# Patient Record
Sex: Male | Born: 1940 | Race: White | Hispanic: No | Smoking: Former smoker
Health system: Southern US, Community
[De-identification: ages and names within clinical notes are randomized; demographics above are authoritative.]

## PROBLEM LIST (undated history)

## (undated) DIAGNOSIS — N289 Disorder of kidney and ureter, unspecified: Secondary | ICD-10-CM

## (undated) DIAGNOSIS — J449 Chronic obstructive pulmonary disease, unspecified: Secondary | ICD-10-CM

## (undated) DIAGNOSIS — E119 Type 2 diabetes mellitus without complications: Secondary | ICD-10-CM

## (undated) DIAGNOSIS — I350 Nonrheumatic aortic (valve) stenosis: Secondary | ICD-10-CM

## (undated) DIAGNOSIS — I639 Cerebral infarction, unspecified: Secondary | ICD-10-CM

## (undated) DIAGNOSIS — I10 Essential (primary) hypertension: Secondary | ICD-10-CM

## (undated) DIAGNOSIS — I480 Paroxysmal atrial fibrillation: Secondary | ICD-10-CM

## (undated) HISTORY — DX: Paroxysmal atrial fibrillation: I48.0

---

## 2013-10-21 ENCOUNTER — Encounter (HOSPITAL_BASED_OUTPATIENT_CLINIC_OR_DEPARTMENT_OTHER): Payer: Self-pay | Admitting: Emergency Medicine

## 2013-10-21 ENCOUNTER — Emergency Department (HOSPITAL_BASED_OUTPATIENT_CLINIC_OR_DEPARTMENT_OTHER): Payer: Medicare Other

## 2013-10-21 ENCOUNTER — Inpatient Hospital Stay (HOSPITAL_BASED_OUTPATIENT_CLINIC_OR_DEPARTMENT_OTHER)
Admission: EM | Admit: 2013-10-21 | Discharge: 2013-10-23 | DRG: 041 | Disposition: A | Discharge status: Home / Self Care | Payer: Medicare Other | Attending: Internal Medicine | Admitting: Internal Medicine

## 2013-10-21 DIAGNOSIS — Q231 Congenital insufficiency of aortic valve: Secondary | ICD-10-CM

## 2013-10-21 DIAGNOSIS — D72829 Elevated white blood cell count, unspecified: Secondary | ICD-10-CM | POA: Diagnosis present

## 2013-10-21 DIAGNOSIS — F17200 Nicotine dependence, unspecified, uncomplicated: Secondary | ICD-10-CM | POA: Diagnosis present

## 2013-10-21 DIAGNOSIS — Q211 Atrial septal defect: Secondary | ICD-10-CM | POA: Diagnosis present

## 2013-10-21 DIAGNOSIS — E119 Type 2 diabetes mellitus without complications: Secondary | ICD-10-CM | POA: Diagnosis present

## 2013-10-21 DIAGNOSIS — I350 Nonrheumatic aortic (valve) stenosis: Secondary | ICD-10-CM

## 2013-10-21 DIAGNOSIS — I359 Nonrheumatic aortic valve disorder, unspecified: Secondary | ICD-10-CM | POA: Diagnosis present

## 2013-10-21 DIAGNOSIS — J4489 Other specified chronic obstructive pulmonary disease: Secondary | ICD-10-CM | POA: Diagnosis present

## 2013-10-21 DIAGNOSIS — R4701 Aphasia: Secondary | ICD-10-CM | POA: Diagnosis present

## 2013-10-21 DIAGNOSIS — R404 Transient alteration of awareness: Secondary | ICD-10-CM | POA: Diagnosis present

## 2013-10-21 DIAGNOSIS — I10 Essential (primary) hypertension: Secondary | ICD-10-CM | POA: Diagnosis present

## 2013-10-21 DIAGNOSIS — I639 Cerebral infarction, unspecified: Secondary | ICD-10-CM | POA: Diagnosis present

## 2013-10-21 DIAGNOSIS — F29 Unspecified psychosis not due to a substance or known physiological condition: Secondary | ICD-10-CM | POA: Diagnosis present

## 2013-10-21 DIAGNOSIS — Q2111 Secundum atrial septal defect: Secondary | ICD-10-CM | POA: Diagnosis present

## 2013-10-21 DIAGNOSIS — I6350 Cerebral infarction due to unspecified occlusion or stenosis of unspecified cerebral artery: Principal | ICD-10-CM | POA: Diagnosis present

## 2013-10-21 DIAGNOSIS — Z9981 Dependence on supplemental oxygen: Secondary | ICD-10-CM

## 2013-10-21 DIAGNOSIS — M542 Cervicalgia: Secondary | ICD-10-CM | POA: Diagnosis present

## 2013-10-21 DIAGNOSIS — Q2112 Patent foramen ovale: Secondary | ICD-10-CM

## 2013-10-21 DIAGNOSIS — J449 Chronic obstructive pulmonary disease, unspecified: Secondary | ICD-10-CM | POA: Diagnosis present

## 2013-10-21 DIAGNOSIS — Q230 Congenital stenosis of aortic valve: Secondary | ICD-10-CM

## 2013-10-21 DIAGNOSIS — R4789 Other speech disturbances: Secondary | ICD-10-CM | POA: Diagnosis present

## 2013-10-21 DIAGNOSIS — Z79899 Other long term (current) drug therapy: Secondary | ICD-10-CM

## 2013-10-21 DIAGNOSIS — Z87891 Personal history of nicotine dependence: Secondary | ICD-10-CM

## 2013-10-21 DIAGNOSIS — Z7982 Long term (current) use of aspirin: Secondary | ICD-10-CM

## 2013-10-21 DIAGNOSIS — E785 Hyperlipidemia, unspecified: Secondary | ICD-10-CM | POA: Diagnosis present

## 2013-10-21 DIAGNOSIS — R51 Headache: Secondary | ICD-10-CM | POA: Diagnosis present

## 2013-10-21 DIAGNOSIS — I253 Aneurysm of heart: Secondary | ICD-10-CM

## 2013-10-21 HISTORY — DX: Cerebral infarction, unspecified: I63.9

## 2013-10-21 HISTORY — DX: Nonrheumatic aortic (valve) stenosis: I35.0

## 2013-10-21 HISTORY — DX: Chronic obstructive pulmonary disease, unspecified: J44.9

## 2013-10-21 HISTORY — DX: Essential (primary) hypertension: I10

## 2013-10-21 HISTORY — DX: Type 2 diabetes mellitus without complications: E11.9

## 2013-10-21 HISTORY — DX: Disorder of kidney and ureter, unspecified: N28.9

## 2013-10-21 LAB — URINE MICROSCOPIC-ADD ON

## 2013-10-21 LAB — URINALYSIS, ROUTINE W REFLEX MICROSCOPIC
Bilirubin Urine: NEGATIVE
GLUCOSE, UA: NEGATIVE mg/dL
HGB URINE DIPSTICK: NEGATIVE
Ketones, ur: NEGATIVE mg/dL
LEUKOCYTES UA: NEGATIVE
Nitrite: NEGATIVE
PROTEIN: 30 mg/dL — AB
Specific Gravity, Urine: 1.022 (ref 1.005–1.030)
Urobilinogen, UA: 0.2 mg/dL (ref 0.0–1.0)
pH: 7 (ref 5.0–8.0)

## 2013-10-21 LAB — CBC WITH DIFFERENTIAL/PLATELET
BASOS PCT: 0 % (ref 0–1)
Basophils Absolute: 0 10*3/uL (ref 0.0–0.1)
Eosinophils Absolute: 0 10*3/uL (ref 0.0–0.7)
Eosinophils Relative: 0 % (ref 0–5)
HCT: 41.9 % (ref 39.0–52.0)
HEMOGLOBIN: 14.3 g/dL (ref 13.0–17.0)
LYMPHS ABS: 1.1 10*3/uL (ref 0.7–4.0)
LYMPHS PCT: 9 % — AB (ref 12–46)
MCH: 30.6 pg (ref 26.0–34.0)
MCHC: 34.1 g/dL (ref 30.0–36.0)
MCV: 89.7 fL (ref 78.0–100.0)
MONOS PCT: 5 % (ref 3–12)
Monocytes Absolute: 0.6 10*3/uL (ref 0.1–1.0)
NEUTROS ABS: 10.7 10*3/uL — AB (ref 1.7–7.7)
Neutrophils Relative %: 86 % — ABNORMAL HIGH (ref 43–77)
Platelets: 157 10*3/uL (ref 150–400)
RBC: 4.67 MIL/uL (ref 4.22–5.81)
RDW: 15 % (ref 11.5–15.5)
WBC: 12.4 10*3/uL — ABNORMAL HIGH (ref 4.0–10.5)

## 2013-10-21 LAB — PROTIME-INR
INR: 0.95 (ref 0.00–1.49)
Prothrombin Time: 12.5 seconds (ref 11.6–15.2)

## 2013-10-21 LAB — COMPREHENSIVE METABOLIC PANEL
ALBUMIN: 3.9 g/dL (ref 3.5–5.2)
ALK PHOS: 79 U/L (ref 39–117)
ALT: 16 U/L (ref 0–53)
AST: 14 U/L (ref 0–37)
BILIRUBIN TOTAL: 0.9 mg/dL (ref 0.3–1.2)
BUN: 12 mg/dL (ref 6–23)
CO2: 26 meq/L (ref 19–32)
Calcium: 9.3 mg/dL (ref 8.4–10.5)
Chloride: 97 mEq/L (ref 96–112)
Creatinine, Ser: 1 mg/dL (ref 0.50–1.35)
GFR calc Af Amer: 83 mL/min — ABNORMAL LOW (ref >90)
GFR calc non Af Amer: 71 mL/min — ABNORMAL LOW (ref >90)
Glucose, Bld: 197 mg/dL — ABNORMAL HIGH (ref 70–99)
POTASSIUM: 4 meq/L (ref 3.7–5.3)
Sodium: 138 mEq/L (ref 137–147)
Total Protein: 7.2 g/dL (ref 6.0–8.3)

## 2013-10-21 LAB — TROPONIN I: Troponin I: <0.3 ng/mL (ref <0.30)

## 2013-10-21 LAB — ETHANOL: Alcohol, Ethyl (B): <11 mg/dL (ref 0–11)

## 2013-10-21 LAB — APTT: APTT: 28 s (ref 24–37)

## 2013-10-21 LAB — GLUCOSE, CAPILLARY
Glucose-Capillary: 177 mg/dL — ABNORMAL HIGH (ref 70–99)
Glucose-Capillary: 179 mg/dL — ABNORMAL HIGH (ref 70–99)

## 2013-10-21 LAB — CREATININE, SERUM
CREATININE: 0.97 mg/dL (ref 0.50–1.35)
GFR calc Af Amer: >90 mL/min (ref >90)
GFR calc non Af Amer: 79 mL/min — ABNORMAL LOW (ref >90)

## 2013-10-21 MED ORDER — MORPHINE SULFATE 4 MG/ML IJ SOLN
4.0000 mg | Freq: Once | INTRAMUSCULAR | Status: AC
  Administered 2013-10-21: 4 mg via INTRAVENOUS
  Filled 2013-10-21: qty 1

## 2013-10-21 MED ORDER — TRAMADOL HCL 50 MG PO TABS
50.0000 mg | ORAL_TABLET | Freq: Four times a day (QID) | ORAL | Status: DC | PRN
Start: 2013-10-21 — End: 2013-10-23
  Administered 2013-10-22 (×2): 50 mg via ORAL
  Filled 2013-10-21 (×2): qty 1

## 2013-10-21 MED ORDER — ASPIRIN 81 MG PO TABS: 81.0000 mg | ORAL_TABLET | Freq: Every day | ORAL | Status: DC

## 2013-10-21 MED ORDER — HYDRALAZINE HCL 20 MG/ML IJ SOLN: 10.0000 mg | Freq: Three times a day (TID) | INTRAMUSCULAR | Status: DC | PRN

## 2013-10-21 MED ORDER — IPRATROPIUM-ALBUTEROL 0.5-2.5 (3) MG/3ML IN SOLN
3.0000 mL | Freq: Once | RESPIRATORY_TRACT | Status: AC
  Administered 2013-10-21: 3 mL via RESPIRATORY_TRACT

## 2013-10-21 MED ORDER — ENOXAPARIN SODIUM 40 MG/0.4ML ~~LOC~~ SOLN
40.0000 mg | SUBCUTANEOUS | Status: DC
  Administered 2013-10-21 – 2013-10-22 (×2): 40 mg via SUBCUTANEOUS
  Filled 2013-10-21 (×3): qty 0.4

## 2013-10-21 MED ORDER — ACETAMINOPHEN 325 MG PO TABS
650.0000 mg | ORAL_TABLET | Freq: Four times a day (QID) | ORAL | Status: DC | PRN
  Administered 2013-10-23: 650 mg via ORAL
  Filled 2013-10-21: qty 2

## 2013-10-21 MED ORDER — IPRATROPIUM-ALBUTEROL 20-100 MCG/ACT IN AERS: 1.0000 | INHALATION_SPRAY | Freq: Four times a day (QID) | RESPIRATORY_TRACT | Status: DC

## 2013-10-21 MED ORDER — NICOTINE 7 MG/24HR TD PT24
7.0000 mg | MEDICATED_PATCH | Freq: Every day | TRANSDERMAL | Status: DC
  Administered 2013-10-21 – 2013-10-22 (×2): 7 mg via TRANSDERMAL
  Filled 2013-10-21 (×2): qty 1

## 2013-10-21 MED ORDER — SENNOSIDES-DOCUSATE SODIUM 8.6-50 MG PO TABS: 1.0000 | ORAL_TABLET | Freq: Every evening | ORAL | Status: DC | PRN

## 2013-10-21 MED ORDER — SERTRALINE HCL 100 MG PO TABS
100.0000 mg | ORAL_TABLET | Freq: Every day | ORAL | Status: DC
  Administered 2013-10-22 – 2013-10-23 (×2): 100 mg via ORAL
  Filled 2013-10-21 (×2): qty 1

## 2013-10-21 MED ORDER — ASPIRIN EC 81 MG PO TBEC
81.0000 mg | DELAYED_RELEASE_TABLET | Freq: Every day | ORAL | Status: DC
  Administered 2013-10-22 – 2013-10-23 (×2): 81 mg via ORAL
  Filled 2013-10-21 (×2): qty 1

## 2013-10-21 MED ORDER — TIOTROPIUM BROMIDE MONOHYDRATE 18 MCG IN CAPS
18.0000 ug | ORAL_CAPSULE | Freq: Every day | RESPIRATORY_TRACT | Status: DC
  Administered 2013-10-22: 18 ug via RESPIRATORY_TRACT
  Filled 2013-10-21: qty 5

## 2013-10-21 MED ORDER — IPRATROPIUM-ALBUTEROL 20-100 MCG/ACT IN AERS: 1.0000 | INHALATION_SPRAY | Freq: Four times a day (QID) | RESPIRATORY_TRACT | Status: DC | PRN

## 2013-10-21 MED ORDER — ONDANSETRON HCL 4 MG/2ML IJ SOLN
4.0000 mg | Freq: Once | INTRAMUSCULAR | Status: AC
  Administered 2013-10-21: 4 mg via INTRAVENOUS

## 2013-10-21 MED ORDER — MOMETASONE FURO-FORMOTEROL FUM 100-5 MCG/ACT IN AERO
2.0000 | INHALATION_SPRAY | Freq: Two times a day (BID) | RESPIRATORY_TRACT | Status: DC
  Administered 2013-10-21 – 2013-10-22 (×3): 2 via RESPIRATORY_TRACT
  Filled 2013-10-21: qty 8.8

## 2013-10-21 MED ORDER — THEOPHYLLINE ER 400 MG PO CP24: 400.0000 mg | ORAL_CAPSULE | Freq: Every day | ORAL | Status: DC

## 2013-10-21 MED ORDER — IPRATROPIUM-ALBUTEROL 0.5-2.5 (3) MG/3ML IN SOLN
RESPIRATORY_TRACT | Status: AC
  Administered 2013-10-21: 3 mL via RESPIRATORY_TRACT
  Filled 2013-10-21: qty 3

## 2013-10-21 MED ORDER — ONDANSETRON HCL 4 MG/2ML IJ SOLN: 4.0000 mg | Freq: Four times a day (QID) | INTRAMUSCULAR | Status: DC | PRN

## 2013-10-21 MED ORDER — INSULIN ASPART 100 UNIT/ML ~~LOC~~ SOLN
0.0000 [IU] | Freq: Three times a day (TID) | SUBCUTANEOUS | Status: DC
  Administered 2013-10-22 (×2): 2 [IU] via SUBCUTANEOUS
  Administered 2013-10-22: 1 [IU] via SUBCUTANEOUS
  Administered 2013-10-23: 2 [IU] via SUBCUTANEOUS

## 2013-10-21 MED ORDER — INSULIN GLARGINE 100 UNIT/ML ~~LOC~~ SOLN
12.0000 [IU] | Freq: Every day | SUBCUTANEOUS | Status: DC
  Administered 2013-10-21: 12 [IU] via SUBCUTANEOUS
  Filled 2013-10-21 (×2): qty 0.12

## 2013-10-21 MED ORDER — ASPIRIN 81 MG PO CHEW
324.0000 mg | CHEWABLE_TABLET | Freq: Once | ORAL | Status: AC
  Administered 2013-10-21: 324 mg via ORAL
  Filled 2013-10-21: qty 4

## 2013-10-21 MED ORDER — ONDANSETRON HCL 4 MG/2ML IJ SOLN
INTRAMUSCULAR | Status: AC
  Filled 2013-10-21: qty 2

## 2013-10-21 MED ORDER — THEOPHYLLINE ER 400 MG PO TB24
400.0000 mg | ORAL_TABLET | Freq: Every day | ORAL | Status: DC
  Administered 2013-10-22 – 2013-10-23 (×2): 400 mg via ORAL
  Filled 2013-10-21 (×2): qty 1

## 2013-10-21 MED ORDER — IPRATROPIUM-ALBUTEROL 0.5-2.5 (3) MG/3ML IN SOLN: 3.0000 mL | Freq: Four times a day (QID) | RESPIRATORY_TRACT | Status: DC | PRN

## 2013-10-21 NOTE — ED Notes (Signed)
Gave patient the Morphine, became nauseous and vomited.  Zofran given, vomiting stopped.  Patient verbalizes feeling much better.  Dr. Ethelda ChickJacubowitz notified.

## 2013-10-21 NOTE — ED Notes (Signed)
Patient lives by himself behind family. Was seen this morning appx 8am and was not making sense, incoherent in his speech. Walking slower than normal, no obvious neuro deficits at this time. Patient alert and oriented.

## 2013-10-21 NOTE — Consult Note (Signed)
Referring Physician: Dr. Rennis Chris    Chief Complaint: Headache with new onset visual changes and transient speech abnormality with confusion.  HPI: Brett Michael is an 73 y.o. male with a history of diabetes mellitus, hypertension and COPD, who was seen in the emergency room at [facility] earlier today with a complaint of headache and neck pain as well as changes in vision and confusion with speech output that was nonsensical. Speech abnormality has resolved and headache has improved. CT scan of the head was obtained which showed a left medial occipital area of hypoattenuation suspicious for subacute stroke. NIH stroke scale was 2. Patient was last known well at 8 PM on 10/20/2013. He's been taking aspirin daily.  LSN: 8 PM on 10/20/2013 tPA Given: No: Beyond time under for treatment consideration MRankin: 1  Past Medical History  Diagnosis Date  . Diabetes mellitus without complication   . COPD (chronic obstructive pulmonary disease)   . Hypertension   . Renal disorder     kidney stones    No family history on file.   Medications: I have reviewed the patient's current medications.  ROS: History obtained from patient, his son and his daughter-in-law  General ROS: negative for - chills, fatigue, fever, night sweats, weight gain or weight loss Psychological ROS: negative for - behavioral disorder, hallucinations, memory difficulties, mood swings or suicidal ideation Ophthalmic ROS: negative for - blurry vision, double vision, eye pain or loss of vision ENT ROS: negative for - epistaxis, nasal discharge, oral lesions, sore throat, tinnitus or vertigo Allergy and Immunology ROS: negative for - hives or itchy/watery eyes Hematological and Lymphatic ROS: negative for - bleeding problems, bruising or swollen lymph nodes Endocrine ROS: negative for - galactorrhea, hair pattern changes, polydipsia/polyuria or temperature intolerance Respiratory ROS: negative for - cough, hemoptysis, shortness of  breath or wheezing Cardiovascular ROS: negative for - chest pain, dyspnea on exertion, edema or irregular heartbeat Gastrointestinal ROS: negative for - abdominal pain, diarrhea, hematemesis, nausea/vomiting or stool incontinence Genito-Urinary ROS: negative for - dysuria, hematuria, incontinence or urinary frequency/urgency Musculoskeletal ROS: negative for - joint swelling or muscular weakness Neurological ROS: as noted in HPI Dermatological ROS: negative for rash and skin lesion changes  Physical Examination: Blood pressure 149/67, pulse 79, temperature 99.5 F (37.5 C), temperature source Rectal, resp. rate 18, height 5\' 6"  (1.676 m), weight 87.091 kg (192 lb), SpO2 91.00%.  Neurologic Examination: Mental Status: Alert, oriented, thought content appropriate.  Speech fluent without evidence of aphasia. Able to follow commands without difficulty. Cranial Nerves: II-right homonymous hemianopsia. III/IV/VI-Pupils were equal and reacted. Extraocular movements were full and conjugate.    V/VII-no facial numbness and no facial weakness. VIII-moderate difficulty with hearing X-normal speech and symmetrical palatal movement. Motor: 5/5 bilaterally with normal tone and bulk Sensory: Normal throughout. Deep Tendon Reflexes: 2+ and symmetric. Plantars: Flexor bilaterally Cerebellar: Normal finger-to-nose testing. Carotid auscultation: Normal  Dg Chest 2 View  10/21/2013   CLINICAL DATA:  Altered mental status  EXAM: CHEST  2 VIEW  COMPARISON:  None.  FINDINGS: There is underlying emphysematous change. There are calcified granulomas on the left. There is no edema or consolidation. Heart is mildly enlarged. The pulmonary vascularity reflects underlying emphysema. No adenopathy. There is atherosclerotic change in the aorta. There is syndesmotic change in the thoracic spine.  IMPRESSION: Underlying emphysematous change. No edema or consolidation. Mild cardiomegaly. Evidence of calcified granulomas on  the left.  There is evidence of syndesmotic change in the thoracic spine. 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   Electronically Signed   By: Bretta BangWilliam  Woodruff M.D.   On: 10/21/2013 14:22   Ct Head Wo Contrast  10/21/2013   CLINICAL DATA:  Altered mental status. Slurred speech. Diabetic with hypertension.  EXAM: CT HEAD WITHOUT CONTRAST  TECHNIQUE: Contiguous axial images were obtained from the base of the skull through the vertex without intravenous contrast.  COMPARISON:  None.  FINDINGS: Sinuses/Soft tissues: Right maxillary sinus mucous retention cyst or polyp. Mild motion degradation. Ethmoid air cell mucosal thickening. Clear mastoid air cells.  Intracranial: Advanced cerebral atrophy. Moderate low density in the periventricular white matter likely related to small vessel disease. Cerebellar atrophy which is also advanced. Cortically based hypoattenuation in the medial left occipital lobe, including on image 15/series 2. No hemorrhage, hydrocephalus, mass lesion, intra-axial, or extra-axial fluid collection.  IMPRESSION: 1. Advanced cerebral atrophy and small vessel ischemic change. 2. Mildly motion degraded exam. 3. Medial left occipital lobe cortically based hypoattenuation is suspicious for subacute infarct. These results were called by telephone at the time of interpretation on 10/21/2013 at 2:18 PM to Dr. Doug SouSAM JACUBOWITZ , who verbally acknowledged these results.   Electronically Signed   By: Jeronimo GreavesKyle  Talbot M.D.   On: 10/21/2013 14:20    Assessment: 73 y.o. male with a history of diabetes mellitus and hypertension presenting with subacute left medial occipital infarction as well as transient expressive aphasia.  Stroke Risk Factors - diabetes mellitus and hypertension  Plan: 1. HgbA1c, fasting lipid panel 2. MRI, MRA  of the brain without contrast 3. PT consult, OT consult, Speech consult 4. Echocardiogram 5. Carotid dopplers 6.  Prophylactic therapy-Antiplatelet med: Aspirin  7. Risk factor modification 8. Telemetry monitoring   C.R. Roseanne RenoStewart, MD [facility] [phone]  10/21/2013, 5:04 PM

## 2013-10-21 NOTE — ED Notes (Signed)
Dr. Ethelda ChickJacubowitz was notified of patient's vomiting episode and that Zofran was effective.  Also, I've requested respiratory therapist to evaluate patient for a nebulized treatment since patient was requesting to use his Combivent inhaler.

## 2013-10-21 NOTE — ED Notes (Signed)
Report given to Paramedic Ashley RoyaltyMatthews.  Ready for transport at this time.  They have assumed care of the patient.

## 2013-10-21 NOTE — ED Notes (Signed)
This RT called report to Kal. RRT at [facility].

## 2013-10-21 NOTE — ED Provider Notes (Signed)
CSN: [id number]     Arrival date & time 10/21/13  1250 History   First MD Initiated Contact with Patient 10/21/13 1331     Chief Complaint  Patient presents with  . Altered Mental Status   (Consider location/radiation/quality/duration/timing/severity/associated sxs/prior Treatment) HPI Patient complains of headache diffuse, neck pain, onset this morning which awakened him sleep the. Treated with Tylenol 8:30 AM today. He is dialyzed states that he was not making sense this morning. No known fever. No other associated symptoms. Nothing makes symptoms better or worse. Headache is moderate to severe present. Past Medical History  Diagnosis Date  . Diabetes mellitus without complication   . COPD (chronic obstructive pulmonary disease)   . Hypertension   . Renal disorder     kidney stones   History reviewed. No pertinent past surgical history. No family history on file. History  Substance Use Topics  . Smoking status: Former Games developer  . Smokeless tobacco: Not on file  . Alcohol Use: No    Review of Systems  Constitutional: Negative.   Respiratory: Negative.   Cardiovascular: Negative.   Gastrointestinal: Negative.   Musculoskeletal: Positive for neck pain.  Skin: Negative.   Neurological: Positive for headaches.  Psychiatric/Behavioral: Negative.   All other systems reviewed and are negative.    Allergies  Review of patient's allergies indicates not on file.  Home Medications   Current Outpatient Rx  Name  Route  Sig  Dispense  Refill  . amLODipine (NORVASC) 10 MG tablet   Oral   Take 10 mg by mouth daily.         Marland Kitchen aspirin 81 MG tablet   Oral   Take 81 mg by mouth daily.         . Fluticasone-Salmeterol (ADVAIR) 250-50 MCG/DOSE AEPB   Inhalation   Inhale 1 puff into the lungs 2 (two) times daily.         . insulin glargine (LANTUS) 100 UNIT/ML injection   Subcutaneous   Inject 12 Units into the skin at bedtime.         . metFORMIN (GLUCOPHAGE) 500 MG  tablet   Oral   Take by mouth 2 (two) times daily with a meal.         . quinapril (ACCUPRIL) 40 MG tablet   Oral   Take 40 mg by mouth at bedtime.         . sertraline (ZOLOFT) 100 MG tablet   Oral   Take 100 mg by mouth daily.         . theophylline (THEO-24) 400 MG 24 hr capsule   Oral   Take 400 mg by mouth daily.          BP 161/79  Pulse 88  Temp(Src) 99.8 F (37.7 C) (Rectal)  Resp 22  Ht 5\' 6"  (1.676 m)  Wt 192 lb (87.091 kg)  BMI 31.00 kg/m2  SpO2 93% Physical Exam  Nursing note and vitals reviewed. Constitutional: He is oriented to person, place, and time. He appears well-developed and well-nourished. No distress.  HENT:  Head: Normocephalic and atraumatic.  Eyes: Conjunctivae are normal. Pupils are equal, round, and reactive to light.  Neck: Neck supple. No tracheal deviation present. No thyromegaly present.  No signs of meningitis  Cardiovascular: Normal rate, regular rhythm and normal heart sounds.   No murmur heard. Pulmonary/Chest: Effort normal and breath sounds normal.  Abdominal: Soft. Bowel sounds are normal. He exhibits no distension. There is no tenderness.  Obese  Musculoskeletal: Normal range of motion. He exhibits no edema and no tenderness.  Neurological: He is alert and oriented to person, place, and time. He has normal reflexes. No cranial nerve deficit. Coordination normal.  Motor strength 5 over 5 overall her to shin normal DTRs symmetric bilaterally knee jerk ankle jerk and biceps his or her bilaterally.  Skin: Skin is warm and dry. No rash noted.  Psychiatric: He has a normal mood and affect. Thought content normal.    ED Course  Procedures (including critical care time) Labs Review Labs Reviewed  CBC WITH DIFFERENTIAL - Abnormal; Notable for the following:    WBC 12.4 (*)    Neutrophils Relative % 86 (*)    Neutro Abs 10.7 (*)    Lymphocytes Relative 9 (*)    All other components within normal limits  COMPREHENSIVE  METABOLIC PANEL  URINALYSIS, ROUTINE W REFLEX MICROSCOPIC  TROPONIN I   Imaging Review No results found.  EKG Interpretation    Date/Time:  Sunday October 21 2013 13:46:50 EST Ventricular Rate:  81 PR Interval:  170 QRS Duration: 86 QT Interval:  384 QTC Calculation: 446 R Axis:   -7 Text Interpretation:  Normal sinus rhythm Normal ECG No old tracing to compare Confirmed by Ethelda ChickJACUBOWITZ  MD, Kanoa Phillippi ([id number]) on 10/21/2013 2:17:26 PM           Results for orders placed during the hospital encounter of 10/21/13  CBC WITH DIFFERENTIAL      Result Value Range   WBC 12.4 (*) 4.0 - 10.5 K/uL   RBC 4.67  4.22 - 5.81 MIL/uL   Hemoglobin 14.3  13.0 - 17.0 g/dL   HCT 16.141.9  09.639.0 - 04.552.0 %   MCV 89.7  78.0 - 100.0 fL   MCH 30.6  26.0 - 34.0 pg   MCHC 34.1  30.0 - 36.0 g/dL   RDW 40.915.0  81.111.5 - 91.415.5 %   Platelets 157  150 - 400 K/uL   Neutrophils Relative % 86 (*) 43 - 77 %   Neutro Abs 10.7 (*) 1.7 - 7.7 K/uL   Lymphocytes Relative 9 (*) 12 - 46 %   Lymphs Abs 1.1  0.7 - 4.0 K/uL   Monocytes Relative 5  3 - 12 %   Monocytes Absolute 0.6  0.1 - 1.0 K/uL   Eosinophils Relative 0  0 - 5 %   Eosinophils Absolute 0.0  0.0 - 0.7 K/uL   Basophils Relative 0  0 - 1 %   Basophils Absolute 0.0  0.0 - 0.1 K/uL  COMPREHENSIVE METABOLIC PANEL      Result Value Range   Sodium 138  137 - 147 mEq/L   Potassium 4.0  3.7 - 5.3 mEq/L   Chloride 97  96 - 112 mEq/L   CO2 26  19 - 32 mEq/L   Glucose, Bld 197 (*) 70 - 99 mg/dL   BUN 12  6 - 23 mg/dL   Creatinine, Ser 7.821.00  0.50 - 1.35 mg/dL   Calcium 9.3  8.4 - 95.610.5 mg/dL   Total Protein 7.2  6.0 - 8.3 g/dL   Albumin 3.9  3.5 - 5.2 g/dL   AST 14  0 - 37 U/L   ALT 16  0 - 53 U/L   Alkaline Phosphatase 79  39 - 117 U/L   Total Bilirubin 0.9  0.3 - 1.2 mg/dL   GFR calc non Af Amer 71 (*) >90 mL/min   GFR calc Af Amer 83 (*) >  90 mL/min  TROPONIN I      Result Value Range   Troponin I <0.30  <0.30 ng/mL   Dg Chest 2 View  10/21/2013   CLINICAL DATA:   Altered mental status  EXAM: CHEST  2 VIEW  COMPARISON:  None.  FINDINGS: There is underlying emphysematous change. There are calcified granulomas on the left. There is no edema or consolidation. Heart is mildly enlarged. The pulmonary vascularity reflects underlying emphysema. No adenopathy. There is atherosclerotic change in the aorta. There is syndesmotic change in the thoracic spine.  IMPRESSION: Underlying emphysematous change. No edema or consolidation. Mild cardiomegaly. Evidence of calcified granulomas on the left.  There is evidence of syndesmotic change in the thoracic spine. 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   Electronically Signed   By: Bretta Bang M.D.   On: 10/21/2013 14:22   Ct Head Wo Contrast  10/21/2013   CLINICAL DATA:  Altered mental status. Slurred speech. Diabetic with hypertension.  EXAM: CT HEAD WITHOUT CONTRAST  TECHNIQUE: Contiguous axial images were obtained from the base of the skull through the vertex without intravenous contrast.  COMPARISON:  None.  FINDINGS: Sinuses/Soft tissues: Right maxillary sinus mucous retention cyst or polyp. Mild motion degradation. Ethmoid air cell mucosal thickening. Clear mastoid air cells.  Intracranial: Advanced cerebral atrophy. Moderate low density in the periventricular white matter likely related to small vessel disease. Cerebellar atrophy which is also advanced. Cortically based hypoattenuation in the medial left occipital lobe, including on image 15/series 2. No hemorrhage, hydrocephalus, mass lesion, intra-axial, or extra-axial fluid collection.  IMPRESSION: 1. Advanced cerebral atrophy and small vessel ischemic change. 2. Mildly motion degraded exam. 3. Medial left occipital lobe cortically based hypoattenuation is suspicious for subacute infarct. These results were called by telephone at the time of interpretation on 10/21/2013 at 2:18 PM to Dr. Doug Sou , who verbally  acknowledged these results.   Electronically Signed   By: Jeronimo Greaves M.D.   On: 10/21/2013 14:20    MDM  No diagnosis found. CT scan discussed with Dr. Reche Dixon. Patient requires MRI scan to further delineate CT findings. Code stroke not called as patient awakened with symptoms. Discussed with Dr.Vega who accepts the patient transfer . Aspirin ordered. Spoke with Dr. Roseanne Reno, neurology who will see patient in consultation Diagnosis #1stroke #2 hyperglycemia      Doug Sou, MD 10/21/13 1511

## 2013-10-21 NOTE — H&P (Signed)
[facility] History and Physical  Brett Michael WRU:[id number] DOB: 02/13/1941 DOA: 10/21/2013  Referring physician: Dr Ethelda Chick.  PCP: Orland Penman, MD   Chief Complaint: Headache, neck pain, confusion.    HPI: Brett Michael is a 73 y.o. male with PMH significant for COPD on home oxygen 3 L, Diabetes, hypertension who presents to ED complaining of headache, neck pain. Per family patient wake up around 8 am not feeling well, he was complaining of headache and left side neck pain, sharp in quality, 8/10. Family also notice that patient was confuse, talking no sense. No slurred speech, or vision changes. No history of fever. Patient appears to be back to baseline. Headache is better after pain medications.  He denies focal weakness, vision changes, chest pain, dyspnea, abdominal pain.  Patient had an episode of vomiting in the ED.  Evaluation in the ED ; CT head:  Advanced cerebral atrophy and small vessel ischemic change.  Mildly motion degraded exam. Medial left occipital lobe cortically based hypoattenuation is suspicious for subacute infarct.   Review of Systems:  Negative except as per HPI.   Past Medical History  Diagnosis Date  . Diabetes mellitus without complication   . COPD (chronic obstructive pulmonary disease)   . Hypertension   . Renal disorder     kidney stones   History reviewed. No pertinent past surgical history. Social History:  reports that he has quit smoking. He does not have any smokeless tobacco history on file. He reports that he does not drink alcohol. His drug history is not on file.  No Known Allergies  Family History: Father; MI, Mother diabetes.   Prior to Admission medications   Medication Sig Start Date End Date Taking? Authorizing Provider  amLODipine (NORVASC) 10 MG tablet Take 10 mg by mouth daily.   Yes Historical Provider, MD  aspirin 81 MG tablet Take 81 mg by mouth daily.   Yes Historical Provider, MD   Fluticasone-Salmeterol (ADVAIR) 250-50 MCG/DOSE AEPB Inhale 1 puff into the lungs 2 (two) times daily.   Yes Historical Provider, MD  insulin glargine (LANTUS) 100 UNIT/ML injection Inject 12 Units into the skin at bedtime.   Yes Historical Provider, MD  metFORMIN (GLUCOPHAGE) 500 MG tablet Take by mouth 2 (two) times daily with a meal.   Yes Historical Provider, MD  quinapril (ACCUPRIL) 40 MG tablet Take 40 mg by mouth at bedtime.   Yes Historical Provider, MD  sertraline (ZOLOFT) 100 MG tablet Take 100 mg by mouth daily.   Yes Historical Provider, MD  theophylline (THEO-24) 400 MG 24 hr capsule Take 400 mg by mouth daily.   Yes Historical Provider, MD   Physical Exam: Filed Vitals:   10/21/13 1515  BP: 135/64  Pulse: 83  Temp:   Resp: 16    BP 135/64  Pulse 83  Temp(Src) 99.5 F (37.5 C) (Rectal)  Resp 16  Ht 5\' 6"  (1.676 m)  Wt 87.091 kg (192 lb)  BMI 31.00 kg/m2  SpO2 88%  General:  Appears calm and comfortable Eyes: PERRL, normal lids, irises & conjunctiva ENT: grossly normal hearing, lips & tongue Neck: no LAD, masses or thyromegaly Cardiovascular: RRR, no m/r/g. No LE edema. Telemetry: SR, no arrhythmias  Respiratory: CTA bilaterally, no w/r/r. Normal respiratory effort. Abdomen: soft, ntnd Skin: no rash or induration seen on limited exam Musculoskeletal: grossly normal tone BUE/BLE Psychiatric: grossly normal mood and affect, speech fluent and appropriate Neurologic: grossly non-focal. Alert , oriented to place situation, time. Motor strength  5/5.           Labs on Admission:  Basic Metabolic Panel:  Recent Labs Lab 10/21/13 1330  NA 138  K 4.0  CL 97  CO2 26  GLUCOSE 197*  BUN 12  CREATININE 1.00  CALCIUM 9.3   Liver Function Tests:  Recent Labs Lab 10/21/13 1330  AST 14  ALT 16  ALKPHOS 79  BILITOT 0.9  PROT 7.2  ALBUMIN 3.9   No results found for this basename: LIPASE, AMYLASE,  in the last 168 hours No results found for this basename:  AMMONIA,  in the last 168 hours CBC:  Recent Labs Lab 10/21/13 1330  WBC 12.4*  NEUTROABS 10.7*  HGB 14.3  HCT 41.9  MCV 89.7  PLT 157   Cardiac Enzymes:  Recent Labs Lab 10/21/13 1330  TROPONINI <0.30    BNP (last 3 results) No results found for this basename: PROBNP,  in the last 8760 hours CBG:  Recent Labs Lab 10/21/13 1629  GLUCAP 177*    Radiological Exams on Admission: Dg Chest 2 View  10/21/2013   CLINICAL DATA:  Altered mental status  EXAM: CHEST  2 VIEW  COMPARISON:  None.  FINDINGS: There is underlying emphysematous change. There are calcified granulomas on the left. There is no edema or consolidation. Heart is mildly enlarged. The pulmonary vascularity reflects underlying emphysema. No adenopathy. There is atherosclerotic change in the aorta. There is syndesmotic change in the thoracic spine.  IMPRESSION: Underlying emphysematous change. No edema or consolidation. Mild cardiomegaly. Evidence of calcified granulomas on the left.  There is evidence of syndesmotic change in the thoracic spine. 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   Electronically Signed   By: Bretta BangWilliam  Woodruff M.D.   On: 10/21/2013 14:22   Ct Head Wo Contrast  10/21/2013   CLINICAL DATA:  Altered mental status. Slurred speech. Diabetic with hypertension.  EXAM: CT HEAD WITHOUT CONTRAST  TECHNIQUE: Contiguous axial images were obtained from the base of the skull through the vertex without intravenous contrast.  COMPARISON:  None.  FINDINGS: Sinuses/Soft tissues: Right maxillary sinus mucous retention cyst or polyp. Mild motion degradation. Ethmoid air cell mucosal thickening. Clear mastoid air cells.  Intracranial: Advanced cerebral atrophy. Moderate low density in the periventricular white matter likely related to small vessel disease. Cerebellar atrophy which is also advanced. Cortically based hypoattenuation in the medial left occipital lobe,  including on image 15/series 2. No hemorrhage, hydrocephalus, mass lesion, intra-axial, or extra-axial fluid collection.  IMPRESSION: 1. Advanced cerebral atrophy and small vessel ischemic change. 2. Mildly motion degraded exam. 3. Medial left occipital lobe cortically based hypoattenuation is suspicious for subacute infarct. These results were called by telephone at the time of interpretation on 10/21/2013 at 2:18 PM to Dr. Doug SouSAM JACUBOWITZ , who verbally acknowledged these results.   Electronically Signed   By: Jeronimo GreavesKyle  Talbot M.D.   On: 10/21/2013 14:20    EKG: Independently reviewed. Normal Sinus rhythm.   Assessment/Plan Active Problems:   Stroke  1-Headache, neck pain, confusion, aphasia: likely secondary to Stroke. No fever. CT head with hypoattenuation suspicious for subacute infarct.  -Admit to telemetry. Stroke work up. -MRI, MRA, Lipid panel, HB A1c, Carotid doppler, ECHO.  -PT, OT speech consult.  -Continue with aspirin.   2-COPD: continue with combivent PRN. Continue with Advair, spiriva. On home oxygen 3 L.  3-Hypertension: permissive HTN in setting of acute stroke. PRN hydralazine. Could consider start Norvasc on 2-09.  4-Diabetes; hold metformin while  inpatient. Continue with Lantus and SSI.  5-DVT prophylaxis; Lovenox.  6-Mild leukocytosis; likely reactive.    Code Status:  Family Communication: Care discussed with multiple family members at bedside.  Disposition Plan: expect 2 to 3 days inpatient.   Time spent: 75 minutes.   [facility] Pager [phone]

## 2013-10-21 NOTE — ED Notes (Signed)
[facility], RN aware.

## 2013-10-22 ENCOUNTER — Inpatient Hospital Stay (HOSPITAL_COMMUNITY): Payer: Medicare Other

## 2013-10-22 DIAGNOSIS — J449 Chronic obstructive pulmonary disease, unspecified: Secondary | ICD-10-CM

## 2013-10-22 DIAGNOSIS — I6340 Cerebral infarction due to embolism of unspecified cerebral artery: Secondary | ICD-10-CM

## 2013-10-22 DIAGNOSIS — I639 Cerebral infarction, unspecified: Secondary | ICD-10-CM | POA: Diagnosis present

## 2013-10-22 DIAGNOSIS — F17200 Nicotine dependence, unspecified, uncomplicated: Secondary | ICD-10-CM | POA: Diagnosis present

## 2013-10-22 DIAGNOSIS — I359 Nonrheumatic aortic valve disorder, unspecified: Secondary | ICD-10-CM

## 2013-10-22 LAB — CBC
HCT: 42.8 % (ref 39.0–52.0)
Hemoglobin: 14.7 g/dL (ref 13.0–17.0)
MCH: 30.8 pg (ref 26.0–34.0)
MCHC: 34.3 g/dL (ref 30.0–36.0)
MCV: 89.5 fL (ref 78.0–100.0)
PLATELETS: 135 10*3/uL — AB (ref 150–400)
RBC: 4.78 MIL/uL (ref 4.22–5.81)
RDW: 14.9 % (ref 11.5–15.5)
WBC: 10.4 10*3/uL (ref 4.0–10.5)

## 2013-10-22 LAB — BASIC METABOLIC PANEL
BUN: 15 mg/dL (ref 6–23)
CO2: 25 meq/L (ref 19–32)
Calcium: 9.4 mg/dL (ref 8.4–10.5)
Chloride: 100 mEq/L (ref 96–112)
Creatinine, Ser: 1.03 mg/dL (ref 0.50–1.35)
GFR calc Af Amer: 80 mL/min — ABNORMAL LOW (ref >90)
GFR calc non Af Amer: 69 mL/min — ABNORMAL LOW (ref >90)
Glucose, Bld: 164 mg/dL — ABNORMAL HIGH (ref 70–99)
POTASSIUM: 4.1 meq/L (ref 3.7–5.3)
SODIUM: 139 meq/L (ref 137–147)

## 2013-10-22 LAB — LIPID PANEL
CHOLESTEROL: 180 mg/dL (ref 0–200)
HDL: 35 mg/dL — ABNORMAL LOW (ref >39)
LDL Cholesterol: 110 mg/dL — ABNORMAL HIGH (ref 0–99)
Total CHOL/HDL Ratio: 5.1 RATIO
Triglycerides: 174 mg/dL — ABNORMAL HIGH (ref <150)
VLDL: 35 mg/dL (ref 0–40)

## 2013-10-22 LAB — TSH: TSH: 2.693 u[IU]/mL (ref 0.350–4.500)

## 2013-10-22 LAB — GLUCOSE, CAPILLARY
GLUCOSE-CAPILLARY: 141 mg/dL — AB (ref 70–99)
GLUCOSE-CAPILLARY: 160 mg/dL — AB (ref 70–99)
GLUCOSE-CAPILLARY: 161 mg/dL — AB (ref 70–99)
Glucose-Capillary: 219 mg/dL — ABNORMAL HIGH (ref 70–99)

## 2013-10-22 LAB — HEMOGLOBIN A1C
Hgb A1c MFr Bld: 6.7 % — ABNORMAL HIGH (ref <5.7)
MEAN PLASMA GLUCOSE: 146 mg/dL — AB (ref <117)

## 2013-10-22 MED ORDER — NICOTINE 14 MG/24HR TD PT24
14.0000 mg | MEDICATED_PATCH | Freq: Every day | TRANSDERMAL | Status: DC
  Administered 2013-10-22: 14 mg via TRANSDERMAL
  Filled 2013-10-22 (×2): qty 1

## 2013-10-22 MED ORDER — INSULIN GLARGINE 100 UNIT/ML ~~LOC~~ SOLN
12.0000 [IU] | SUBCUTANEOUS | Status: DC
  Filled 2013-10-22 (×2): qty 0.12

## 2013-10-22 MED ORDER — INSULIN GLARGINE 100 UNIT/ML ~~LOC~~ SOLN
6.0000 [IU] | Freq: Once | SUBCUTANEOUS | Status: AC
  Administered 2013-10-22: 6 [IU] via SUBCUTANEOUS
  Filled 2013-10-22: qty 0.06

## 2013-10-22 NOTE — Evaluation (Signed)
Physical Therapy Evaluation Patient Details Name: Brett Michael MRN: [id number] DOB: March 22, 1941 Today's Date: 10/22/2013 Time: 4098-1191 PT Time Calculation (min): 27 min  PT Assessment / Plan / Recommendation History of Present Illness  Headache, neck pain, confusion. MRI:  left occipital pole infarct  Clinical Impression  Pt mobility seems to be near baseline, however noted that pt's O2 sats decreased to 71% on 3L O2 during ambulation.  Pt placed on 4L O2 needing 5 mins to recover to 92%.  Ambulated again on 4L O2 with sats decreasing to 79%.  Pt telling this PT he often desats to 60's at home.  RN made aware of sats.  Will continue to follow.      PT Assessment  Patient needs continued PT services    Follow Up Recommendations  Home health PT;Supervision - Intermittent    Does the patient have the potential to tolerate intense rehabilitation      Barriers to Discharge        Equipment Recommendations  None recommended by PT    Recommendations for Other Services     Frequency Min 4X/week    Precautions / Restrictions Precautions Precautions: Fall Precaution Comments: Desats with minimal activity Restrictions Weight Bearing Restrictions: No   Pertinent Vitals/Pain Denied pain.  See comments on treatment session for O2 sats.        Mobility  Transfers Overall transfer level: Needs assistance Equipment used: None Transfers: Sit to/from Stand Sit to Stand: Supervision Ambulation/Gait Ambulation/Gait assistance: Supervision Ambulation Distance (Feet): 150 Feet (and 120) Assistive device: None Gait Pattern/deviations: Step-through pattern;Decreased stride length General Gait Details: pt moving well, but desats with ambualtion.  After 150' sats 71% on 3L O2.  pt placed on 4L O2 and able to recover up to 92% after sitting ~56mins.  Amb 120' on  4L O2 with sats decreased to 79%.  pt indicates he often desats to 60's at home on 3-3 1/2L O2.   Stairs: Yes Stairs assistance:  Supervision Stair Management: Two rails;Forwards Number of Stairs: 2 General stair comments: pt utilizes hand rails for support.   Modified Rankin (Stroke Patients Only) Pre-Morbid Rankin Score: Slight disability Modified Rankin: Moderately severe disability    Exercises     PT Diagnosis: Difficulty walking  PT Problem List: Decreased activity tolerance;Decreased balance;Decreased strength;Decreased mobility;Decreased cognition PT Treatment Interventions: DME instruction;Gait training;Stair training;Functional mobility training;Therapeutic activities;Therapeutic exercise;Balance training;Neuromuscular re-education;Patient/family education     PT Goals(Current goals can be found in the care plan section) Acute Rehab PT Goals Patient Stated Goal: Home PT Goal Formulation: With patient Time For Goal Achievement: 11/05/13 Potential to Achieve Goals: Good  Visit Information  Last PT Received On: 10/22/13 Assistance Needed: +1 History of Present Illness: Headache, neck pain, confusion. MRI:  left occipital pole infarct       Prior Functioning  Home Living Family/patient expects to be discharged to:: Private residence Living Arrangements: Alone Available Help at Discharge: Family;Available PRN/intermittently (live right in front of him (son and daughter-in-law)) Type of Home: House Home Access: Stairs to enter [employer] of Steps: 4 Entrance Stairs-Rails: Right;Left;Can reach both Home Layout: One level Home Equipment: Walker - 2 wheels;Cane - single point;Grab bars - tub/shower;Shower seat;Hand held shower head Prior Function Level of Independence: Independent Communication Communication: HOH Dominant Hand: Right    Cognition  Cognition Arousal/Alertness: Awake/alert Behavior During Therapy: WFL for tasks assessed/performed Overall Cognitive Status: Within Functional Limits for tasks assessed (7th grade level of education--poor reader)    Extremity/Trunk  Assessment Upper  Extremity Assessment Upper Extremity Assessment: Defer to OT evaluation Lower Extremity Assessment Lower Extremity Assessment: Generalized weakness   Balance Balance Overall balance assessment: Needs assistance Standing balance support: No upper extremity supported Standing balance-Leahy Scale: Fair  End of Session PT - End of Session Equipment Utilized During Treatment: Gait belt;Oxygen Activity Tolerance: Patient limited by fatigue Patient left: in chair;with call bell/phone within reach;with family/visitor present Nurse Communication: Mobility status (O2 sats)  GP     Annalaura Sauseda, Alison MurrayMegan F, PT [phone] 10/22/2013, 1:41 PM

## 2013-10-22 NOTE — Progress Notes (Signed)
Per PT, during session, pt sats dropped down into the 70's repeatedly with ambulation while on 3L via [place].  Pt reports that this is normal for him and that he drops down into the 60's at home with activity while on 3L [place].  Pt placed on 4 L via  after therapy session, sats now in the 90's.  Will monitor O2.

## 2013-10-22 NOTE — Progress Notes (Signed)
Echo Lab  2D Echocardiogram completed.  Brett Michael, RDCS 10/22/2013 8:46 AM

## 2013-10-22 NOTE — Progress Notes (Signed)
PT Cancellation Note  Patient Details Name: Brett Michael MRN: [id number] DOB: Oct 05, 1940   Cancelled Treatment:    Reason Eval/Treat Not Completed: Patient at procedure or test/unavailable.  Will try back another time.     Chanise Habeck, Alison MurrayMegan F 10/22/2013, 9:38 AM

## 2013-10-22 NOTE — Progress Notes (Signed)
UR complete.  Tra Wilemon RN, MSN 

## 2013-10-22 NOTE — Progress Notes (Addendum)
Stroke Team Progress Note  HISTORY Brett Michael is an 73 y.o. male with a history of diabetes mellitus, hypertension and COPD, who was [facility] today 10/21/2013 with a complaint of headache and neck pain as well as changes in vision and confusion with speech output that was nonsensical. Speech abnormality has resolved and headache has improved. CT scan of the head was obtained which showed a left medial occipital area of hypoattenuation suspicious for subacute stroke. NIH stroke scale was 2. Patient was last known well at 8 PM on 10/20/2013. He's been taking aspirin daily. He was beyond time for tPA treatment consideration. He was admitted for further evaluation and treatment.  SUBJECTIVE His daughter-in-law is at the bedside.  Overall he feels his condition is stable. He is sitting on the edge of the bed; working with PT.  OBJECTIVE Most recent Vital Signs: Filed Vitals:   10/22/13 0351 10/22/13 1012 10/22/13 1015 10/22/13 1020  BP: 138/63 168/81    Pulse: 72 73    Temp: 98 F (36.7 C) 97.5 F (36.4 C)    TempSrc: Oral Oral    Resp: 20 20    Height:      Weight:      SpO2: 93% 88% 91% 91%   CBG (last 3)   Recent Labs  10/21/13 1629 10/21/13 2106 10/22/13 0652  GLUCAP 177* 179* 141*    IV Fluid Intake:     MEDICATIONS  . aspirin EC  81 mg Oral Daily  . enoxaparin (LOVENOX) injection  40 mg Subcutaneous Q24H  . insulin aspart  0-9 Units Subcutaneous TID WC  . insulin glargine  12 Units Subcutaneous QHS  . mometasone-formoterol  2 puff Inhalation BID  . nicotine  7 mg Transdermal Daily  . sertraline  100 mg Oral Daily  . theophylline  400 mg Oral Daily  . tiotropium  18 mcg Inhalation Daily   PRN:  acetaminophen, hydrALAZINE, ipratropium-albuterol, ondansetron (ZOFRAN) IV, senna-docusate, traMADol  Diet:  Carb Control thin liquids Activity:  OOB with assistance DVT Prophylaxis:  Lovenox 40 mg sq daily   CLINICALLY SIGNIFICANT  STUDIES Basic Metabolic Panel:  Recent Labs Lab 10/21/13 1330 10/21/13 1840  NA 138  --   K 4.0  --   CL 97  --   CO2 26  --   GLUCOSE 197*  --   BUN 12  --   CREATININE 1.00 0.97  CALCIUM 9.3  --    Liver Function Tests:  Recent Labs Lab 10/21/13 1330  AST 14  ALT 16  ALKPHOS 79  BILITOT 0.9  PROT 7.2  ALBUMIN 3.9   CBC:  Recent Labs Lab 10/21/13 1330  WBC 12.4*  NEUTROABS 10.7*  HGB 14.3  HCT 41.9  MCV 89.7  PLT 157   Coagulation:  Recent Labs Lab 10/21/13 1330  LABPROT 12.5  INR 0.95   Cardiac Enzymes:  Recent Labs Lab 10/21/13 1330  TROPONINI <0.30   Urinalysis:  Recent Labs Lab 10/21/13 1748  COLORURINE YELLOW  LABSPEC 1.022  PHURINE 7.0  GLUCOSEU NEGATIVE  HGBUR NEGATIVE  BILIRUBINUR NEGATIVE  KETONESUR NEGATIVE  PROTEINUR 30*  UROBILINOGEN 0.2  NITRITE NEGATIVE  LEUKOCYTESUR NEGATIVE   Lipid Panel No results found for this basename: chol, trig, hdl, cholhdl, vldl, ldlcalc   HgbA1C  No results found for this basename: HGBA1C    Urine Drug Screen:   No results found for this basename: labopia, cocainscrnur, labbenz, amphetmu, thcu, labbarb  Alcohol Level:  Recent Labs Lab 10/21/13 1330  ETH <11     CT of the brain  10/21/2013    1. Advanced cerebral atrophy and small vessel ischemic change. 2. Mildly motion degraded exam. 3. Medial left occipital lobe cortically based hypoattenuation is suspicious for subacute infarct.  MRI of the brain  10/22/2013    1. Signal abnormality involving the left occipital pole most compatible with acute infarct. Cytotoxic edema but no significant mass effect. No intracranial hemorrhage. 2. Superimposed cerebral white matter signal changes, most commonly due to chronic small vessel disease.    MRA of the brain  10/22/2013   Intracranial MRA suggesting hyperdynamic left PCA and posterior left MCA flow, such as due to luxury perfusion phenomena. Dolichoectatic dominant distal left vertebral artery.    2D Echocardiogram  EF 50-55% with no source of embolus.   Carotid Doppler  No evidence of hemodynamically significant internal carotid artery stenosis. Vertebral artery flow is antegrade.   CXR  10/21/2013    Underlying emphysematous change. No edema or consolidation. Mild cardiomegaly. Evidence of calcified granulomas on the left.  There is evidence of syndesmotic change in the thoracic spine. 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     EKG  normal EKG, normal sinus rhythm. For complete results please see formal report.   Therapy Recommendations   Physical Exam   Pleasant elderly male not in distress.Awake alert. Afebrile. Head is nontraumatic. Neck is supple without bruit. Hearing is normal. Cardiac exam no murmur or gallop. Lungs are clear to auscultation. Distal pulses are well felt. Neurological Exam ;  Awake  Alert oriented x 3. Normal speech and language.eye movements full without nystagmus.fundi were not visualized. Vision acuity  appear normal.Fields show partial right heminaopsia. Hearing is normal. Palatal movements are normal. Face symmetric. Tongue midline. Normal strength, tone, reflexes and coordination. Normal sensation. Gait deferred. ASSESSMENT Mr. Brett Michael is a 73 y.o. male presenting with headache with new onset visual changes and transient speech abnormality with confusion. 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 Infarct felt to be embolic secondary to unknown source.  On aspirin 81 mg orally every day prior to admission. Now on aspirin 81 mg orally every day for secondary stroke prevention. Work up underway.  Diabetes, HgbA1c pending, goal < 7.0 hypertension COPD  Hospital day # 1  TREATMENT/PLAN  Continue aspirin 81 mg orally every day for secondary stroke prevention.  F/u lipids and HgbA1c  Therapy evals TEE to look for embolic source. Arranged with [facility] for  tomorrow.  If positive for PFO (patent foramen ovale), check bilateral lower extremity venous dopplers to rule out DVT as possible source of stroke. (I have made patient NPO after midnight tonight). If TEE negative, a [facility] electrophysiologist will consult and consider placement of an place implantable loop recorder to evaluate for atrial fibrillation as etiology of stroke. This has been explained to patient/family by Dr. Pearlean Brownie and they are agreeable.   Annie Main, MSN, RN, ANVP-BC, ANP-BC, [facility] Pager: [phone] 10/22/2013 10:32 AM  I have personally obtained a history, examined the patient, evaluated imaging results, and formulated the assessment and plan of care. I agree with the above. Delia Heady, MD

## 2013-10-22 NOTE — Evaluation (Signed)
Speech Language Pathology Evaluation Patient Details Name: Brett Michael MRN: [id number] DOB: 09/12/1941 Today's Date: 10/22/2013 Time: 1130-1150 SLP Time Calculation (min): 20 min  Problem List:  Patient Active Problem List   Diagnosis Date Noted  . Stroke 10/21/2013  . Unspecified essential hypertension 10/21/2013  . Type II or unspecified type diabetes mellitus without mention of complication, not stated as uncontrolled 10/21/2013  . COPD (chronic obstructive pulmonary disease) 10/21/2013   Past Medical History:  Past Medical History  Diagnosis Date  . Diabetes mellitus without complication   . COPD (chronic obstructive pulmonary disease)   . Hypertension   . Renal disorder     kidney stones   Past Surgical History: History reviewed. No pertinent past surgical history. HPI:  73 y.o. male with PMH significant for COPD on home oxygen 3 L, Diabetes, hypertension who presents to ED complaining of headache, neck pain. Per family patient wake up around 8 am not feeling well, he was complaining of headache and left side neck pain, sharp in quality, 8/10. Family also notice that patient was confuse, talking no sense. No slurred speech, or vision changes. No history of fever. Patient appears to be back to baseline. Headache is better after pain medications. CT head: Advanced cerebral atrophy and small vessel ischemic change. MRI signal abnormality involving the left occipital pole most compatible with acute infarct   Assessment / Plan / Recommendation Clinical Impression  Pt. exhibits functional cognition for verbal problem solving for home hypothetical situations, working memory, attention.  Possibly decreased anticipatory awareness and prospective memory.  He lives in a cabin in his son's backyard with assistance for management of meds (daughter-in-law fills pill box), appointments (uses calendar).  Pt. cooks using stove and microwave.   ST feels that he has appropriate assist and support  from family who is in very close proximity and recommend frequent checks on pt.  No ST recommended at this time.     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[employer])   Cognition  Overall Cognitive Status: Within Functional Limits for tasks assessed (7th grade level of education--poor reader) Orientation Level: Oriented X4 Memory: Impaired    Comprehension  Auditory Comprehension Overall Auditory Comprehension: Appears within functional limits for tasks assessed Visual Recognition/Discrimination Discrimination: Not tested Reading Comprehension Reading Status: Not tested    Expression Expression Primary Mode of Expression: Verbal Verbal Expression Overall Verbal Expression: Appears within functional limits for tasks assessed Written Expression Dominant Hand: Right   Oral / Motor Oral Motor/Sensory Function Overall Oral Motor/Sensory Function: Appears within functional limits for tasks assessed Motor Speech Overall Motor Speech: Appears within functional limits for tasks assessed        Royce MacadamiaLisa Willis Amaliya Whitelaw M.Ed [employer]SLP Pager [phone]  10/22/2013

## 2013-10-22 NOTE — Progress Notes (Signed)
The pt is for TEE 8:30 am with Dr Rennis GoldenHilty. I discussed risks and benefits of the procedure with the pt and family and they are in agreement. Orders written. 1/2 dose Lantus tonight.   Corine ShelterLUKE Onelia Cadmus PA-C 10/22/2013 4:31 PM

## 2013-10-22 NOTE — Evaluation (Signed)
Occupational Therapy Evaluation Patient Details Name: Brett Michael MRN: [id number] DOB: 04/25/1941 Today's Date: 10/22/2013 Time: 1019-1100 OT Time Calculation (min): 41 min  OT Assessment / Plan / Recommendation History of present illness Headache, neck pain, confusion. MRI:  left occipital pole infarct   Clinical Impression   This 73 yo male admitted and found to have above presents to acute OT with decreased vision in lower right quadrant both eyes. Will benefit from acute OT with follow up HHOT to assess safety with BADLs and IADLs in his own environment.    OT Assessment  Patient needs continued OT Services    Follow Up Recommendations  Home health OT             Frequency  Min 2X/week    Precautions / Restrictions Precautions Precautions: Fall Precaution Comments: Desats with minimal activity Restrictions Weight Bearing Restrictions: No   Pertinent Vitals/Pain 8/10 headache, RN made aware and brought pain meds in for him    ADL  Eating/Feeding: Independent Where Assessed - Eating/Feeding: Chair Grooming: Set up;Supervision/safety Where Assessed - Grooming: Unsupported standing Upper Body Bathing: Set up;Supervision/safety Where Assessed - Upper Body Bathing: Unsupported sit to stand Lower Body Bathing: Set up;Supervision/safety Where Assessed - Lower Body Bathing: Unsupported sit to stand Upper Body Dressing: Set up;Supervision/safety Where Assessed - Upper Body Dressing: Unsupported sit to stand Lower Body Dressing: Supervision/safety;Set up Where Assessed - Lower Body Dressing: Unsupported sit to stand Toilet Transfer: Supervision/safety Toilet Transfer Method: Sit to stand Toileting - Clothing Manipulation and Hygiene: Supervision/safety Equipment Used:  (O2 at 4 liters) Transfers/Ambulation Related to ADLs: S for all without AD    OT Diagnosis: Generalized weakness;Disturbance of vision  OT Problem List: Impaired vision/perception OT Treatment  Interventions: Visual/perceptual remediation/compensation;Patient/family education   OT Goals(Current goals can be found in the care plan section) Acute Rehab OT Goals Patient Stated Goal: Home OT Goal Formulation: With patient Time For Goal Achievement: 10/29/13 Potential to Achieve Goals: Good  Visit Information  Last OT Received On: 10/22/13 Assistance Needed: +1 History of Present Illness: Headache, neck pain, confusion. MRI:  left occipital pole infarct       Prior Functioning     Home Living Family/patient expects to be discharged to:: Private residence Living Arrangements: Alone Available Help at Discharge: Family;Available PRN/intermittently (live right in front of him (son and daughter-in-law)) Type of Home: House Home Access: Stairs to enter [employer]trance Stairs-Number of Steps: 4 Entrance Stairs-Rails: Right;Left;Can reach both Home Layout: One level Home Equipment: Walker - 2 wheels;Cane - single point;Grab bars - tub/shower;Shower seat;Hand held shower head Prior Function Level of Independence: Independent Communication Communication: HOH Dominant Hand: Right         Vision/Perception Vision - History Baseline Vision: Wears glasses for distance only Visual History: Macular degeneration Patient Visual Report:  ("noticing something in my right lower eye") Vision - Assessment Eye Alignment: Within Functional Limits Vision Assessment: Vision tested Ocular Range of Motion: Within Functional Limits Alignment/Gaze Preference: Within Defined Limits Visual Fields:  (right lower quandrant of both eyes impaired)   Cognition  Cognition Arousal/Alertness: Awake/alert Behavior During Therapy: WFL for tasks assessed/performed Overall Cognitive Status: Within Functional Limits for tasks assessed (7th grade level of education--poor reader)    Extremity/Trunk Assessment Upper Extremity Assessment Upper Extremity Assessment: Defer to OT evaluation Lower Extremity  Assessment Lower Extremity Assessment: Generalized weakness     Mobility Transfers Overall transfer level: Needs assistance Equipment used: None Transfers: Sit to/from Stand Sit to Stand: Supervision  End of Session OT - End of Session Equipment Utilized During Treatment: Oxygen Activity Tolerance: Patient limited by fatigue Patient left: in chair;with call bell/phone within reach;with family/visitor present       Evette Georges [phone] 10/22/2013, 2:16 PM

## 2013-10-22 NOTE — Progress Notes (Signed)
VASCULAR LAB PRELIMINARY  PRELIMINARY  PRELIMINARY  PRELIMINARY  Carotid duplex  completed.    Preliminary report:  Bilateral:  1-39% ICA stenosis.  Vertebral artery flow is antegrade.      Rodolfo Gaster, RVT 10/22/2013, 8:20 AM

## 2013-10-23 ENCOUNTER — Encounter (HOSPITAL_COMMUNITY): Payer: Self-pay | Admitting: *Deleted

## 2013-10-23 ENCOUNTER — Encounter (HOSPITAL_COMMUNITY)
Admission: EM | Disposition: A | Discharge status: Home / Self Care | Payer: Self-pay | Source: Home / Self Care | Attending: Internal Medicine

## 2013-10-23 DIAGNOSIS — I359 Nonrheumatic aortic valve disorder, unspecified: Secondary | ICD-10-CM

## 2013-10-23 DIAGNOSIS — Q211 Atrial septal defect: Secondary | ICD-10-CM

## 2013-10-23 DIAGNOSIS — I6350 Cerebral infarction due to unspecified occlusion or stenosis of unspecified cerebral artery: Secondary | ICD-10-CM

## 2013-10-23 DIAGNOSIS — Q231 Congenital insufficiency of aortic valve: Secondary | ICD-10-CM

## 2013-10-23 DIAGNOSIS — Z01810 Encounter for preprocedural cardiovascular examination: Secondary | ICD-10-CM

## 2013-10-23 DIAGNOSIS — I350 Nonrheumatic aortic (valve) stenosis: Secondary | ICD-10-CM

## 2013-10-23 DIAGNOSIS — Q230 Congenital stenosis of aortic valve: Secondary | ICD-10-CM

## 2013-10-23 DIAGNOSIS — Q2112 Patent foramen ovale: Secondary | ICD-10-CM

## 2013-10-23 DIAGNOSIS — I253 Aneurysm of heart: Secondary | ICD-10-CM

## 2013-10-23 HISTORY — PX: TEE WITHOUT CARDIOVERSION: SHX5443

## 2013-10-23 HISTORY — PX: LOOP RECORDER IMPLANT: SHX5477

## 2013-10-23 LAB — BASIC METABOLIC PANEL
BUN: 17 mg/dL (ref 6–23)
CHLORIDE: 102 meq/L (ref 96–112)
CO2: 27 mEq/L (ref 19–32)
Calcium: 8.7 mg/dL (ref 8.4–10.5)
Creatinine, Ser: 1.02 mg/dL (ref 0.50–1.35)
GFR calc non Af Amer: 70 mL/min — ABNORMAL LOW (ref >90)
GFR, EST AFRICAN AMERICAN: 81 mL/min — AB (ref >90)
Glucose, Bld: 170 mg/dL — ABNORMAL HIGH (ref 70–99)
POTASSIUM: 4.3 meq/L (ref 3.7–5.3)
Sodium: 143 mEq/L (ref 137–147)

## 2013-10-23 LAB — CBC
HEMATOCRIT: 39.7 % (ref 39.0–52.0)
HEMOGLOBIN: 13.6 g/dL (ref 13.0–17.0)
MCH: 30.6 pg (ref 26.0–34.0)
MCHC: 34.3 g/dL (ref 30.0–36.0)
MCV: 89.4 fL (ref 78.0–100.0)
Platelets: 138 10*3/uL — ABNORMAL LOW (ref 150–400)
RBC: 4.44 MIL/uL (ref 4.22–5.81)
RDW: 14.8 % (ref 11.5–15.5)
WBC: 8.4 10*3/uL (ref 4.0–10.5)

## 2013-10-23 LAB — GLUCOSE, CAPILLARY
GLUCOSE-CAPILLARY: 156 mg/dL — AB (ref 70–99)
GLUCOSE-CAPILLARY: 179 mg/dL — AB (ref 70–99)
GLUCOSE-CAPILLARY: 154 mg/dL — AB (ref 70–99)

## 2013-10-23 SURGERY — ECHOCARDIOGRAM, TRANSESOPHAGEAL
Anesthesia: Moderate Sedation

## 2013-10-23 SURGERY — LOOP RECORDER IMPLANT
Anesthesia: LOCAL

## 2013-10-23 MED ORDER — ATORVASTATIN CALCIUM 10 MG PO TABS
10.0000 mg | ORAL_TABLET | Freq: Every day | ORAL | Status: DC
  Filled 2013-10-23: qty 1

## 2013-10-23 MED ORDER — LIDOCAINE-EPINEPHRINE 1 %-1:100000 IJ SOLN
INTRAMUSCULAR | Status: AC
  Filled 2013-10-23: qty 1

## 2013-10-23 MED ORDER — METFORMIN HCL 500 MG PO TABS: 500.0000 mg | ORAL_TABLET | Freq: Two times a day (BID) | ORAL | Status: AC

## 2013-10-23 MED ORDER — CLOPIDOGREL BISULFATE 75 MG PO TABS: 75.0000 mg | ORAL_TABLET | Freq: Every day | ORAL | Status: DC

## 2013-10-23 MED ORDER — AMLODIPINE BESYLATE 5 MG PO TABS
5.0000 mg | ORAL_TABLET | Freq: Every day | ORAL | Status: DC
  Filled 2013-10-23: qty 1

## 2013-10-23 MED ORDER — BUTAMBEN-TETRACAINE-BENZOCAINE 2-2-14 % EX AERO
INHALATION_SPRAY | CUTANEOUS | Status: DC | PRN
  Administered 2013-10-23: 1 via TOPICAL

## 2013-10-23 MED ORDER — MIDAZOLAM HCL 10 MG/2ML IJ SOLN
INTRAMUSCULAR | Status: DC | PRN
  Administered 2013-10-23 (×3): 1 mg via INTRAVENOUS

## 2013-10-23 MED ORDER — ATORVASTATIN CALCIUM 20 MG PO TABS: 10.0000 mg | ORAL_TABLET | Freq: Every day | ORAL | Status: DC

## 2013-10-23 MED ORDER — MIDAZOLAM HCL 5 MG/ML IJ SOLN
INTRAMUSCULAR | Status: AC
  Filled 2013-10-23: qty 2

## 2013-10-23 MED ORDER — SODIUM CHLORIDE 0.9 % IV SOLN
INTRAVENOUS | Status: DC
  Administered 2013-10-23: 500 mL via INTRAVENOUS

## 2013-10-23 MED ORDER — LIDOCAINE VISCOUS 2 % MT SOLN
OROMUCOSAL | Status: AC
  Filled 2013-10-23: qty 15

## 2013-10-23 MED ORDER — FENTANYL CITRATE 0.05 MG/ML IJ SOLN
INTRAMUSCULAR | Status: AC
  Filled 2013-10-23: qty 2

## 2013-10-23 MED ORDER — FENTANYL CITRATE 0.05 MG/ML IJ SOLN
INTRAMUSCULAR | Status: DC | PRN
  Administered 2013-10-23: 25 ug via INTRAVENOUS

## 2013-10-23 MED ORDER — LIDOCAINE VISCOUS 2 % MT SOLN
OROMUCOSAL | Status: DC | PRN
  Administered 2013-10-23: 20 mL via OROMUCOSAL

## 2013-10-23 MED ORDER — SIMVASTATIN 20 MG PO TABS
20.0000 mg | ORAL_TABLET | Freq: Every day | ORAL | Status: DC
  Filled 2013-10-23: qty 1

## 2013-10-23 MED ORDER — AMLODIPINE BESYLATE 5 MG PO TABS: 5.0000 mg | ORAL_TABLET | Freq: Every day | ORAL | Status: DC

## 2013-10-23 NOTE — Progress Notes (Signed)
Bilateral lower extremity venous duplex:  No evidence of DVT, superficial thrombosis, or Baker's Cyst.   

## 2013-10-23 NOTE — Progress Notes (Signed)
Occupational Therapy Treatment Patient Details Name: Jeanmarie HubertJoe Frerking MRN: [id number] DOB: 1941/08/12 Today's Date: 10/23/2013 Time: 4098-11911325-1346 OT Time Calculation (min): 21 min  OT Assessment / Plan / Recommendation  History of present illness Headache, neck pain, confusion. MRI:  left occipital pole infarct   OT comments  Further assessed vision. Pt with R lower homonymous quadrantnopsia  . Educated pt/family on home needs and home safety. Discussed increased risk for falls due to lower field cut. Educated on scanning activities and increasing head turns to locate objects, REc pt follow up with HHOT.   Follow Up Recommendations  Home health OT    Barriers to Discharge       Equipment Recommendations       Recommendations for Other Services  HHOT  Frequency Min 2X/week   Progress towards OT Goals  progressing  Plan   HHOT   Precautions / Restrictions Precautions Precautions: ICD/Pacemaker Precaution Comments: Desats with minimal activity Restrictions Weight Bearing Restrictions: No   Pertinent Vitals/Pain No apparent deficits.    ADL  ADL Comments: Pt with Apparnt Linferoir     OT Diagnosis: Disturbance of vision  OT Problem List: Impaired vision/perception OT Treatment Interventions: Visual/perceptual remediation/compensation;Patient/family education   OT Goals(current goals can now be found in the care plan section) Acute Rehab OT Goals Patient Stated Goal: Home OT Goal Formulation: With patient Time For Goal Achievement: 10/29/13 Potential to Achieve Goals: Good  Visit Information  Last OT Received On: 10/23/13 Assistance Needed: +1 History of Present Illness: Headache, neck pain, confusion. MRI:  left occipital pole infarct    Subjective Data      Prior Functioning  Home Living Family/patient expects to be discharged to:: Private residence Living Arrangements: Alone Available Help at Discharge: Family;Available PRN/intermittently Type of Home: House Home  Access: Stairs to enter [employer]trance Stairs-Number of Steps: 4 Entrance Stairs-Rails: Right;Left;Can reach both Home Layout: One level Home Equipment: Walker - 2 wheels;Cane - single point;Grab bars - tub/shower;Shower seat;Hand held shower head Prior Function Level of Independence: Independent Dominant Hand: Right    Cognition  Cognition Arousal/Alertness: Awake/alert Behavior During Therapy: WFL for tasks assessed/performed Overall Cognitive Status: Within Functional Limits for tasks assessed    Mobility  Bed Mobility Overal bed mobility: Modified Independent Transfers Overall transfer level: Needs assistance Equipment used: Rolling walker (2 wheeled) Transfers: Sit to/from Stand Sit to Stand: Modified independent (Device/Increase time)    Exercises      Balance Balance Overall balance assessment: Needs assistance Standing balance-Leahy Scale: Zero  End of Session OT - End of Session Equipment Utilized During Treatment: Gait belt Activity Tolerance: Patient tolerated treatment well Patient left: in bed;with call bell/phone within reach;with bed alarm set Nurse Communication: Mobility status  GO     Latese Dufault,HILLARY 10/23/2013, 1:59 PM [facility] Luciano Cinquemani, OTR/L  [phone] 10/23/2013

## 2013-10-23 NOTE — Op Note (Signed)
SURGEON:  Hillis RangeJames Amaury Kuzel, MD     PREPROCEDURE DIAGNOSIS:  Cryptogenic Stroke    POSTPROCEDURE DIAGNOSIS:  Cryptogenic Stroke     PROCEDURES:   1. Implantable loop recorder implantation    INTRODUCTION:  Brett Michael is a 73 y.o. male with a history of unexplained stroke who presents today for implantable loop implantation.  The patient has had a cryptogenic stroke.  Despite an extensive workup by neurology, no reversible causes have been identified.  he has worn telemetry during which he did not have arrhythmias.  There is significant concern for possible atrial fibrillation as the cause for the patients stroke.  The patient therefore presents today for implantable loop implantation.     DESCRIPTION OF PROCEDURE:  Informed written consent was obtained, and the patient was brought to the electrophysiology lab in a fasting state.  The patient required no sedation for the procedure today.  Mapping over the patient's chest was performed by the EP lab staff to identify the area where electrograms were most prominent for ILR recording.  This area was found to be the left parasternal region over the 3rd-4th intercostal space. The patients left chest was therefore prepped and draped in the usual sterile fashion by the EP lab staff. The skin overlying the left parasternal region was infiltrated with lidocaine for local analgesia.  A 0.5-cm incision was made over the left parasternal region over the 3rd intercostal space.  A subcutaneous ILR pocket was fashioned using a combination of sharp and blunt dissection.  A Medtronic Reveal BarabooLinq model [id number] SN [id number] S  implantable loop recorder was then placed into the pocket  R waves were very prominent and measured 0.1243mV.  Steri- Strips and a sterile dressing were then applied.  There were no early apparent complications.     CONCLUSIONS:   1. Successful implantation of a Medtronic Reveal LINQ implantable loop recorder for cryptogenic stroke  2. No early apparent  complications.

## 2013-10-23 NOTE — Consult Note (Addendum)
ELECTROPHYSIOLOGY CONSULT NOTE  Patient ID: Brett Michael MRN: [id number], DOB/AGE: Aug 14, 1941   Admit date: 10/21/2013 Date of Consult: 10/23/2013  Primary Physician: Orland PenmanJARALLA SHAMLEFFER, IBTEHAL, MD [facility] Reason for Consultation: Cryptogenic stroke; recommendations regarding Implantable Loop Recorder  History of Present Illness Brett HubertJoe Beaudin was admitted on 10/21/2013 with changes in vision and confusion.  Imaging demonstrated left occipital lobe infarct.  He has undergone workup for stroke including echocardiogram and carotid dopplers.  The patient has been monitored on telemetry which has demonstrated sinus rhythm with no arrhythmias.  Inpatient stroke work-up is to be completed with a TEE.   Echocardiogram this admission demonstrated an EF of 50-55%, no RWMA, moderate AS, LA 36.  Lab work has been  Unremarkable except for HgbA1C of 6.7.  Brett Michael recently moved to [place] from [place].  He has never seen a cardiologist before.  Past medical history is significant for COPD, hypertension, and diabetes.  He is scheduled to establish with Dr Delton CoombesByrum tomorrow for pulmonary follow-up.   Prior to admission, the patient denies chest pain, shortness of breath, dizziness, palpitations, or syncope.  They are recovering from their stroke with plans to return home at discharge.  EP has been asked to evaluate for placement of an implantable loop recorder to monitor for atrial fibrillation.  ROS is negative except as outlined above.    Past Medical History  Diagnosis Date  . Diabetes mellitus without complication   . COPD (chronic obstructive pulmonary disease)   . Hypertension   . Renal disorder     kidney stones     Surgical History: History reviewed. No pertinent past surgical history.   Prescriptions prior to admission  Medication Sig Dispense Refill  . amLODipine (NORVASC) 10 MG tablet Take 10 mg by mouth daily.      Marland Kitchen. aspirin EC 81 MG  tablet Take 81 mg by mouth daily.      . beta carotene w/minerals (OCUVITE) tablet Take 1 tablet by mouth 2 (two) times daily.      . Fluticasone-Salmeterol (ADVAIR) 250-50 MCG/DOSE AEPB Inhale 1 puff into the lungs 2 (two) times daily.      . insulin glargine (LANTUS) 100 UNIT/ML injection Inject 12 Units into the skin at bedtime.      . Ipratropium-Albuterol (COMBIVENT RESPIMAT) 20-100 MCG/ACT AERS respimat Inhale 1 puff into the lungs every 6 (six) hours as needed for wheezing.      . metFORMIN (GLUCOPHAGE) 500 MG tablet Take 500 mg by mouth 2 (two) times daily with a meal.      . Multiple Vitamins-Minerals (MULTIVITAMIN PO) Take 1 tablet by mouth daily.      . quinapril (ACCUPRIL) 40 MG tablet Take 40 mg by mouth every morning.      . sertraline (ZOLOFT) 100 MG tablet Take 100 mg by mouth daily.      . theophylline (THEO-24) 400 MG 24 hr capsule Take 400 mg by mouth daily.      Marland Kitchen. tiotropium (SPIRIVA) 18 MCG inhalation capsule Place 18 mcg into inhaler and inhale daily.        Inpatient Medications:  . aspirin EC  81 mg Oral Daily  . enoxaparin (LOVENOX) injection  40 mg Subcutaneous Q24H  . insulin aspart  0-9 Units Subcutaneous TID WC  . insulin glargine  12 Units Subcutaneous BH-[id number]  . mometasone-formoterol  2 puff Inhalation BID  . nicotine  14 mg Transdermal Daily  . sertraline  100 mg Oral  Daily  . theophylline  400 mg Oral Daily  . tiotropium  18 mcg Inhalation Daily    Allergies: No Known Allergies  History   Social History  . Marital Status: Widowed    Spouse Name: N/A    Number of Children: N/A  . Years of Education: N/A   Occupational History  . Not on file.   Social History Main Topics  . Smoking status: Former Games developer  . Smokeless tobacco: Not on file  . Alcohol Use: No  . Drug Use: Not on file  . Sexual Activity: Not on file   Other Topics Concern  . Not on file   Social History Narrative  . No narrative on file    FH- HTN Physical Exam: Filed  Vitals:   10/22/13 1736 10/22/13 2020 10/23/13 0116 10/23/13 0600  BP: 150/79 151/68 180/76 167/85  Pulse: 73 87 64 79  Temp: 99.3 F (37.4 C) 98 F (36.7 C) 98 F (36.7 C) 98.1 F (36.7 C)  TempSrc: Oral Oral Oral Oral  Resp: 20 18 20 18   Height:      Weight:      SpO2: 94% 90% 93% 92%    GEN- The patient is well appearing, sleeping but rouses and oriented x 3 today.   Head- normocephalic, atraumatic Eyes-  Sclera clear, conjunctiva pink Ears- hearing intact Oropharynx- clear Neck- supple, no JVP Lymph- no cervical lymphadenopathy Lungs- Clear to ausculation bilaterally, normal work of breathing Heart- Regular rate and rhythm, 2/6 SEM LUSB (early peaking) GI- soft, NT, ND, + BS Extremities- no clubbing, cyanosis, or edema MS- no significant deformity or atrophy Skin- no rash or lesion Psych- euthymic mood, full affect  Labs:   Lab Results  Component Value Date   WBC 8.4 10/23/2013   HGB 13.6 10/23/2013   HCT 39.7 10/23/2013   MCV 89.4 10/23/2013   PLT 138* 10/23/2013    Recent Labs Lab 10/21/13 1330  10/23/13 0421  NA 138  < > 143  K 4.0  < > 4.3  CL 97  < > 102  CO2 26  < > 27  BUN 12  < > 17  CREATININE 1.00  < > 1.02  CALCIUM 9.3  < > 8.7  PROT 7.2  --   --   BILITOT 0.9  --   --   ALKPHOS 79  --   --   ALT 16  --   --   AST 14  --   --   GLUCOSE 197*  < > 170*  < > = values in this interval not displayed. Lab Results  Component Value Date   TROPONINI <0.30 10/21/2013   Lab Results  Component Value Date   CHOL 180 10/22/2013   Lab Results  Component Value Date   HDL 35* 10/22/2013   Lab Results  Component Value Date   LDLCALC 110* 10/22/2013   Lab Results  Component Value Date   TRIG 174* 10/22/2013   Lab Results  Component Value Date   CHOLHDL 5.1 10/22/2013     Radiology/Studies: Dg Chest 2 View 10/21/2013   CLINICAL DATA:  Altered mental status  EXAM: CHEST  2 VIEW  COMPARISON:  None.  FINDINGS: There is underlying emphysematous change. There  are calcified granulomas on the left. There is no edema or consolidation. Heart is mildly enlarged. The pulmonary vascularity reflects underlying emphysema. No adenopathy. There is atherosclerotic change in the aorta. There is syndesmotic change in the thoracic spine.  IMPRESSION: Underlying emphysematous change. No edema or consolidation. Mild cardiomegaly. Evidence of calcified granulomas on the left.  There is evidence of syndesmotic change in the thoracic spine. 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   Electronically Signed   By: Bretta Bang M.D.   On: 10/21/2013 14:22   Ct Head Wo Contrast 10/21/2013   CLINICAL DATA:  Altered mental status. Slurred speech. Diabetic with hypertension.  EXAM: CT HEAD WITHOUT CONTRAST  TECHNIQUE: Contiguous axial images were obtained from the base of the skull through the vertex without intravenous contrast.  COMPARISON:  None.  FINDINGS: Sinuses/Soft tissues: Right maxillary sinus mucous retention cyst or polyp. Mild motion degradation. Ethmoid air cell mucosal thickening. Clear mastoid air cells.  Intracranial: Advanced cerebral atrophy. Moderate low density in the periventricular white matter likely related to small vessel disease. Cerebellar atrophy which is also advanced. Cortically based hypoattenuation in the medial left occipital lobe, including on image 15/series 2. No hemorrhage, hydrocephalus, mass lesion, intra-axial, or extra-axial fluid collection.  IMPRESSION: 1. Advanced cerebral atrophy and small vessel ischemic change. 2. Mildly motion degraded exam. 3. Medial left occipital lobe cortically based hypoattenuation is suspicious for subacute infarct. These results were called by telephone at the time of interpretation on 10/21/2013 at 2:18 PM to Dr. Doug Sou , who verbally acknowledged these results.   Electronically Signed   By: Jeronimo Greaves M.D.   On: 10/21/2013 14:20   Mr Shirlee Latch Wo Contrast  10/22/2013   CLINICAL DATA:  73 year old male with headache and neck pain. Altered mental status at presentation. Dysphasia. Initial encounter.  EXAM: MRI HEAD WITHOUT CONTRAST  MRA HEAD WITHOUT CONTRAST  TECHNIQUE: Multiplanar, multiecho pulse sequences of the brain and surrounding structures were obtained without intravenous contrast. Angiographic images of the head were obtained using MRA technique without contrast.  COMPARISON:  Head CT without contrast 10/21/2013.  FINDINGS: MRI HEAD FINDINGS  3 cm confluent area of restricted diffusion in the left occipital pole (series 5 image 16). Confluent associated T2 and FLAIR hyperintensity with gyral enlargement most compatible with cytotoxic edema. No significant mass effect. No acute intracranial hemorrhage identified.  No other restricted diffusion identified. Major intracranial vascular flow voids are preserved with dominant, dolichoectatic distal left vertebral artery.  Patchy and confluent cerebral white matter T2 and FLAIR hyperintensity superimposed. No chronic cortical encephalomalacia. Deep gray matter nuclei within normal limits. Mild patchy T2 hyperintensity in the pons. Cerebellum within normal limits. Negative pituitary, cervicomedullary junction and visualized cervical spine. No ventriculomegaly. No midline shift, mass effect, or evidence of intracranial mass lesion. Visible internal auditory structures appear normal.  Postoperative changes to the globes. Minimal paranasal sinus mucosal thickening. Trace right mastoid fluid. Normal bone marrow signal. Visualized scalp soft tissues are within normal limits.  MRA HEAD FINDINGS  Antegrade flow in the posterior circulation with dominant, dolichoectatic distal left vertebral artery. Nondominant right vertebral artery terminates in PICA. Normal left PICA origin. Tortuous basilar artery without stenosis. SCA and PCA origins are patent. Posterior communicating arteries are diminutive or absent.  Hyperdynamic left  PCA flow. Otherwise negative bilateral PCA branches.  Antegrade flow in both ICA siphons. Suspect bilateral cavernous ICA atherosclerotic pseudo lesions. Normal ophthalmic artery origins. No ICA siphon stenosis. Patent carotid termini. Normal MCA and ACA origins. Normal anterior communicating artery with median artery of the corpus callosum variant. Negative bilateral ACA branches. Negative bilateral MCA branches except for hyperdynamic posterior left MCA branch flow.  IMPRESSION: 1. Signal abnormality involving  the left occipital pole most compatible with acute infarct. Cytotoxic edema but no significant mass effect. No intracranial hemorrhage. 2. Intracranial MRA suggesting hyperdynamic left PCA and posterior left MCA flow, such as due to luxury perfusion phenomena. Dolichoectatic dominant distal left vertebral artery. 3. Superimposed cerebral white matter signal changes, most commonly due to chronic small vessel disease.   Electronically Signed   By: Augusto Gamble M.D.   On: 10/22/2013 10:26   12-lead ECG sinus rhythm, rate 81, normal intervals  Telemetry sinus rhythm with no atrial arrhythmias  Assessment and Plan:  1. Cryptogenic stroke The patient presents with cryptogenic stroke.  The patient has a TEE planned for this AM.  I spoke at length with the patient about monitoring for afib with either a 30 day event monitor or an implantable loop recorder.  Risks, benefits, and alteratives to implantable loop recorder were discussed with the patient today.   At this time, the patient is very clear in their decision to proceed with implantable loop recorder.   2. HTN Slightly elevated Will defer acute management to neurology  3. Aortic stenosis TEE to further assess today We will establish cardiology follow up for the patient for management of AS.   Please call with questions.

## 2013-10-23 NOTE — H&P (Signed)
     INTERVAL PROCEDURE H&P  History and Physical Interval Note:  10/23/2013 7:47 AM  Brett Michael has presented today for their planned procedure. The various methods of treatment have been discussed with the patient and family. After consideration of risks, benefits and other options for treatment, the patient has consented to the procedure.  The patients' outpatient history has been reviewed, patient examined, and no change in status from most recent office note within the past 30 days. I have reviewed the patients' chart and labs and will proceed as planned. Questions were answered to the patient's satisfaction.   Brett NoseKenneth C. Leiana Rund, MD, [facility]ACC Attending Cardiologist [facility]  [name] C 10/23/2013, 7:47 AM

## 2013-10-23 NOTE — Progress Notes (Signed)
Pt. DC'd home via car with family.  DC instructions given to patient and understood.  Vital signs and assessments were stable.

## 2013-10-23 NOTE — Progress Notes (Signed)
[facility] PROGRESS NOTE  Brett Michael ZOX:[id number] DOB: Feb 05, 1941 DOA: 10/21/2013 PCP: Orland Penman, MD  Assessment/Plan: Brief history  73 year old male with a history of hypertension, diabetes mellitus, COPD presents with one-day history of headache and confusion. The patient was in his usual state of health until the morning of admission when he woke up with a headache, left sided neck pain and some word finding difficulties. There was no reports of fevers, chills, chest discomfort, worsening shortness of breath, vomiting, or diarrhea. The patient did have one episode of emesis after he was given morphine. The patient is chronically on 3 L nasal cannula at home for his COPD. CT of the brain showed a medial left occipital lobe hypoattenuation suspicious for a subcortical infarct. Chest x-ray showed emphysematous changes. Workup was undertaken after neurology was consulted.  Assessment/Plan:  Acute left occipital lobe infarct  -Dysphasia/Delirium clinically resolved  -Given abnormal CT brain, concerned about stroke  -MRI brain--acute left occipital lobe infarct  -MRA brain--hyperdynamic left PCA flow, hyperdynamic posterior left MCA flow  -Echocardiogram (TTE)--EF 50-55%, grade 1 diastolic dysfunction, moderate aortic stenosis  -Carotid Dopplers--no hemodynamically significant stenosis  -Lipid panel-LDL 110  -hemoglobin A1c--6.7  -continue ASA 325mg  daily -Urinalysis negative for pyuria  -TEE planned 10/23/13 -Loop recorder planned 10/23/13 -home with home health PT/OT  Hypertension  -initially allowed permission HTN  -restart amlodipine 5mg  daily  -restart home accupril dose  Dyslipidemia  -Start atorvastatin due to interaction of simvastatin with amlodipine  COPD  -Clinically stable on 3 L nasal cannula  -Continue long acting beta agonist, Spiriva, theophylline  Tobacco abuse  -Patient chews tobacco  -Cessation discussed  -Nicoderm patch  Diabetes  mellitus type 2  -Hemoglobin A1c--6.7  -Continue Lantus 12 units with NovoLog sliding scale  Leukocytosis  -No fever, hemodynamically stable  -Continue to monitor  -Improving off of antibiotics-  Family Communication: Son and daughter at beside  Disposition Plan: Home when medically stable          Procedures/Studies: Dg Chest 2 View  10/21/2013   CLINICAL DATA:  Altered mental status  EXAM: CHEST  2 VIEW  COMPARISON:  None.  FINDINGS: There is underlying emphysematous change. There are calcified granulomas on the left. There is no edema or consolidation. Heart is mildly enlarged. The pulmonary vascularity reflects underlying emphysema. No adenopathy. There is atherosclerotic change in the aorta. There is syndesmotic change in the thoracic spine.  IMPRESSION: Underlying emphysematous change. No edema or consolidation. Mild cardiomegaly. Evidence of calcified granulomas on the left.  There is evidence of syndesmotic change in the thoracic spine. 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   Electronically Signed   By: Bretta Bang M.D.   On: 10/21/2013 14:22   Ct Head Wo Contrast  10/21/2013   CLINICAL DATA:  Altered mental status. Slurred speech. Diabetic with hypertension.  EXAM: CT HEAD WITHOUT CONTRAST  TECHNIQUE: Contiguous axial images were obtained from the base of the skull through the vertex without intravenous contrast.  COMPARISON:  None.  FINDINGS: Sinuses/Soft tissues: Right maxillary sinus mucous retention cyst or polyp. Mild motion degradation. Ethmoid air cell mucosal thickening. Clear mastoid air cells.  Intracranial: Advanced cerebral atrophy. Moderate low density in the periventricular white matter likely related to small vessel disease. Cerebellar atrophy which is also advanced. Cortically based hypoattenuation in the medial left occipital lobe, including on image 15/series 2. No hemorrhage, hydrocephalus, mass lesion,  intra-axial, or extra-axial fluid collection.  IMPRESSION: 1. Advanced cerebral  atrophy and small vessel ischemic change. 2. Mildly motion degraded exam. 3. Medial left occipital lobe cortically based hypoattenuation is suspicious for subacute infarct. These results were called by telephone at the time of interpretation on 10/21/2013 at 2:18 PM to Dr. Doug Sou , who verbally acknowledged these results.   Electronically Signed   By: Jeronimo Greaves M.D.   On: 10/21/2013 14:20   Mr Shirlee Latch Wo Contrast  10/22/2013   CLINICAL DATA:  73 year old male with headache and neck pain. Altered mental status at presentation. Dysphasia. Initial encounter.  EXAM: MRI HEAD WITHOUT CONTRAST  MRA HEAD WITHOUT CONTRAST  TECHNIQUE: Multiplanar, multiecho pulse sequences of the brain and surrounding structures were obtained without intravenous contrast. Angiographic images of the head were obtained using MRA technique without contrast.  COMPARISON:  Head CT without contrast 10/21/2013.  FINDINGS: MRI HEAD FINDINGS  3 cm confluent area of restricted diffusion in the left occipital pole (series 5 image 16). Confluent associated T2 and FLAIR hyperintensity with gyral enlargement most compatible with cytotoxic edema. No significant mass effect. No acute intracranial hemorrhage identified.  No other restricted diffusion identified. Major intracranial vascular flow voids are preserved with dominant, dolichoectatic distal left vertebral artery.  Patchy and confluent cerebral white matter T2 and FLAIR hyperintensity superimposed. No chronic cortical encephalomalacia. Deep gray matter nuclei within normal limits. Mild patchy T2 hyperintensity in the pons. Cerebellum within normal limits. Negative pituitary, cervicomedullary junction and visualized cervical spine. No ventriculomegaly. No midline shift, mass effect, or evidence of intracranial mass lesion. Visible internal auditory structures appear normal.  Postoperative changes to the  globes. Minimal paranasal sinus mucosal thickening. Trace right mastoid fluid. Normal bone marrow signal. Visualized scalp soft tissues are within normal limits.  MRA HEAD FINDINGS  Antegrade flow in the posterior circulation with dominant, dolichoectatic distal left vertebral artery. Nondominant right vertebral artery terminates in PICA. Normal left PICA origin. Tortuous basilar artery without stenosis. SCA and PCA origins are patent. Posterior communicating arteries are diminutive or absent.  Hyperdynamic left PCA flow. Otherwise negative bilateral PCA branches.  Antegrade flow in both ICA siphons. Suspect bilateral cavernous ICA atherosclerotic pseudo lesions. Normal ophthalmic artery origins. No ICA siphon stenosis. Patent carotid termini. Normal MCA and ACA origins. Normal anterior communicating artery with median artery of the corpus callosum variant. Negative bilateral ACA branches. Negative bilateral MCA branches except for hyperdynamic posterior left MCA branch flow.  IMPRESSION: 1. Signal abnormality involving the left occipital pole most compatible with acute infarct. Cytotoxic edema but no significant mass effect. No intracranial hemorrhage. 2. Intracranial MRA suggesting hyperdynamic left PCA and posterior left MCA flow, such as due to luxury perfusion phenomena. Dolichoectatic dominant distal left vertebral artery. 3. Superimposed cerebral white matter signal changes, most commonly due to chronic small vessel disease.   Electronically Signed   By: Augusto Gamble M.D.   On: 10/22/2013 10:26   Mr Brain Wo Contrast  10/22/2013   CLINICAL DATA:  73 year old male with headache and neck pain. Altered mental status at presentation. Dysphasia. Initial encounter.  EXAM: MRI HEAD WITHOUT CONTRAST  MRA HEAD WITHOUT CONTRAST  TECHNIQUE: Multiplanar, multiecho pulse sequences of the brain and surrounding structures were obtained without intravenous contrast. Angiographic images of the head were obtained using MRA  technique without contrast.  COMPARISON:  Head CT without contrast 10/21/2013.  FINDINGS: MRI HEAD FINDINGS  3 cm confluent area of restricted diffusion in the left occipital pole (series 5 image 16). Confluent associated T2 and FLAIR hyperintensity with gyral  enlargement most compatible with cytotoxic edema. No significant mass effect. No acute intracranial hemorrhage identified.  No other restricted diffusion identified. Major intracranial vascular flow voids are preserved with dominant, dolichoectatic distal left vertebral artery.  Patchy and confluent cerebral white matter T2 and FLAIR hyperintensity superimposed. No chronic cortical encephalomalacia. Deep gray matter nuclei within normal limits. Mild patchy T2 hyperintensity in the pons. Cerebellum within normal limits. Negative pituitary, cervicomedullary junction and visualized cervical spine. No ventriculomegaly. No midline shift, mass effect, or evidence of intracranial mass lesion. Visible internal auditory structures appear normal.  Postoperative changes to the globes. Minimal paranasal sinus mucosal thickening. Trace right mastoid fluid. Normal bone marrow signal. Visualized scalp soft tissues are within normal limits.  MRA HEAD FINDINGS  Antegrade flow in the posterior circulation with dominant, dolichoectatic distal left vertebral artery. Nondominant right vertebral artery terminates in PICA. Normal left PICA origin. Tortuous basilar artery without stenosis. SCA and PCA origins are patent. Posterior communicating arteries are diminutive or absent.  Hyperdynamic left PCA flow. Otherwise negative bilateral PCA branches.  Antegrade flow in both ICA siphons. Suspect bilateral cavernous ICA atherosclerotic pseudo lesions. Normal ophthalmic artery origins. No ICA siphon stenosis. Patent carotid termini. Normal MCA and ACA origins. Normal anterior communicating artery with median artery of the corpus callosum variant. Negative bilateral ACA branches. Negative  bilateral MCA branches except for hyperdynamic posterior left MCA branch flow.  IMPRESSION: 1. Signal abnormality involving the left occipital pole most compatible with acute infarct. Cytotoxic edema but no significant mass effect. No intracranial hemorrhage. 2. Intracranial MRA suggesting hyperdynamic left PCA and posterior left MCA flow, such as due to luxury perfusion phenomena. Dolichoectatic dominant distal left vertebral artery. 3. Superimposed cerebral white matter signal changes, most commonly due to chronic small vessel disease.   Electronically Signed   By: Augusto GambleLee  Hall M.D.   On: 10/22/2013 10:26         Subjective: Patient denies fevers, chills, chest discomfort, shortness breath, nausea, vomiting, diarrhea, abdominal pain, dysuria, hematuria.  Objective: Filed Vitals:   10/23/13 0940 10/23/13 1005 10/23/13 1348 10/23/13 1853  BP: 156/80 155/73 162/73 166/79  Pulse: 64 77 66 81  Temp:  98.5 F (36.9 C) 98 F (36.7 C) 98.8 F (37.1 C)  TempSrc:  Oral Oral Oral  Resp: 19 18 18 20   Height:      Weight:      SpO2: 94% 93% 94% 93%    Intake/Output Summary (Last 24 hours) at 10/23/13 1958 Last data filed at 10/23/13 1700  Gross per 24 hour  Intake   1160 ml  Output      0 ml  Net   1160 ml   Weight change:  Exam:   General:  Pt is alert, follows commands appropriately, not in acute distress  HEENT: No icterus, No thrush, No neck mass, [place]/AT  Cardiovascular: RRR, S1/S2, no rubs, no gallops  Respiratory: Bilateral scattered rales. No wheezing. Good air movement.  Abdomen: Soft/+BS, non tender, non distended, no guarding  Extremities: No edema, No lymphangitis, No petechiae, No rashes, no synovitis  Data Reviewed: Basic Metabolic Panel:  Recent Labs Lab 10/21/13 1330 10/21/13 1840 10/22/13 1100 10/23/13 0421  NA 138  --  139 143  K 4.0  --  4.1 4.3  CL 97  --  100 102  CO2 26  --  25 27  GLUCOSE 197*  --  164* 170*  BUN 12  --  15 17  CREATININE 1.00  0.97 1.03 1.02  CALCIUM 9.3  --  9.4 8.7   Liver Function Tests:  Recent Labs Lab 10/21/13 1330  AST 14  ALT 16  ALKPHOS 79  BILITOT 0.9  PROT 7.2  ALBUMIN 3.9   No results found for this basename: LIPASE, AMYLASE,  in the last 168 hours No results found for this basename: AMMONIA,  in the last 168 hours CBC:  Recent Labs Lab 10/21/13 1330 10/22/13 1100 10/23/13 0421  WBC 12.4* 10.4 8.4  NEUTROABS 10.7*  --   --   HGB 14.3 14.7 13.6  HCT 41.9 42.8 39.7  MCV 89.7 89.5 89.4  PLT 157 135* 138*   Cardiac Enzymes:  Recent Labs Lab 10/21/13 1330  TROPONINI <0.30   BNP: No components found with this basename: POCBNP,  CBG:  Recent Labs Lab 10/22/13 1619 10/22/13 2015 10/23/13 0647 10/23/13 1049 10/23/13 1732  GLUCAP 161* 219* 156* 179* 154*    No results found for this or any previous visit (from the past 240 hour(s)).   Scheduled Meds: . amLODipine  5 mg Oral Daily  . atorvastatin  10 mg Oral q1800  . clopidogrel  75 mg Oral Q breakfast  . enoxaparin (LOVENOX) injection  40 mg Subcutaneous Q24H  . insulin aspart  0-9 Units Subcutaneous TID WC  . insulin glargine  12 Units Subcutaneous BH-q7a  . mometasone-formoterol  2 puff Inhalation BID  . nicotine  14 mg Transdermal Daily  . sertraline  100 mg Oral Daily  . theophylline  400 mg Oral Daily  . tiotropium  18 mcg Inhalation Daily   Continuous Infusions:    Sullivan Blasing, DO  [facility] Pager [phone]  If 7PM-7AM, please contact night-coverage [email or web address] Password [email or web address] 10/23/2013, 7:58 PM   LOS: 2 days

## 2013-10-23 NOTE — Progress Notes (Signed)
Physical Therapy Note  Pt at procedure for TEE and Loop Recorder placement per RN.  Will f/u another time.    [address] Madison StreetMegan Jhostin Epps, South CarolinaPT [phone]

## 2013-10-23 NOTE — Progress Notes (Signed)
Physical Therapy Treatment Patient Details Name: Brett Michael MRN: [id number] DOB: 29-Nov-1940 Today's Date: 10/23/2013 Time: [id number] PT Time Calculation (min): 12 min  PT Assessment / Plan / Recommendation  History of Present Illness Headache, neck pain, confusion. MRI:  left occipital pole infarct   PT Comments   Pt indicates fatigue today, but agreeable to mobility.  Pt again desats with minimal activity down to 79% on 4L O2 after ambulating only 90'.  Will continue to follow.    Follow Up Recommendations  Home health PT;Supervision - Intermittent     Does the patient have the potential to tolerate intense rehabilitation     Barriers to Discharge        Equipment Recommendations  None recommended by PT    Recommendations for Other Services    Frequency Min 4X/week   Progress towards PT Goals Progress towards PT goals: Progressing toward goals  Plan Current plan remains appropriate    Precautions / Restrictions Precautions Precautions: Fall;ICD/Pacemaker Precaution Comments: Desats with minimal activity Restrictions Weight Bearing Restrictions: No   Pertinent Vitals/Pain Denied pain.      Mobility  Bed Mobility Overal bed mobility: Modified Independent Transfers Overall transfer level: Needs assistance Equipment used: None Transfers: Sit to/from Stand Sit to Stand: Supervision General transfer comment: pt tends to hurry to sit when feeling SOB.  Needs cues to get closer to bed/chair prior to sitting.   Ambulation/Gait Ambulation/Gait assistance: Supervision Ambulation Distance (Feet): 90 Feet (x2) Assistive device: None Gait Pattern/deviations: Step-through pattern;Decreased stride length General Gait Details: pt again moving well, but desats on 4L O2 to 79% with first ambulation and 83% with 2nd ambulation.  pt needs ~3-664mins of seated rest break to bring sats back into 90's.   Modified Rankin (Stroke Patients Only) Pre-Morbid Rankin Score: Slight  disability Modified Rankin: Moderate disability    Exercises     PT Diagnosis:    PT Problem List:   PT Treatment Interventions:     PT Goals (current goals can now be found in the care plan section) Acute Rehab PT Goals Patient Stated Goal: Home Time For Goal Achievement: 11/05/13 Potential to Achieve Goals: Good  Visit Information  Last PT Received On: 10/23/13 Assistance Needed: +1 History of Present Illness: Headache, neck pain, confusion. MRI:  left occipital pole infarct    Subjective Data  Patient Stated Goal: Home   Cognition  Cognition Arousal/Alertness: Awake/alert Behavior During Therapy: WFL for tasks assessed/performed Overall Cognitive Status: Within Functional Limits for tasks assessed    Balance  Balance Overall balance assessment: Needs assistance Standing balance support: No upper extremity supported Standing balance-Leahy Scale: Fair  End of Session PT - End of Session Equipment Utilized During Treatment: Gait belt;Oxygen Activity Tolerance: Patient limited by fatigue Patient left: in bed;with call bell/phone within reach Nurse Communication: Mobility status   GP     Sunny SchleinRitenour, Blessing Zaucha F, [place] CarolinaPT [phone] 10/23/2013, 2:12 PM

## 2013-10-23 NOTE — Discharge Summary (Signed)
Physician Discharge Summary  Brett Michael ZOX:[id number] DOB: Apr 16, 1941 DOA: 10/21/2013  PCP: Orland Penman, MD  Admit date: 10/21/2013 Discharge date: 10/23/2013  Recommendations for Outpatient Follow-up:  1. Pt will need to follow up with PCP in 2 weeks post discharge 2. Please obtain BMP to evaluate electrolytes and kidney function 3. Please also check CBC to evaluate Hg and Hct levels  Discharge Diagnoses:  Principal Problem:   Stroke Active Problems:   Unspecified essential hypertension   Type II or unspecified type diabetes mellitus without mention of complication, not stated as uncontrolled   COPD (chronic obstructive pulmonary disease)   Acute embolic stroke   Tobacco use disorder   Aortic stenosis due to bicuspid aortic valve   Interatrial septal aneurysm with PFO  Brief history  73 year old male with a history of hypertension, diabetes mellitus, COPD presents with one-day history of headache and confusion. The patient was in his usual state of health until the morning of admission when he woke up with a headache, left sided neck pain and some word finding difficulties. There was no reports of fevers, chills, chest discomfort, worsening shortness of breath, vomiting, or diarrhea. The patient did have one episode of emesis after he was given morphine. The patient is chronically on 3 L nasal cannula at home for his COPD. CT of the brain showed a medial left occipital lobe hypoattenuation suspicious for a subcortical infarct. Chest x-ray showed emphysematous changes.  Workup was undertaken after neurology was consulted.  Assessment/Plan:  Acute left occipital lobe infarct  -Dysphasia/Delirium clinically resolved  -Given abnormal CT brain, concerned about stroke  -MRI brain--acute left occipital lobe infarct  -MRA brain--hyperdynamic left PCA flow, hyperdynamic posterior left MCA flow  -Echocardiogram (TTE)--EF 50-55%, grade 1 diastolic dysfunction, moderate aortic  stenosis  -Carotid Dopplers--no hemodynamically significant stenosis  -Lipid panel-LDL 110  -hemoglobin A1c--6.7 -As the patient was taking ASA prior to hospitalization, pt will be d/ced on plavix 75mg  daily -Urinalysis negative for pyuria -TEE showed small PFO, bicuspid aortic valve without vegetations, thrombi  -Loop recorder placed by Dr. Johney Frame during hospitalization -home with home health PT/OT Hypertension  -initially allowed permission HTN -restart amlodipine 5mg  daily -restart home accupril dose Dyslipidemia  -Start atorvastatin due to interaction of simvastatin with amlodipine COPD  -Clinically stable on 3 L nasal cannula  -Continue long acting beta agonist, Spiriva, theophylline  Tobacco abuse  -Patient chews tobacco  -Cessation discussed  -Nicoderm patch  Diabetes mellitus type 2  -Hemoglobin A1c--6.7  -Continue Lantus 12 units with NovoLog sliding scale  Leukocytosis  -No fever, hemodynamically stable  -Continue to monitor  -Improving off of antibiotics-  Family Communication: Son and daughter at beside  Disposition Plan: Home when medically stable  Discharge Condition: Stable  Disposition: Discharge home Follow-up Information   Follow up with Gates Rigg, MD. Schedule an appointment as soon as possible for a visit in 2 months. (stroke clinic)    Specialties:  Neurology, Radiology   Contact information:   [address] [place] [address] [phone]       Diet: Heart healthy Wt Readings from Last 3 Encounters:  10/21/13 87.091 kg (192 lb)  10/21/13 87.091 kg (192 lb)  10/21/13 87.091 kg (192 lb)      Consultants: Neurology Cardiology--Dr. Rennis Golden, Dr. Johney Frame  Discharge Exam: Filed Vitals:   10/23/13 1853  BP: 166/79  Pulse: 81  Temp: 98.8 F (37.1 C)  Resp: 20   Filed Vitals:   10/23/13 0940 10/23/13 1005 10/23/13  1348 10/23/13 1853  BP: 156/80 155/73 162/73 166/79  Pulse: 64 77 66 81  Temp:  98.5 F (36.9 C)  98 F (36.7 C) 98.8 F (37.1 C)  TempSrc:  Oral Oral Oral  Resp: 19 18 18 20   Height:      Weight:      SpO2: 94% 93% 94% 93%   General: A&O x 3, NAD, pleasant, cooperative Cardiovascular: RRR, no rub, no gallop, no S3 Respiratory: Scattered bilateral rales without any wheezing. Good air movement. Abdomen:soft, nontender, nondistended, positive bowel sounds Extremities: No edema, No lymphangitis, no petechiae  Discharge Instructions      Discharge Orders   Future Appointments Provider Department Dept Phone   10/31/2013 11:30 AM [name], MD [facility] [phone]   11/01/2013 2:00 PM [facility] [place] Office [phone]   12/06/2013 12:00 PM Lewayne Bunting, MD [facility] [phone]   Future Orders Complete By Expires   Diet - low sodium heart healthy  As directed    Increase activity slowly  As directed        Medication List    STOP taking these medications       aspirin EC 81 MG tablet      TAKE these medications       amLODipine 5 MG tablet  Commonly known as:  NORVASC  Take 1 tablet (5 mg total) by mouth daily.     atorvastatin 20 MG tablet  Commonly known as:  LIPITOR  Take 0.5 tablets (10 mg total) by mouth daily at 6 PM.  Start taking on:  10/24/2013     clopidogrel 75 MG tablet  Commonly known as:  PLAVIX  Take 1 tablet (75 mg total) by mouth daily with breakfast.  Start taking on:  10/24/2013     COMBIVENT RESPIMAT 20-100 MCG/ACT Aers respimat  Generic drug:  Ipratropium-Albuterol  Inhale 1 puff into the lungs every 6 (six) hours as needed for wheezing.     Fluticasone-Salmeterol 250-50 MCG/DOSE Aepb  Commonly known as:  ADVAIR  Inhale 1 puff into the lungs 2 (two) times daily.     insulin glargine 100 UNIT/ML injection  Commonly known as:  LANTUS  Inject 12 Units into the skin at bedtime.     metFORMIN 500 MG tablet  Commonly known as:  GLUCOPHAGE  Take 1 tablet (500 mg  total) by mouth 2 (two) times daily with a meal.     MULTIVITAMIN PO  Take 1 tablet by mouth daily.     beta carotene w/minerals tablet  Take 1 tablet by mouth 2 (two) times daily.     quinapril 40 MG tablet  Commonly known as:  ACCUPRIL  Take 40 mg by mouth every morning.     sertraline 100 MG tablet  Commonly known as:  ZOLOFT  Take 100 mg by mouth daily.     theophylline 400 MG 24 hr capsule  Commonly known as:  THEO-24  Take 400 mg by mouth daily.     tiotropium 18 MCG inhalation capsule  Commonly known as:  SPIRIVA  Place 18 mcg into inhaler and inhale daily.         The results of significant diagnostics from this hospitalization (including imaging, microbiology, ancillary and laboratory) are listed below for reference.    Significant Diagnostic Studies: Dg Chest 2 View  10/21/2013   CLINICAL DATA:  Altered mental status  EXAM: CHEST  2 VIEW  COMPARISON:  None.  FINDINGS: There is underlying emphysematous change. There are calcified granulomas on the left. There is no edema or consolidation. Heart is mildly enlarged. The pulmonary vascularity reflects underlying emphysema. No adenopathy. There is atherosclerotic change in the aorta. There is syndesmotic change in the thoracic spine.  IMPRESSION: Underlying emphysematous change. No edema or consolidation. Mild cardiomegaly. Evidence of calcified granulomas on the left.  There is evidence of syndesmotic change in the thoracic spine. 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   Electronically Signed   By: Bretta Bang M.D.   On: 10/21/2013 14:22   Ct Head Wo Contrast  10/21/2013   CLINICAL DATA:  Altered mental status. Slurred speech. Diabetic with hypertension.  EXAM: CT HEAD WITHOUT CONTRAST  TECHNIQUE: Contiguous axial images were obtained from the base of the skull through the vertex without intravenous contrast.  COMPARISON:  None.  FINDINGS: Sinuses/Soft tissues: Right  maxillary sinus mucous retention cyst or polyp. Mild motion degradation. Ethmoid air cell mucosal thickening. Clear mastoid air cells.  Intracranial: Advanced cerebral atrophy. Moderate low density in the periventricular white matter likely related to small vessel disease. Cerebellar atrophy which is also advanced. Cortically based hypoattenuation in the medial left occipital lobe, including on image 15/series 2. No hemorrhage, hydrocephalus, mass lesion, intra-axial, or extra-axial fluid collection.  IMPRESSION: 1. Advanced cerebral atrophy and small vessel ischemic change. 2. Mildly motion degraded exam. 3. Medial left occipital lobe cortically based hypoattenuation is suspicious for subacute infarct. These results were called by telephone at the time of interpretation on 10/21/2013 at 2:18 PM to Dr. Doug Sou , who verbally acknowledged these results.   Electronically Signed   By: Jeronimo Greaves M.D.   On: 10/21/2013 14:20   Mr Shirlee Latch Wo Contrast  10/22/2013   CLINICAL DATA:  73 year old male with headache and neck pain. Altered mental status at presentation. Dysphasia. Initial encounter.  EXAM: MRI HEAD WITHOUT CONTRAST  MRA HEAD WITHOUT CONTRAST  TECHNIQUE: Multiplanar, multiecho pulse sequences of the brain and surrounding structures were obtained without intravenous contrast. Angiographic images of the head were obtained using MRA technique without contrast.  COMPARISON:  Head CT without contrast 10/21/2013.  FINDINGS: MRI HEAD FINDINGS  3 cm confluent area of restricted diffusion in the left occipital pole (series 5 image 16). Confluent associated T2 and FLAIR hyperintensity with gyral enlargement most compatible with cytotoxic edema. No significant mass effect. No acute intracranial hemorrhage identified.  No other restricted diffusion identified. Major intracranial vascular flow voids are preserved with dominant, dolichoectatic distal left vertebral artery.  Patchy and confluent cerebral white matter  T2 and FLAIR hyperintensity superimposed. No chronic cortical encephalomalacia. Deep gray matter nuclei within normal limits. Mild patchy T2 hyperintensity in the pons. Cerebellum within normal limits. Negative pituitary, cervicomedullary junction and visualized cervical spine. No ventriculomegaly. No midline shift, mass effect, or evidence of intracranial mass lesion. Visible internal auditory structures appear normal.  Postoperative changes to the globes. Minimal paranasal sinus mucosal thickening. Trace right mastoid fluid. Normal bone marrow signal. Visualized scalp soft tissues are within normal limits.  MRA HEAD FINDINGS  Antegrade flow in the posterior circulation with dominant, dolichoectatic distal left vertebral artery. Nondominant right vertebral artery terminates in PICA. Normal left PICA origin. Tortuous basilar artery without stenosis. SCA and PCA origins are patent. Posterior communicating arteries are diminutive or absent.  Hyperdynamic left PCA flow. Otherwise negative bilateral PCA branches.  Antegrade flow in both ICA siphons. Suspect bilateral cavernous ICA atherosclerotic pseudo  lesions. Normal ophthalmic artery origins. No ICA siphon stenosis. Patent carotid termini. Normal MCA and ACA origins. Normal anterior communicating artery with median artery of the corpus callosum variant. Negative bilateral ACA branches. Negative bilateral MCA branches except for hyperdynamic posterior left MCA branch flow.  IMPRESSION: 1. Signal abnormality involving the left occipital pole most compatible with acute infarct. Cytotoxic edema but no significant mass effect. No intracranial hemorrhage. 2. Intracranial MRA suggesting hyperdynamic left PCA and posterior left MCA flow, such as due to luxury perfusion phenomena. Dolichoectatic dominant distal left vertebral artery. 3. Superimposed cerebral white matter signal changes, most commonly due to chronic small vessel disease.   Electronically Signed   By: Augusto Gamble  M.D.   On: 10/22/2013 10:26   Mr Brain Wo Contrast  10/22/2013   CLINICAL DATA:  73 year old male with headache and neck pain. Altered mental status at presentation. Dysphasia. Initial encounter.  EXAM: MRI HEAD WITHOUT CONTRAST  MRA HEAD WITHOUT CONTRAST  TECHNIQUE: Multiplanar, multiecho pulse sequences of the brain and surrounding structures were obtained without intravenous contrast. Angiographic images of the head were obtained using MRA technique without contrast.  COMPARISON:  Head CT without contrast 10/21/2013.  FINDINGS: MRI HEAD FINDINGS  3 cm confluent area of restricted diffusion in the left occipital pole (series 5 image 16). Confluent associated T2 and FLAIR hyperintensity with gyral enlargement most compatible with cytotoxic edema. No significant mass effect. No acute intracranial hemorrhage identified.  No other restricted diffusion identified. Major intracranial vascular flow voids are preserved with dominant, dolichoectatic distal left vertebral artery.  Patchy and confluent cerebral white matter T2 and FLAIR hyperintensity superimposed. No chronic cortical encephalomalacia. Deep gray matter nuclei within normal limits. Mild patchy T2 hyperintensity in the pons. Cerebellum within normal limits. Negative pituitary, cervicomedullary junction and visualized cervical spine. No ventriculomegaly. No midline shift, mass effect, or evidence of intracranial mass lesion. Visible internal auditory structures appear normal.  Postoperative changes to the globes. Minimal paranasal sinus mucosal thickening. Trace right mastoid fluid. Normal bone marrow signal. Visualized scalp soft tissues are within normal limits.  MRA HEAD FINDINGS  Antegrade flow in the posterior circulation with dominant, dolichoectatic distal left vertebral artery. Nondominant right vertebral artery terminates in PICA. Normal left PICA origin. Tortuous basilar artery without stenosis. SCA and PCA origins are patent. Posterior  communicating arteries are diminutive or absent.  Hyperdynamic left PCA flow. Otherwise negative bilateral PCA branches.  Antegrade flow in both ICA siphons. Suspect bilateral cavernous ICA atherosclerotic pseudo lesions. Normal ophthalmic artery origins. No ICA siphon stenosis. Patent carotid termini. Normal MCA and ACA origins. Normal anterior communicating artery with median artery of the corpus callosum variant. Negative bilateral ACA branches. Negative bilateral MCA branches except for hyperdynamic posterior left MCA branch flow.  IMPRESSION: 1. Signal abnormality involving the left occipital pole most compatible with acute infarct. Cytotoxic edema but no significant mass effect. No intracranial hemorrhage. 2. Intracranial MRA suggesting hyperdynamic left PCA and posterior left MCA flow, such as due to luxury perfusion phenomena. Dolichoectatic dominant distal left vertebral artery. 3. Superimposed cerebral white matter signal changes, most commonly due to chronic small vessel disease.   Electronically Signed   By: Augusto Gamble M.D.   On: 10/22/2013 10:26     Microbiology: No results found for this or any previous visit (from the past 240 hour(s)).   Labs: Basic Metabolic Panel:  Recent Labs Lab 10/21/13 1330 10/21/13 1840 10/22/13 1100 10/23/13 0421  NA 138  --  139 143  K  4.0  --  4.1 4.3  CL 97  --  100 102  CO2 26  --  25 27  GLUCOSE 197*  --  164* 170*  BUN 12  --  15 17  CREATININE 1.00 0.97 1.03 1.02  CALCIUM 9.3  --  9.4 8.7   Liver Function Tests:  Recent Labs Lab 10/21/13 1330  AST 14  ALT 16  ALKPHOS 79  BILITOT 0.9  PROT 7.2  ALBUMIN 3.9   No results found for this basename: LIPASE, AMYLASE,  in the last 168 hours No results found for this basename: AMMONIA,  in the last 168 hours CBC:  Recent Labs Lab 10/21/13 1330 10/22/13 1100 10/23/13 0421  WBC 12.4* 10.4 8.4  NEUTROABS 10.7*  --   --   HGB 14.3 14.7 13.6  HCT 41.9 42.8 39.7  MCV 89.7 89.5 89.4   PLT 157 135* 138*   Cardiac Enzymes:  Recent Labs Lab 10/21/13 1330  TROPONINI <0.30   BNP: No components found with this basename: POCBNP,  CBG:  Recent Labs Lab 10/22/13 1619 10/22/13 2015 10/23/13 0647 10/23/13 1049 10/23/13 1732  GLUCAP 161* 219* 156* 179* 154*    Time coordinating discharge:  Greater than 30 minutes  Signed:  Dionisia Pacholski, DO [facility] Pager: [phone] 10/23/2013, 7:56 PM

## 2013-10-23 NOTE — Progress Notes (Signed)
  Echocardiogram Transesophageal has been performed.  Brett Michael 10/23/2013, 9:00 AM

## 2013-10-23 NOTE — CV Procedure (Signed)
TRANSESOPHAGEAL ECHOCARDIOGRAM (TEE) NOTE  INDICATIONS: Cryptogenic stroke, aortic stenosis  PROCEDURE:   Informed consent was obtained prior to the procedure. The risks, benefits and alternatives for the procedure were discussed and the patient comprehended these risks.  Risks include, but are not limited to, cough, sore throat, vomiting, nausea, somnolence, esophageal and stomach trauma or perforation, bleeding, low blood pressure, aspiration, pneumonia, infection, trauma to the teeth and death.    After a procedural time-out, the patient was given 3 mg versed and 25 mcg fentanyl for moderate sedation.  The oropharynx was anesthetized 5 cc of topical 1% viscous lidocaine and 2 cetacaine sprays.  The transesophageal probe was inserted in the esophagus and stomach without difficulty and multiple views were obtained.  The patient was kept under observation until the patient left the procedure room.  The patient left the procedure room in stable condition.   Agitated microbubble saline contrast was administered.  COMPLICATIONS:    There were no immediate complications.  Findings:  1. LEFT VENTRICLE: The left ventricular wall thickness is mildly hypertrophied.  The left ventricular cavity is normal in size. Wall motion is mildly hypokinetic.  LVEF is 50-55%.  2. RIGHT VENTRICLE:  The right ventricle is normal in structure and function without any thrombus or masses.    3. LEFT ATRIUM:  The left atrium is top normal in size without any thrombus or masses.  There is not spontaneous echo contrast ("smoke") in the left atrium consistent with a low flow state.  4. LEFT ATRIAL APPENDAGE:  The left atrial appendage is free of any thrombus or masses. The appendage has single lobes. Pulse doppler indicates high flow in the appendage.  5. ATRIAL SEPTUM:  The atrial septum is aneurysmal.  There is a moderate-sized PFO with evidence for interatrial shunting by saline microbubble from right to left,  without provocation.  6. RIGHT ATRIUM:  The right atrium is normal in size and function without any thrombus or masses.  7. MITRAL VALVE:  The mitral valve is normal in structure and function with Mild regurgitation.  There were no vegetations or stenosis.  8. AORTIC VALVE:  The aortic valve is bicuspid and demonstrates moderate stenosis. The AVA by planimetry is 1.39 cm2. There is no regurgitation.   9. TRICUSPID VALVE:  The tricuspid valve is normal in structure and function with Mild regurgitation.  There were no vegetations or stenosis  10.  PULMONIC VALVE:  The pulmonic valve is normal in structure and function with trivial regurgitation.  There were no vegetations or stenosis.   11. AORTIC ARCH, ASCENDING AND DESCENDING AORTA:  There was grade 2 Myrtis Ser et. Al, 1992) atherosclerosis of the proximal descending aorta.  IMPRESSION:   1. LVEF 50-55%, mild global hypokinesis 2. Top normal LA size - normal LAA, no thrombus 3. Bicuspid aortic valve with moderate stenosis - AVA 1.39 cm2 by planimetry 4. Mild MR/TR and trace PI 5. Aneurysmal interatrial septum 6. Moderate PFO with right to left shunting by saline microbubble contrast. 7. 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  RECOMMENDATIONS:    1. Consider LE dopplers to r/o DVT as source of possible embolus. 2. Will need improved blood pressure control. 3. May need escalation in anticoagulation regimen since he was on aspirin low dose prior to admission. 4. I would be happy to follow his bicuspid aortic valve in the future as his cardiologist.  Time Spent Directly with the Patient:  60 minutes   [name], MD,  [facility] HospitalFACC Attending Cardiologist [facility]  10/23/2013, 8:52 AM

## 2013-10-23 NOTE — Progress Notes (Signed)
Stroke Team Progress Note  HISTORY Brett Michael is an 73 y.o. male with a history of diabetes mellitus, hypertension and COPD, who was seen in the emergency room at [facility] earlier today 10/21/2013 with a complaint of headache and neck pain as well as changes in vision and confusion with speech output that was nonsensical. Speech abnormality has resolved and headache has improved. CT scan of the head was obtained which showed a left medial occipital area of hypoattenuation suspicious for subacute stroke. NIH stroke scale was 2. Patient was last known well at 8 PM on 10/20/2013. He's been taking aspirin daily. He was beyond time for tPA treatment consideration. He was admitted for further evaluation and treatment.  SUBJECTIVE His daughter-in-law and son are at the bedside.  Overall he feels his condition is stable. He is lying in the bed.   OBJECTIVE Most recent Vital Signs: Filed Vitals:   10/23/13 0920 10/23/13 0930 10/23/13 0940 10/23/13 1005  BP: 151/78 169/77 156/80 155/73  Pulse: 65 65 64 77  Temp:    98.5 F (36.9 C)  TempSrc:    Oral  Resp: 18 19 19 18   Height:      Weight:      SpO2: 95% 93% 94% 93%   CBG (last 3)   Recent Labs  10/22/13 1619 10/22/13 2015 10/23/13 0647  GLUCAP 161* 219* 156*    IV Fluid Intake:   . sodium chloride 500 mL (10/23/13 0758)    MEDICATIONS  . aspirin EC  81 mg Oral Daily  . enoxaparin (LOVENOX) injection  40 mg Subcutaneous Q24H  . insulin aspart  0-9 Units Subcutaneous TID WC  . insulin glargine  12 Units Subcutaneous BH-[id number]  . mometasone-formoterol  2 puff Inhalation BID  . nicotine  14 mg Transdermal Daily  . sertraline  100 mg Oral Daily  . theophylline  400 mg Oral Daily  . tiotropium  18 mcg Inhalation Daily   PRN:  acetaminophen, hydrALAZINE, ipratropium-albuterol, ondansetron (ZOFRAN) IV, senna-docusate, traMADol  Diet:  Carb Control thin liquids Activity:  OOB with assistance DVT Prophylaxis:  Lovenox 40 mg sq daily    CLINICALLY SIGNIFICANT STUDIES Basic Metabolic Panel:   Recent Labs Lab 10/22/13 1100 10/23/13 0421  NA 139 143  K 4.1 4.3  CL 100 102  CO2 25 27  GLUCOSE 164* 170*  BUN 15 17  CREATININE 1.03 1.02  CALCIUM 9.4 8.7   Liver Function Tests:   Recent Labs Lab 10/21/13 1330  AST 14  ALT 16  ALKPHOS 79  BILITOT 0.9  PROT 7.2  ALBUMIN 3.9   CBC:   Recent Labs Lab 10/21/13 1330 10/22/13 1100 10/23/13 0421  WBC 12.4* 10.4 8.4  NEUTROABS 10.7*  --   --   HGB 14.3 14.7 13.6  HCT 41.9 42.8 39.7  MCV 89.7 89.5 89.4  PLT 157 135* 138*   Coagulation:   Recent Labs Lab 10/21/13 1330  LABPROT 12.5  INR 0.95   Cardiac Enzymes:   Recent Labs Lab 10/21/13 1330  TROPONINI <0.30   Urinalysis:   Recent Labs Lab 10/21/13 1748  COLORURINE YELLOW  LABSPEC 1.022  PHURINE 7.0  GLUCOSEU NEGATIVE  HGBUR NEGATIVE  BILIRUBINUR NEGATIVE  KETONESUR NEGATIVE  PROTEINUR 30*  UROBILINOGEN 0.2  NITRITE NEGATIVE  LEUKOCYTESUR NEGATIVE   Lipid Panel     Component Value Date/Time   CHOL 180 10/22/2013 1100   TRIG 174* 10/22/2013 1100   HDL 35* 10/22/2013 1100   CHOLHDL 5.1 10/22/2013 1100  VLDL 35 10/22/2013 1100   LDLCALC 110* 10/22/2013 1100    HgbA1C  Lab Results  Component Value Date   HGBA1C 6.7* 10/22/2013    Urine Drug Screen:   No results found for this basename: labopia,  cocainscrnur,  labbenz,  amphetmu,  thcu,  labbarb    Alcohol Level:   Recent Labs Lab 10/21/13 1330  ETH <11     CT of the brain  10/21/2013    1. Advanced cerebral atrophy and small vessel ischemic change. 2. Mildly motion degraded exam. 3. Medial left occipital lobe cortically based hypoattenuation is suspicious for subacute infarct.  MRI of the brain  10/22/2013    1. Signal abnormality involving the left occipital pole most compatible with acute infarct. Cytotoxic edema but no significant mass effect. No intracranial hemorrhage. 2. Superimposed cerebral white matter signal changes,  most commonly due to chronic small vessel disease.    MRA of the brain  10/22/2013   Intracranial MRA suggesting hyperdynamic left PCA and posterior left MCA flow, such as due to luxury perfusion phenomena. Dolichoectatic dominant distal left vertebral artery.   2D Echocardiogram  EF 50-55% with no source of embolus.   Carotid Doppler  No evidence of hemodynamically significant internal carotid artery stenosis. Vertebral artery flow is antegrade.   CXR  10/21/2013    Underlying emphysematous change. No edema or consolidation. Mild cardiomegaly. Evidence of calcified granulomas on the left.  There is evidence of syndesmotic change in the thoracic spine. 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     EKG  normal EKG, normal sinus rhythm. For complete results please see formal report.   Therapy Recommendations home health OT, PT  Physical Exam   Pleasant elderly male not in distress.Awake alert. Afebrile. Head is nontraumatic. Neck is supple without bruit. Hearing is normal. Cardiac exam no murmur or gallop. Lungs are clear to auscultation. Distal pulses are well felt. Neurological Exam ;  Awake  Alert oriented x 3. Normal speech and language.eye movements full without nystagmus.fundi were not visualized. Vision acuity  appear normal.Fields show partial right heminaopsia. Hearing is normal. Palatal movements are normal. Face symmetric. Tongue midline. Normal strength, tone, reflexes and coordination. Normal sensation. Gait deferred.  ASSESSMENT Mr. Brett Michael is a 73 y.o. male presenting with headache with new onset visual changes and transient speech abnormality with confusion. 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 Infarct felt to be embolic secondary to unknown source.  On aspirin 81 mg orally every day prior to admission. Now on aspirin 81 mg orally every day for secondary stroke prevention. Work up underway.  Diabetes, HgbA1c 6.7,  goal < 7.0 Hypertension Hyperlipidemia, LDL 110, on no statin PTA, now on no statin, goal LDL < 100 (< 70 for diabetics) PFO (patent foramen ovale) likely an incidental finding. LE venous dopplers for DVT are negative as possible cause of stroke. COPD  Hospital day # 2  TREATMENT/PLAN  Continue aspirin 81 mg orally every day for secondary stroke prevention.  Add statin  Home health PT and OT  As TEE and LE doppler negative,  placement of an implantable loop recorder to evaluate for atrial fibrillation as etiology of stroke will be placed after ablation.    Patient has a 10-15% risk of having another stroke over the next year, the highest risk is within 2 weeks of the most recent stroke/TIA (risk of having a stroke following a stroke or TIA is the same).  Ongoing risk factor  control by Primary Care Physician  Stroke Service will sign off. Please call should any needs arise.  Follow up with Dr. Pearlean Brownie, Stroke Clinic, in 2 months.    Annie Main, MSN, RN, ANVP-BC, ANP-BC, [facility] Pager: [phone] 10/23/2013 11:18 AM  I have personally obtained a history, examined the patient, evaluated imaging results, and formulated the assessment and plan of care. I agree with the above. Delia Heady, MD

## 2013-10-24 ENCOUNTER — Institutional Professional Consult (permissible substitution): Payer: Self-pay | Admitting: Emergency Medicine

## 2013-10-25 ENCOUNTER — Encounter (HOSPITAL_COMMUNITY): Payer: Self-pay | Admitting: Internal Medicine

## 2013-10-31 ENCOUNTER — Encounter: Payer: Self-pay | Admitting: Emergency Medicine

## 2013-10-31 ENCOUNTER — Ambulatory Visit (INDEPENDENT_AMBULATORY_CARE_PROVIDER_SITE_OTHER): Payer: Medicare Other | Admitting: *Deleted

## 2013-10-31 ENCOUNTER — Ambulatory Visit (INDEPENDENT_AMBULATORY_CARE_PROVIDER_SITE_OTHER): Payer: Medicare Other | Admitting: Emergency Medicine

## 2013-10-31 VITALS — BP 130/78 | HR 87 | Ht 66.0 in | Wt 194.0 lb

## 2013-10-31 DIAGNOSIS — J449 Chronic obstructive pulmonary disease, unspecified: Secondary | ICD-10-CM

## 2013-10-31 DIAGNOSIS — I639 Cerebral infarction, unspecified: Secondary | ICD-10-CM

## 2013-10-31 DIAGNOSIS — I6350 Cerebral infarction due to unspecified occlusion or stenosis of unspecified cerebral artery: Secondary | ICD-10-CM

## 2013-10-31 MED ORDER — AEROCHAMBER MINI CHAMBER DEVI: Status: AC

## 2013-10-31 NOTE — Assessment & Plan Note (Addendum)
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 He is on a good regimen, although I doubt any benefit from theophylline and the combivent is redundant with spiriva. I wouldn't increase the Advair at this time since this would only change the steroid dosing and he isn't having sputum, cough or freq exacerbations. Most important will be to keep him well oxygenated.  - continue 4L/min - continue same dosing Advair - continue spiriva - change combivent prn to ProAir prn with a spacer - stop theophylline  - will get PFT records from Dr Chilton SiGreen in [place].

## 2013-10-31 NOTE — Progress Notes (Signed)
Subjective:    Patient ID: Brett Michael, male    DOB: [birth year], [age] y.o.   MRN: [id number]  HPI 73 yo man, hx DM, HTN, mod AS, former tobacco (30 pk-yrs), COPD dx in . He was admitted for a cryptogenic L CVA in 10/2013. He had an implantable loop recorder placed to screen for asymptomatic A fib. During that hospitalization it was determined that he was hypoxemic - he was started on O2 at all times at 4L/min. Prior to that he was on O2 at 2L/min prn, mainly at night. He is on Spiriva and Advair, combivent prn that he uses up to 4x a day. He is also on theophylline and has been for years.   His biggest sx is exertional SOB. He has minimal cough, no wheeze, no sputum.   His last AE was several years ago.    Review of Systems  Constitutional: Negative for fever and unexpected weight change.  HENT: Positive for congestion, postnasal drip, sinus pressure and sneezing. Negative for dental problem, ear pain, nosebleeds, rhinorrhea, sore throat and trouble swallowing.   Eyes: Negative for redness and itching.  Respiratory: Positive for cough and shortness of breath. Negative for chest tightness and wheezing.   Cardiovascular: Negative for palpitations and leg swelling.  Gastrointestinal: Negative for nausea and vomiting.  Genitourinary: Negative for dysuria.  Musculoskeletal: Positive for neck pain and neck stiffness. Negative for joint swelling.  Skin: Negative for rash.  Neurological: Negative for headaches.  Hematological: Does not bruise/bleed easily.  Psychiatric/Behavioral: Negative for dysphoric mood. The patient is nervous/anxious.     Past Medical History  Diagnosis Date  . Diabetes mellitus without complication   . COPD (chronic obstructive pulmonary disease)   . Hypertension   . Renal disorder     kidney stones  . CVA (cerebral infarction)   . Aortic stenosis     moderate by echo 10/2013     Family History  Problem Relation Age of Onset  . COPD Father   . COPD Brother       History   Social History  . Marital Status: Widowed    Spouse Name: N/A    Number of Children: N/A  . Years of Education: N/A   Occupational History  . Not on file.   Social History Main Topics  . Smoking status: Former Smoker -- 1.00 packs/day for 30 years    Types: Cigarettes    Quit date: 09/14/2003  . Smokeless tobacco: Not on file  . Alcohol Use: No  . Drug Use: Not on file  . Sexual Activity: Not on file   Other Topics Concern  . Not on file   Social History Narrative  . 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  Raised and lived in [place], just moved to [place].   No Known Allergies   Outpatient Prescriptions Prior to Visit  Medication Sig Dispense Refill  . amLODipine (NORVASC) 5 MG tablet Take 1 tablet (5 mg total) by mouth daily.  30 tablet  0  . atorvastatin (LIPITOR) 20 MG tablet Take 0.5 tablets (10 mg total) by mouth daily at 6 PM.  30 tablet  0  . beta carotene w/minerals (OCUVITE) tablet Take 1 tablet by mouth 2 (two) times daily.      . clopidogrel (PLAVIX) 75 MG tablet Take 1 tablet (75 mg total) by mouth daily with breakfast.  30 tablet  0  . Fluticasone-Salmeterol (ADVAIR) 250-50  MCG/DOSE AEPB Inhale 1 puff into the lungs 2 (two) times daily.      . insulin glargine (LANTUS) 100 UNIT/ML injection Inject 12 Units into the skin at bedtime.      . metFORMIN (GLUCOPHAGE) 500 MG tablet Take 1 tablet (500 mg total) by mouth 2 (two) times daily with a meal.  60 tablet  0  . Multiple Vitamins-Minerals (MULTIVITAMIN PO) Take 1 tablet by mouth daily.      . quinapril (ACCUPRIL) 40 MG tablet Take 40 mg by mouth every morning.      . sertraline (ZOLOFT) 100 MG tablet Take 100 mg by mouth daily.      Marland Kitchen tiotropium (SPIRIVA) 18 MCG inhalation capsule Place 18 mcg into inhaler and inhale daily.      . Ipratropium-Albuterol (COMBIVENT RESPIMAT) 20-100 MCG/ACT AERS respimat Inhale 1 puff into the lungs every 6 (six) hours as  needed for wheezing.      . theophylline (THEO-24) 400 MG 24 hr capsule Take 400 mg by mouth daily.       No facility-administered medications prior to visit.       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   Neck: No JVD, no TMG, no carotid bruits  Lungs: No use of accessory muscles, distant, clear without rales or rhonchi  Cardiovascular: RRR, heart sounds normal, no murmur or gallops, no peripheral edema  Musculoskeletal: No deformities, no cyanosis or clubbing  Neuro: alert, non focal  Skin: Warm, no lesions or rashes, apparent basal cell CA L forehead.       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 He is on a good regimen, although I doubt any benefit from theophylline and the combivent is redundant with spiriva. I wouldn't increase the Advair at this time since this would only change the steroid dosing and he isn't having sputum, cough or freq exacerbations. Most important will be to keep him well oxygenated.  - continue 4L/min - continue same dosing Advair - continue spiriva - change combivent prn to ProAir prn with a spacer - stop theophylline  - will get PFT records from Dr Chilton Si in [place].

## 2013-10-31 NOTE — Patient Instructions (Signed)
Please continue your Spiriva daily  Please continue your Advair twice a day Stop Theophylline for now Stop Combivent for now. We will start ProAir 2 puffs up to every 4 hours if needed for shortness of breath We will get your records from [place], including your pulmonary function testing.  Follow with Dr Delton CoombesByrum in 2 months or sooner if you have any problems.

## 2013-11-01 ENCOUNTER — Ambulatory Visit: Payer: Medicare Other

## 2013-11-01 LAB — MDC_IDC_ENUM_SESS_TYPE_INCLINIC
MDC IDC SESS DTM: 20150218190920
MDC IDC SET ZONE DETECTION INTERVAL: 2000 ms
MDC IDC SET ZONE DETECTION INTERVAL: 390 ms
Zone Setting Detection Interval: 3000 ms

## 2013-11-01 NOTE — Progress Notes (Signed)
Wound check in clinic s/p linq insertion for cryptogenic CVA. Wound well healed without redness or edema.  Pt with 0 tachy episodes; 0 brady episodes; 0 asystole. Plan to follow up via [facility] qmo and with JA PRN.

## 2013-11-15 ENCOUNTER — Encounter: Payer: Self-pay | Admitting: Internal Medicine

## 2013-11-20 ENCOUNTER — Telehealth: Payer: Self-pay | Admitting: Emergency Medicine

## 2013-11-20 NOTE — Telephone Encounter (Signed)
Received 21 pages from [facility], sent to Dr. Delton CoombesByrum. 11/20/13/ss.

## 2013-11-23 ENCOUNTER — Ambulatory Visit (INDEPENDENT_AMBULATORY_CARE_PROVIDER_SITE_OTHER): Payer: Medicare Other | Admitting: *Deleted

## 2013-11-23 DIAGNOSIS — I639 Cerebral infarction, unspecified: Secondary | ICD-10-CM

## 2013-11-23 DIAGNOSIS — I6350 Cerebral infarction due to unspecified occlusion or stenosis of unspecified cerebral artery: Secondary | ICD-10-CM

## 2013-11-23 LAB — MDC_IDC_ENUM_SESS_TYPE_REMOTE

## 2013-12-06 ENCOUNTER — Ambulatory Visit (INDEPENDENT_AMBULATORY_CARE_PROVIDER_SITE_OTHER): Payer: Medicare Other | Admitting: Internal Medicine

## 2013-12-06 ENCOUNTER — Encounter: Payer: Self-pay | Admitting: Internal Medicine

## 2013-12-06 ENCOUNTER — Ambulatory Visit: Payer: Medicare Other | Admitting: Cardiology

## 2013-12-06 VITALS — BP 120/82 | HR 93 | Ht 66.0 in | Wt 194.2 lb

## 2013-12-06 DIAGNOSIS — I359 Nonrheumatic aortic valve disorder, unspecified: Secondary | ICD-10-CM

## 2013-12-06 DIAGNOSIS — I10 Essential (primary) hypertension: Secondary | ICD-10-CM

## 2013-12-06 DIAGNOSIS — I6340 Cerebral infarction due to embolism of unspecified cerebral artery: Secondary | ICD-10-CM

## 2013-12-06 DIAGNOSIS — Q2111 Secundum atrial septal defect: Secondary | ICD-10-CM

## 2013-12-06 DIAGNOSIS — Q231 Congenital insufficiency of aortic valve: Secondary | ICD-10-CM

## 2013-12-06 DIAGNOSIS — Q211 Atrial septal defect: Secondary | ICD-10-CM

## 2013-12-06 DIAGNOSIS — Q230 Congenital stenosis of aortic valve: Secondary | ICD-10-CM

## 2013-12-06 DIAGNOSIS — I639 Cerebral infarction, unspecified: Secondary | ICD-10-CM

## 2013-12-06 DIAGNOSIS — I6350 Cerebral infarction due to unspecified occlusion or stenosis of unspecified cerebral artery: Secondary | ICD-10-CM

## 2013-12-06 DIAGNOSIS — I253 Aneurysm of heart: Secondary | ICD-10-CM

## 2013-12-06 NOTE — Patient Instructions (Signed)
Your physician wants you to follow-up in: 1 year with Dr. Rennis GoldenHilty. You will receive a reminder letter in the mail two months in advance. If you don't receive a letter, please call our office to schedule the follow-up appointment.  Your physician has requested that you have an echocardiogram. Echocardiography is a painless test that uses sound waves to create images of your heart. It provides your doctor with information about the size and shape of your heart and how well your heart's chambers and valves are working. This procedure takes approximately one hour. There are no restrictions for this procedure. ** please schedule this prior to your next office visit (in 1 year)

## 2013-12-17 ENCOUNTER — Telehealth: Payer: Self-pay | Admitting: *Deleted

## 2013-12-17 MED ORDER — ATORVASTATIN CALCIUM 20 MG PO TABS: 10.0000 mg | ORAL_TABLET | Freq: Every day | ORAL | Status: DC

## 2013-12-17 NOTE — Telephone Encounter (Signed)
Pt's daughter in law called in regards to Mr. Stowers's Liptor needing to be refilled. Please call it in to [facility].   CH

## 2013-12-17 NOTE — Telephone Encounter (Signed)
Spoke to Daughter in Social workerlaw and let her know that the Rx refill had been sent to [facility].

## 2013-12-18 ENCOUNTER — Encounter: Payer: Self-pay | Admitting: Internal Medicine

## 2013-12-18 NOTE — Progress Notes (Signed)
OFFICE NOTE  Chief Complaint:  Hospital follow-up  Primary Care Physician: Raelyn Mora, Brent Bulla, MD  HPI:  Brett Michael was admitted on 10/21/2013 with changes in vision and confusion. Imaging demonstrated left occipital lobe infarct. He has undergone workup for stroke including echocardiogram and carotid dopplers. The patient has been monitored on telemetry which has demonstrated sinus rhythm with no arrhythmias. Inpatient stroke work-up is to be completed with a TEE. Echocardiogram this admission demonstrated an EF of 50-55%, no RWMA, moderate AS, LA 36. Lab work has been Unremarkable except for HgbA1C of 6.7.  Brett Michael recently moved to [place] from [place]. He has never seen a cardiologist before. Past medical history is significant for COPD, hypertension, and diabetes. He is scheduled to establish with Dr Delton Coombes tomorrow for pulmonary follow-up.  Prior to admission, the patient denies chest pain, shortness of breath, dizziness, palpitations, or syncope. They are recovering from their stroke with plans to return home at discharge.  EP was consulted and recommended an implantable loop recorder to monitor for atrial fibrillation. I performed his TEE which demonstrated the following:  Left ventricle: There was mild concentric hypertrophy. Systolic function was normal. The estimated ejection fraction was in the range of 50% to 55%. Mild diffuse hypokinesis. - Aortic valve: Bicuspid aortic valve with moderate aortic stenosis - AVA by planimetry is 1.39 cm2. - Aorta: Mild grade 2 atherosclerosis of the proximal descending aorta. - Mitral valve: Mild regurgitation. - Left atrium: The atrium was dilated. No evidence of thrombus in the atrial cavity or appendage. - Pulmonary veins: No anomalous connections. - Right atrium: No evidence of thrombus in the atrial cavity or appendage. - Atrial septum: The atrial septum is aneurysmal. There is small to moderate sized PFO with  right to left shunting by saline microbubble. - Pericardium, extracardiac: There was no pericardial effusion. Impressions:  - Small to moderate sized PFO with right to left shunting. Bicuspid aortic valve with moderate stenosis.  Based on these findings, he will require close follow-up of his bicuspid aortic valve.  PMHx:  Past Medical History  Diagnosis Date  . Diabetes mellitus without complication   . COPD (chronic obstructive pulmonary disease)   . Hypertension   . Renal disorder     kidney stones  . CVA (cerebral infarction)   . Aortic stenosis     moderate by echo 10/2013    Past Surgical History  Procedure Laterality Date  . Loop recorder implant  10/23/13    MDT LinQ implanted by Dr Johney Frame for cryptogenic stroke  . Tee without cardioversion N/A 10/23/2013    Procedure: TRANSESOPHAGEAL ECHOCARDIOGRAM (TEE);  Surgeon: [name], MD;  [facility] END[facility];  Service: Cardiovascular;  Laterality: N/A;    FAMHx:  Family History  Problem Relation Age of Onset  . COPD Father   . COPD Brother     SOCHx:   reports that he quit smoking about 10 years ago. His smoking use included Cigarettes. He has a 30 pack-year smoking history. His smokeless tobacco use includes Chew. He reports that he does not drink alcohol. His drug history is not on file.  ALLERGIES:  No Known Allergies  ROS: A comprehensive review of systems was negative except for: Cardiovascular: positive for dyspnea Neurological: positive for recent stroke  HOME MEDS: Current Outpatient Prescriptions  Medication Sig Dispense Refill  . albuterol (PROVENTIL HFA;VENTOLIN HFA) 108 (90 BASE) MCG/ACT inhaler Inhale 1 puff into the lungs every 6 (six) hours as needed for wheezing or  shortness of breath.      Marland Kitchen amLODipine (NORVASC) 5 MG tablet Take 1 tablet (5 mg total) by mouth daily.  30 tablet  0  . beta carotene w/minerals (OCUVITE) tablet Take 1 tablet by mouth 2 (two) times daily.      . clopidogrel  (PLAVIX) 75 MG tablet Take 1 tablet (75 mg total) by mouth daily with breakfast.  30 tablet  0  . Fluticasone-Salmeterol (ADVAIR) 250-50 MCG/DOSE AEPB Inhale 1 puff into the lungs 2 (two) times daily.      . insulin glargine (LANTUS) 100 UNIT/ML injection Inject 12 Units into the skin at bedtime.      . metFORMIN (GLUCOPHAGE) 500 MG tablet Take 1 tablet (500 mg total) by mouth 2 (two) times daily with a meal.  60 tablet  0  . Multiple Vitamins-Minerals (MULTIVITAMIN PO) Take 1 tablet by mouth daily.      . OXYGEN-HELIUM IN Inhale 4 L into the lungs continuous.      . quinapril (ACCUPRIL) 40 MG tablet Take 40 mg by mouth every morning.      . sertraline (ZOLOFT) 100 MG tablet Take 100 mg by mouth daily.      Marland Kitchen Spacer/Aero-Holding Chambers (AEROCHAMBER MINI CHAMBER) DEVI Use as directed  1 Device  0  . theophylline (THEO-24) 400 MG 24 hr capsule Take 400 mg by mouth daily.      Marland Kitchen tiotropium (SPIRIVA) 18 MCG inhalation capsule Place 18 mcg into inhaler and inhale daily.      Marland Kitchen atorvastatin (LIPITOR) 20 MG tablet Take 0.5 tablets (10 mg total) by mouth daily at 6 PM.  30 tablet  6   No current facility-administered medications for this visit.    LABS/IMAGING: No results found for this or any previous visit (from the past 48 hour(s)). No results found.  VITALS: BP 120/82  Pulse 93  Ht 5\' 6"  (1.676 m)  Wt 194 lb 3.2 oz (88.089 kg)  BMI 31.36 kg/m2  SpO2 97%  EXAM: General appearance: alert and no distress Neck: no carotid bruit and no JVD Lungs: clear to auscultation bilaterally Heart: regular rate and rhythm, S1, S2 normal and systolic murmur: systolic ejection 3/6, crescendo at 2nd right intercostal space Abdomen: soft, non-tender; bowel sounds normal; no masses,  no organomegaly Extremities: extremities normal, atraumatic, no cyanosis or edema Pulses: 2+ and symmetric Skin: Skin color, texture, turgor normal. No rashes or lesions Neurologic: Grossly normal Psych: Mood, affect  normal  EKG: Normal sinus rhythm at 93  ASSESSMENT: 1. Bicuspid aortic valve - moderate aortic stenosis. 2. Small PFO 3. Recent stroke 4. Implanted loop recorder to detect possible arrhythmia  PLAN: 1.   Brett Michael is doing well and recovering from a stroke. This study showed a small PFO however this was not thought to be implicated in his stroke. There was concern about possible occult atrial fibrillation and he is currently fitted with an implantable loop recorder. His echocardiogram did show moderate aortic stenosis and there is clearly a bicuspid aortic valve. This will need to be followed closely. Since he is ready 73 years of age, the calcification of the valve and stenosis appears to be progressing slowly. It is difficult to assess whether or not he will need valve replacement in the next few years.  Plan to see him back in one year with an echocardiogram just prior to the visit so that we can review it  [name], MD, [facility] Attending Cardiologist [facility]  HILTY,Kenneth  C 12/18/2013, 9:23 PM

## 2013-12-24 ENCOUNTER — Ambulatory Visit (INDEPENDENT_AMBULATORY_CARE_PROVIDER_SITE_OTHER): Payer: Medicare Other | Admitting: *Deleted

## 2013-12-24 DIAGNOSIS — I639 Cerebral infarction, unspecified: Secondary | ICD-10-CM

## 2013-12-24 DIAGNOSIS — I6350 Cerebral infarction due to unspecified occlusion or stenosis of unspecified cerebral artery: Secondary | ICD-10-CM

## 2014-01-03 ENCOUNTER — Telehealth: Payer: Self-pay | Admitting: Emergency Medicine

## 2014-01-03 NOTE — Telephone Encounter (Signed)
Fax request was sent for records from Dr. Noah CharonNelson greene. These records have been received as of 01/03/14 and placed in RB's look at folder. Nothing further is needed

## 2014-01-16 ENCOUNTER — Encounter: Payer: Self-pay | Admitting: Internal Medicine

## 2014-01-16 ENCOUNTER — Other Ambulatory Visit: Payer: Self-pay | Admitting: *Deleted

## 2014-01-16 MED ORDER — AMLODIPINE BESYLATE 5 MG PO TABS: 5.0000 mg | ORAL_TABLET | Freq: Every day | ORAL | Status: DC

## 2014-01-23 ENCOUNTER — Ambulatory Visit (INDEPENDENT_AMBULATORY_CARE_PROVIDER_SITE_OTHER): Payer: Medicare Other | Admitting: *Deleted

## 2014-01-23 DIAGNOSIS — I6350 Cerebral infarction due to unspecified occlusion or stenosis of unspecified cerebral artery: Secondary | ICD-10-CM

## 2014-01-23 DIAGNOSIS — I639 Cerebral infarction, unspecified: Secondary | ICD-10-CM

## 2014-02-25 ENCOUNTER — Ambulatory Visit (INDEPENDENT_AMBULATORY_CARE_PROVIDER_SITE_OTHER): Payer: Medicare Other | Admitting: *Deleted

## 2014-02-25 DIAGNOSIS — I639 Cerebral infarction, unspecified: Secondary | ICD-10-CM

## 2014-02-25 DIAGNOSIS — I6350 Cerebral infarction due to unspecified occlusion or stenosis of unspecified cerebral artery: Secondary | ICD-10-CM

## 2014-02-27 NOTE — Progress Notes (Signed)
Loop recorder 

## 2014-03-05 LAB — MDC_IDC_ENUM_SESS_TYPE_REMOTE

## 2014-03-25 ENCOUNTER — Encounter: Payer: Self-pay | Admitting: Internal Medicine

## 2014-03-27 ENCOUNTER — Ambulatory Visit (INDEPENDENT_AMBULATORY_CARE_PROVIDER_SITE_OTHER): Payer: Medicare Other | Admitting: *Deleted

## 2014-03-27 DIAGNOSIS — I639 Cerebral infarction, unspecified: Secondary | ICD-10-CM

## 2014-03-27 DIAGNOSIS — I6350 Cerebral infarction due to unspecified occlusion or stenosis of unspecified cerebral artery: Secondary | ICD-10-CM

## 2014-03-28 LAB — MDC_IDC_ENUM_SESS_TYPE_REMOTE

## 2014-04-01 NOTE — Progress Notes (Signed)
Loop recorder 

## 2014-04-23 ENCOUNTER — Telehealth: Payer: Self-pay | Admitting: Emergency Medicine

## 2014-04-23 MED ORDER — ALBUTEROL SULFATE HFA 108 (90 BASE) MCG/ACT IN AERS: 1.0000 | INHALATION_SPRAY | Freq: Four times a day (QID) | RESPIRATORY_TRACT | Status: AC | PRN

## 2014-04-23 NOTE — Telephone Encounter (Signed)
Called spoke with pt spouse. Aware RX has been sent. Nothing further needed

## 2014-04-26 ENCOUNTER — Ambulatory Visit (INDEPENDENT_AMBULATORY_CARE_PROVIDER_SITE_OTHER): Payer: Medicare Other | Admitting: *Deleted

## 2014-04-26 DIAGNOSIS — I639 Cerebral infarction, unspecified: Secondary | ICD-10-CM

## 2014-04-26 DIAGNOSIS — I6350 Cerebral infarction due to unspecified occlusion or stenosis of unspecified cerebral artery: Secondary | ICD-10-CM

## 2014-04-26 LAB — MDC_IDC_ENUM_SESS_TYPE_REMOTE
Date Time Interrogation Session: 20150824202032
MDC IDC SET ZONE DETECTION INTERVAL: 3000 ms
Zone Setting Detection Interval: 2000 ms
Zone Setting Detection Interval: 390 ms

## 2014-05-03 NOTE — Progress Notes (Signed)
Loop recorder 

## 2014-05-23 LAB — MDC_IDC_ENUM_SESS_TYPE_REMOTE

## 2014-05-27 ENCOUNTER — Ambulatory Visit (INDEPENDENT_AMBULATORY_CARE_PROVIDER_SITE_OTHER): Payer: Medicare Other | Admitting: *Deleted

## 2014-05-27 DIAGNOSIS — I639 Cerebral infarction, unspecified: Secondary | ICD-10-CM

## 2014-05-27 DIAGNOSIS — I6350 Cerebral infarction due to unspecified occlusion or stenosis of unspecified cerebral artery: Secondary | ICD-10-CM

## 2014-05-30 ENCOUNTER — Encounter: Payer: Self-pay | Admitting: Internal Medicine

## 2014-05-30 NOTE — Progress Notes (Signed)
Loop recorder 

## 2014-06-05 ENCOUNTER — Encounter: Payer: Self-pay | Admitting: Internal Medicine

## 2014-06-11 LAB — MDC_IDC_ENUM_SESS_TYPE_REMOTE

## 2014-06-18 ENCOUNTER — Encounter: Payer: Self-pay | Admitting: Internal Medicine

## 2014-06-25 ENCOUNTER — Ambulatory Visit (INDEPENDENT_AMBULATORY_CARE_PROVIDER_SITE_OTHER): Payer: Medicare Other | Admitting: *Deleted

## 2014-06-25 DIAGNOSIS — I639 Cerebral infarction, unspecified: Secondary | ICD-10-CM

## 2014-06-25 LAB — MDC_IDC_ENUM_SESS_TYPE_REMOTE

## 2014-06-28 NOTE — Progress Notes (Signed)
Loop recorder 

## 2014-07-04 ENCOUNTER — Other Ambulatory Visit: Payer: Self-pay

## 2014-07-04 MED ORDER — AMLODIPINE BESYLATE 5 MG PO TABS: 5.0000 mg | ORAL_TABLET | Freq: Every day | ORAL | Status: DC

## 2014-07-04 NOTE — Telephone Encounter (Signed)
Rx sent to pharmacy   

## 2014-07-18 ENCOUNTER — Encounter: Payer: Self-pay | Admitting: Internal Medicine

## 2014-07-25 ENCOUNTER — Ambulatory Visit (INDEPENDENT_AMBULATORY_CARE_PROVIDER_SITE_OTHER): Payer: Medicare Other | Admitting: *Deleted

## 2014-07-25 DIAGNOSIS — I639 Cerebral infarction, unspecified: Secondary | ICD-10-CM

## 2014-07-25 LAB — MDC_IDC_ENUM_SESS_TYPE_REMOTE

## 2014-07-31 NOTE — Progress Notes (Signed)
Loop recorder 

## 2014-08-19 ENCOUNTER — Encounter: Payer: Self-pay | Admitting: Internal Medicine

## 2014-08-22 ENCOUNTER — Encounter (HOSPITAL_COMMUNITY): Payer: Self-pay | Admitting: Internal Medicine

## 2014-08-23 ENCOUNTER — Ambulatory Visit (INDEPENDENT_AMBULATORY_CARE_PROVIDER_SITE_OTHER): Payer: Medicare Other | Admitting: *Deleted

## 2014-08-23 DIAGNOSIS — I6340 Cerebral infarction due to embolism of unspecified cerebral artery: Secondary | ICD-10-CM

## 2014-08-23 DIAGNOSIS — I639 Cerebral infarction, unspecified: Secondary | ICD-10-CM

## 2014-08-23 LAB — MDC_IDC_ENUM_SESS_TYPE_REMOTE

## 2014-08-28 NOTE — Progress Notes (Signed)
Loop recorder 

## 2014-09-23 ENCOUNTER — Ambulatory Visit (INDEPENDENT_AMBULATORY_CARE_PROVIDER_SITE_OTHER): Payer: PPO | Admitting: *Deleted

## 2014-09-23 DIAGNOSIS — I639 Cerebral infarction, unspecified: Secondary | ICD-10-CM

## 2014-09-23 DIAGNOSIS — I6340 Cerebral infarction due to embolism of unspecified cerebral artery: Secondary | ICD-10-CM

## 2014-09-27 NOTE — Progress Notes (Signed)
Loop recorder 

## 2014-10-08 ENCOUNTER — Encounter: Payer: Self-pay | Admitting: Internal Medicine

## 2014-10-22 LAB — MDC_IDC_ENUM_SESS_TYPE_REMOTE

## 2014-10-24 ENCOUNTER — Ambulatory Visit (INDEPENDENT_AMBULATORY_CARE_PROVIDER_SITE_OTHER): Payer: PPO | Admitting: *Deleted

## 2014-10-24 DIAGNOSIS — I6340 Cerebral infarction due to embolism of unspecified cerebral artery: Secondary | ICD-10-CM

## 2014-10-24 DIAGNOSIS — I639 Cerebral infarction, unspecified: Secondary | ICD-10-CM

## 2014-10-24 NOTE — Progress Notes (Signed)
Loop recorder 

## 2014-10-31 ENCOUNTER — Other Ambulatory Visit: Payer: Self-pay

## 2014-10-31 MED ORDER — ATORVASTATIN CALCIUM 20 MG PO TABS: 10.0000 mg | ORAL_TABLET | Freq: Every day | ORAL | Status: DC

## 2014-10-31 NOTE — Telephone Encounter (Signed)
Rx(s) sent to pharmacy electronically.  

## 2014-11-03 LAB — MDC_IDC_ENUM_SESS_TYPE_REMOTE

## 2014-11-07 ENCOUNTER — Encounter: Payer: Self-pay | Admitting: Internal Medicine

## 2014-11-15 ENCOUNTER — Encounter: Payer: Self-pay | Admitting: Internal Medicine

## 2014-11-22 ENCOUNTER — Ambulatory Visit (INDEPENDENT_AMBULATORY_CARE_PROVIDER_SITE_OTHER): Payer: PPO | Admitting: *Deleted

## 2014-11-22 DIAGNOSIS — I639 Cerebral infarction, unspecified: Secondary | ICD-10-CM

## 2014-11-22 DIAGNOSIS — I6340 Cerebral infarction due to embolism of unspecified cerebral artery: Secondary | ICD-10-CM | POA: Diagnosis not present

## 2014-11-28 NOTE — Progress Notes (Signed)
Loop recorder 

## 2014-11-29 ENCOUNTER — Other Ambulatory Visit: Payer: Self-pay | Admitting: *Deleted

## 2014-11-29 MED ORDER — AMLODIPINE BESYLATE 5 MG PO TABS: 5.0000 mg | ORAL_TABLET | Freq: Every day | ORAL | Status: DC

## 2014-11-29 MED ORDER — ATORVASTATIN CALCIUM 20 MG PO TABS: 10.0000 mg | ORAL_TABLET | Freq: Every day | ORAL | Status: DC

## 2014-11-29 NOTE — Telephone Encounter (Signed)
Rx refill sent to patient pharmacy   

## 2014-11-29 NOTE — Telephone Encounter (Signed)
Rx(s) sent to pharmacy electronically.  

## 2014-12-10 ENCOUNTER — Ambulatory Visit (HOSPITAL_COMMUNITY): Payer: Medicare Other

## 2014-12-10 LAB — MDC_IDC_ENUM_SESS_TYPE_REMOTE

## 2014-12-18 ENCOUNTER — Ambulatory Visit (HOSPITAL_COMMUNITY)
Admission: RE | Admit: 2014-12-18 | Discharge: 2014-12-18 | Disposition: A | Discharge status: Home / Self Care | Payer: PPO | Source: Ambulatory Visit | Attending: Internal Medicine | Admitting: Internal Medicine

## 2014-12-18 DIAGNOSIS — Q231 Congenital insufficiency of aortic valve: Secondary | ICD-10-CM | POA: Diagnosis present

## 2014-12-18 DIAGNOSIS — I350 Nonrheumatic aortic (valve) stenosis: Secondary | ICD-10-CM

## 2014-12-18 DIAGNOSIS — I6340 Cerebral infarction due to embolism of unspecified cerebral artery: Secondary | ICD-10-CM | POA: Diagnosis not present

## 2014-12-18 NOTE — Progress Notes (Signed)
2D Echo Performed 12/18/2014    Miyoshi Ligas, RCS  

## 2014-12-19 ENCOUNTER — Ambulatory Visit (HOSPITAL_COMMUNITY): Payer: PPO

## 2014-12-20 ENCOUNTER — Encounter: Payer: Self-pay | Admitting: Internal Medicine

## 2014-12-23 ENCOUNTER — Ambulatory Visit (INDEPENDENT_AMBULATORY_CARE_PROVIDER_SITE_OTHER): Payer: PPO | Admitting: *Deleted

## 2014-12-23 DIAGNOSIS — I639 Cerebral infarction, unspecified: Secondary | ICD-10-CM

## 2014-12-23 DIAGNOSIS — I6340 Cerebral infarction due to embolism of unspecified cerebral artery: Secondary | ICD-10-CM

## 2014-12-24 NOTE — Progress Notes (Signed)
Loop recorder 

## 2015-01-01 ENCOUNTER — Other Ambulatory Visit: Payer: Self-pay

## 2015-01-01 MED ORDER — AMLODIPINE BESYLATE 5 MG PO TABS: 5.0000 mg | ORAL_TABLET | Freq: Every day | ORAL | Status: DC

## 2015-01-01 NOTE — Telephone Encounter (Signed)
Rx(s) sent to pharmacy electronically.  

## 2015-01-10 ENCOUNTER — Encounter: Payer: Self-pay | Admitting: Cardiology

## 2015-01-22 ENCOUNTER — Ambulatory Visit (INDEPENDENT_AMBULATORY_CARE_PROVIDER_SITE_OTHER): Payer: PPO | Admitting: *Deleted

## 2015-01-22 DIAGNOSIS — I6340 Cerebral infarction due to embolism of unspecified cerebral artery: Secondary | ICD-10-CM | POA: Diagnosis not present

## 2015-01-22 DIAGNOSIS — I639 Cerebral infarction, unspecified: Secondary | ICD-10-CM

## 2015-01-23 ENCOUNTER — Telehealth: Payer: Self-pay | Admitting: *Deleted

## 2015-01-23 MED ORDER — RIVAROXABAN 20 MG PO TABS: 20.0000 mg | ORAL_TABLET | Freq: Every day | ORAL | Status: DC

## 2015-01-23 NOTE — Telephone Encounter (Signed)
Follow Up   Returning call from earlier regarding medication change. Please call.

## 2015-01-23 NOTE — Telephone Encounter (Signed)
Left a message for patient to call me in regards to his LINQ transmission.  Dr Johney FrameAllred wants him to stop Plavix, start Xarelto 20 mg daily and follow up with Gypsy BalsamAmber Seiler, NP next available

## 2015-01-23 NOTE — Telephone Encounter (Signed)
Spoke with daughter and she is aware.  He had unplugged his monitor and that's why we had not been getting transmissions.  It is connected and we are receiving transmissions.  She is aware of appointment and will have him here

## 2015-01-24 NOTE — Progress Notes (Signed)
Loop recorder 

## 2015-01-27 LAB — CUP PACEART REMOTE DEVICE CHECK: Date Time Interrogation Session: 20160506040500

## 2015-01-28 ENCOUNTER — Encounter: Payer: Self-pay | Admitting: Internal Medicine

## 2015-01-29 ENCOUNTER — Ambulatory Visit (INDEPENDENT_AMBULATORY_CARE_PROVIDER_SITE_OTHER): Payer: PPO | Admitting: Nurse Practitioner

## 2015-01-29 ENCOUNTER — Encounter: Payer: Self-pay | Admitting: Nurse Practitioner

## 2015-01-29 VITALS — BP 160/70 | HR 82 | Ht 66.0 in | Wt 202.0 lb

## 2015-01-29 DIAGNOSIS — I480 Paroxysmal atrial fibrillation: Secondary | ICD-10-CM | POA: Insufficient documentation

## 2015-01-29 DIAGNOSIS — Q231 Congenital insufficiency of aortic valve: Secondary | ICD-10-CM | POA: Diagnosis not present

## 2015-01-29 NOTE — Patient Instructions (Addendum)
Medication Instructions:    Labwork:  CBC BMET TODAY   Testing/Procedures:   Follow-Up:FOLLOW UP WITH AMBER IN 6 MONTHS CAN BE SCHEDULED NOW PER AMBER HER SCHEDULE OPEN   Any Other Special Instructions Will Be Listed Below (If Applicable).   OTHER BLOOD THINNERS   ELIQUIS   PRADAXA

## 2015-01-29 NOTE — Progress Notes (Signed)
Electrophysiology Office Note Date: 01/29/2015  ID:  Brett MaineJoe A Michael, DOB 26-Oct-1940, MRN [id number]  PCP: Lonzo CloudShamleffer, Landry MellowIbethal JARALLA, MD Primary Cardiologist: Hilty Electrophysiologist: Allred  CC: new AF detected on LINQ  Brett Michael is a 74 y.o. male is seen today for Dr Johney FrameAllred.  He presented in February of 2015 with vision changes and confusion. Imaging demonstrated embolic stroke.  Work up was negative and he underwent MDT LINQ implant during admission.  Recent remote interrogation demonstrated episodes of atrial fibrillation with RVR.  Dr Johney FrameAllred advised discontinuation of Plavix and initiation of Xarelto.  He presents today for further evaluation and follow up. Since last being seen in our clinic, the patient reports doing very well.  He denies chest pain, palpitations, PND, orthopnea, nausea, vomiting, dizziness, syncope.  He has chronic dyspnea and is on continuous home O2.   Past Medical History  Diagnosis Date  . Diabetes mellitus without complication   . COPD (chronic obstructive pulmonary disease)   . Hypertension   . Renal disorder     kidney stones  . CVA (cerebral infarction)   . Aortic stenosis     moderate by echo 10/2013   Past Surgical History  Procedure Laterality Date  . Tee without cardioversion N/A 10/23/2013    Procedure: TRANSESOPHAGEAL ECHOCARDIOGRAM (TEE);  Surgeon: Chrystie NoseKenneth C. Hilty, MD;  [facility];  Service: Cardiovascular;  Laterality: N/A;  . 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 albuterol (PROVENTIL HFA;VENTOLIN HFA) 108 (90 BASE) MCG/ACT inhaler Inhale 1 puff into the lungs every 6 (six) hours as needed for wheezing or shortness of breath. 1 Inhaler 3  . amLODipine (NORVASC) 10 MG tablet Take 10 mg by mouth daily.    Marland Kitchen. atorvastatin (LIPITOR) 20 MG tablet Take 0.5 tablets (10 mg total) by mouth daily at 6  PM. <PLEASE MAKE APPOINTMENT FOR REFILLS> 30 tablet 0  . beta carotene w/minerals (OCUVITE) tablet Take 1 tablet by mouth 2 (two) times daily.    . Fluticasone-Salmeterol (ADVAIR) 250-50 MCG/DOSE AEPB Inhale 1 puff into the lungs 2 (two) times daily.    . insulin glargine (LANTUS) 100 UNIT/ML injection Inject 12 Units into the skin at bedtime.    . metFORMIN (GLUCOPHAGE) 500 MG tablet Take 1 tablet (500 mg total) by mouth 2 (two) times daily with a meal. 60 tablet 0  . Multiple Vitamins-Minerals (MULTIVITAMIN PO) Take 1 tablet by mouth daily.    . OXYGEN-HELIUM IN Inhale 4 L into the lungs continuous.    . rivaroxaban (XARELTO) 20 MG TABS tablet Take 1 tablet (20 mg total) by mouth daily with supper. 30 tablet 11  . sertraline (ZOLOFT) 100 MG tablet Take 100 mg by mouth daily.    Marland Kitchen. Spacer/Aero-Holding Chambers (AEROCHAMBER MINI CHAMBER) DEVI Use as directed 1 Device 0  . tiotropium (SPIRIVA) 18 MCG inhalation capsule Place 18 mcg into inhaler and inhale daily.     No current facility-administered medications for this visit.    Allergies:   Symbicort   Social History: History   Social History  . Marital Status: Widowed    Spouse Name: N/A  . Number of Children: N/A  . Years of Education: N/A   Occupational History  . Not on file.   Social History Main Topics  . Smoking status: Former Smoker -- 1.00 packs/day for 30 years  Types: Cigarettes    Quit date: 09/14/2003  . Smokeless tobacco: Current User    Types: Chew  . Alcohol Use: No  . Drug Use: Not on file  . Sexual Activity: Not on file   Other Topics Concern  . Not on file   Social History Narrative    Family History: Family History  Problem Relation Age of Onset  . COPD Father   . COPD Brother     Review of Systems: All other systems reviewed and are otherwise negative except as noted above.   Physical Exam: VS:  BP 160/70 mmHg  Pulse 82  Ht 5\' 6"  (1.676 m)  Wt 202 lb (91.627 kg)  BMI 32.62 kg/m2 , BMI  Body mass index is 32.62 kg/(m^2). Wt Readings from Last 3 Encounters:  01/29/15 202 lb (91.627 kg)  12/06/13 194 lb 3.2 oz (88.089 kg)  10/31/13 194 lb (87.998 kg)    GEN- The patient is elderly and obese appearing, alert and oriented x 3 today.   HEENT: normocephalic, atraumatic; sclera clear, conjunctiva pink; hearing intact; oropharynx clear; neck supple, no JVP Lymph- no cervical lymphadenopathy Lungs- Clear to ausculation bilaterally, normal work of breathing Heart- Regular rate and rhythm, distant heart sounds, 2/6 SEM GI- soft, non-tender, non-distended, bowel sounds present, no hepatosplenomegaly Extremities- no clubbing, cyanosis, or edema; DP/PT/radial pulses 2+ bilaterally MS- no significant deformity or atrophy Skin- warm and dry, no rash or lesion  Psych- euthymic mood, full affect Neuro- strength and sensation are intact   EKG:  EKG is ordered today. The ekg ordered today shows sinus rhythm, rate 82, LAD, normal intervals  Recent Labs: No results found for requested labs within last 365 days.    Other studies Reviewed: Additional studies/ records that were reviewed today include:  Hospital records,Dr Hilty's note, recent echo  Assessment and Plan: 1. Parosyxmal atrial fibrillation The patient has newly diagnosed atrial fibrillation in the setting of prior cryptogenic stroke.  His Plavix has been discontinued and he has been started on Xarelto 20mg  daily.   CHADS2VASC score is at least 6 BMET, CBC today Ventricular rate during AF elevated, but with very short episodes, will not add rate control at this time.  Follow V rates via Carelink.   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  HTN Blood pressure elevated today HTN regimen recently adjusted by PCP Pt and daughter will follow at home and call back if persistently elevated.    Current medicines are reviewed at length with the patient today.     Xarelto is >$100/month.  I have given Xarelto rx assistance card today.  Will also give names of other NOAC's so they can evaluate if another would be cheaper. Offered Warfarin as an alternative, but they would prefer NOAC if able to afford.   Labs/ tests ordered today include:  Orders Placed This Encounter  Procedures  . Basic Metabolic Panel (BMET)  . CBC  . EKG 12-Lead     Disposition:   Follow up with me in 6 months to evaluate progression of AF burden.    Signed, Gypsy BalsamAmber Valaree Fresquez, NP 01/29/2015 4:05 PM   [facility] [address] [place] [address] [phone] (office) [phone] (fax)

## 2015-01-30 LAB — CBC
HCT: 39.2 % (ref 39.0–52.0)
HEMOGLOBIN: 13.3 g/dL (ref 13.0–17.0)
MCHC: 33.9 g/dL (ref 30.0–36.0)
MCV: 86.6 fl (ref 78.0–100.0)
PLATELETS: 165 10*3/uL (ref 150.0–400.0)
RBC: 4.52 Mil/uL (ref 4.22–5.81)
RDW: 15.4 % (ref 11.5–15.5)
WBC: 9.3 10*3/uL (ref 4.0–10.5)

## 2015-01-30 LAB — BASIC METABOLIC PANEL
BUN: 19 mg/dL (ref 6–23)
CO2: 30 mEq/L (ref 19–32)
CREATININE: 1.02 mg/dL (ref 0.40–1.50)
Calcium: 9.5 mg/dL (ref 8.4–10.5)
Chloride: 102 mEq/L (ref 96–112)
GFR: 75.4 mL/min (ref >60.00)
GLUCOSE: 126 mg/dL — AB (ref 70–99)
Potassium: 4.1 mEq/L (ref 3.5–5.1)
SODIUM: 139 meq/L (ref 135–145)

## 2015-02-11 LAB — CUP PACEART REMOTE DEVICE CHECK: MDC IDC SESS DTM: 20160531143038

## 2015-02-12 ENCOUNTER — Encounter: Payer: Self-pay | Admitting: Internal Medicine

## 2015-02-21 ENCOUNTER — Ambulatory Visit (INDEPENDENT_AMBULATORY_CARE_PROVIDER_SITE_OTHER): Payer: PPO | Admitting: *Deleted

## 2015-02-21 DIAGNOSIS — I6340 Cerebral infarction due to embolism of unspecified cerebral artery: Secondary | ICD-10-CM

## 2015-02-21 DIAGNOSIS — I639 Cerebral infarction, unspecified: Secondary | ICD-10-CM

## 2015-02-21 NOTE — Progress Notes (Signed)
Loop recorder 

## 2015-02-24 LAB — CUP PACEART REMOTE DEVICE CHECK: MDC IDC SESS DTM: 20160613155814

## 2015-02-26 ENCOUNTER — Encounter: Payer: Self-pay | Admitting: Internal Medicine

## 2015-03-11 ENCOUNTER — Encounter: Payer: Self-pay | Admitting: Internal Medicine

## 2015-03-18 ENCOUNTER — Other Ambulatory Visit: Payer: Self-pay | Admitting: *Deleted

## 2015-03-18 MED ORDER — ATORVASTATIN CALCIUM 20 MG PO TABS: 10.0000 mg | ORAL_TABLET | Freq: Every day | ORAL | Status: DC

## 2015-03-24 ENCOUNTER — Other Ambulatory Visit: Payer: Self-pay | Admitting: Internal Medicine

## 2015-03-24 ENCOUNTER — Ambulatory Visit (INDEPENDENT_AMBULATORY_CARE_PROVIDER_SITE_OTHER): Payer: PPO | Admitting: *Deleted

## 2015-03-24 DIAGNOSIS — I6340 Cerebral infarction due to embolism of unspecified cerebral artery: Secondary | ICD-10-CM | POA: Diagnosis not present

## 2015-03-24 DIAGNOSIS — I639 Cerebral infarction, unspecified: Secondary | ICD-10-CM

## 2015-03-24 LAB — CUP PACEART REMOTE DEVICE CHECK: Date Time Interrogation Session: 20160808143301

## 2015-03-25 NOTE — Progress Notes (Signed)
Loop recorder 

## 2015-04-22 ENCOUNTER — Ambulatory Visit (INDEPENDENT_AMBULATORY_CARE_PROVIDER_SITE_OTHER): Payer: PPO | Admitting: *Deleted

## 2015-04-22 DIAGNOSIS — I639 Cerebral infarction, unspecified: Secondary | ICD-10-CM

## 2015-04-22 DIAGNOSIS — I6340 Cerebral infarction due to embolism of unspecified cerebral artery: Secondary | ICD-10-CM | POA: Diagnosis not present

## 2015-04-22 LAB — CUP PACEART REMOTE DEVICE CHECK: Date Time Interrogation Session: 20160809123009

## 2015-04-22 NOTE — Progress Notes (Signed)
Loop recorder 

## 2015-04-30 ENCOUNTER — Encounter: Payer: Self-pay | Admitting: Internal Medicine

## 2015-05-01 ENCOUNTER — Encounter: Payer: Self-pay | Admitting: Internal Medicine

## 2015-05-22 ENCOUNTER — Ambulatory Visit (INDEPENDENT_AMBULATORY_CARE_PROVIDER_SITE_OTHER): Payer: PPO | Admitting: *Deleted

## 2015-05-22 DIAGNOSIS — I6340 Cerebral infarction due to embolism of unspecified cerebral artery: Secondary | ICD-10-CM | POA: Diagnosis not present

## 2015-05-22 DIAGNOSIS — I639 Cerebral infarction, unspecified: Secondary | ICD-10-CM

## 2015-05-22 NOTE — Progress Notes (Signed)
Loop recorder 

## 2015-05-29 ENCOUNTER — Encounter: Payer: Self-pay | Admitting: Internal Medicine

## 2015-06-02 ENCOUNTER — Ambulatory Visit (INDEPENDENT_AMBULATORY_CARE_PROVIDER_SITE_OTHER)
Admission: RE | Admit: 2015-06-02 | Discharge: 2015-06-02 | Disposition: A | Discharge status: Home / Self Care | Payer: PPO | Source: Ambulatory Visit | Attending: Pulmonary Disease | Admitting: Pulmonary Disease

## 2015-06-02 ENCOUNTER — Other Ambulatory Visit (INDEPENDENT_AMBULATORY_CARE_PROVIDER_SITE_OTHER): Payer: PPO

## 2015-06-02 ENCOUNTER — Encounter: Payer: Self-pay | Admitting: Pulmonary Disease

## 2015-06-02 ENCOUNTER — Ambulatory Visit (INDEPENDENT_AMBULATORY_CARE_PROVIDER_SITE_OTHER): Payer: PPO | Admitting: Pulmonary Disease

## 2015-06-02 VITALS — BP 138/76 | HR 81 | Temp 98.4°F | Ht 66.0 in | Wt 203.0 lb

## 2015-06-02 DIAGNOSIS — R0602 Shortness of breath: Secondary | ICD-10-CM

## 2015-06-02 DIAGNOSIS — J9611 Chronic respiratory failure with hypoxia: Secondary | ICD-10-CM | POA: Diagnosis not present

## 2015-06-02 DIAGNOSIS — J449 Chronic obstructive pulmonary disease, unspecified: Secondary | ICD-10-CM

## 2015-06-02 DIAGNOSIS — R0600 Dyspnea, unspecified: Secondary | ICD-10-CM | POA: Diagnosis not present

## 2015-06-02 LAB — CBC WITH DIFFERENTIAL/PLATELET
BASOS ABS: 0 10*3/uL (ref 0.0–0.1)
BASOS PCT: 0.2 % (ref 0.0–3.0)
EOS ABS: 0.1 10*3/uL (ref 0.0–0.7)
Eosinophils Relative: 1.3 % (ref 0.0–5.0)
HEMATOCRIT: 40.2 % (ref 39.0–52.0)
HEMOGLOBIN: 13.5 g/dL (ref 13.0–17.0)
LYMPHS PCT: 13.8 % (ref 12.0–46.0)
Lymphs Abs: 1.3 10*3/uL (ref 0.7–4.0)
MCHC: 33.5 g/dL (ref 30.0–36.0)
MCV: 87.7 fl (ref 78.0–100.0)
MONO ABS: 0.5 10*3/uL (ref 0.1–1.0)
Monocytes Relative: 5.6 % (ref 3.0–12.0)
Neutro Abs: 7.6 10*3/uL (ref 1.4–7.7)
Neutrophils Relative %: 79.1 % — ABNORMAL HIGH (ref 43.0–77.0)
Platelets: 159 10*3/uL (ref 150.0–400.0)
RBC: 4.58 Mil/uL (ref 4.22–5.81)
RDW: 15.8 % — ABNORMAL HIGH (ref 11.5–15.5)
WBC: 9.6 10*3/uL (ref 4.0–10.5)

## 2015-06-02 NOTE — Patient Instructions (Signed)
We are going to order blood work, a chest x-ray, and a lung function test to try to get to the bottom of why her short of breath Keep using her oxygen as you're doing We will see you back in 1-2 weeks to go over the results or sooner if needed

## 2015-06-02 NOTE — Assessment & Plan Note (Signed)
Today in the office he needed 6 L continuous  to maintain his O2 saturation greater than 88% on exertion.  Plan: Change prescription to reflect 4 L of O2 at rest, 6 L continuous Oxygen dependent (Oxymizer) disease prescribed today

## 2015-06-02 NOTE — Assessment & Plan Note (Signed)
He is coming in today complaining of worsening dyspnea over the last several months. On exam he has a few crackles in the bases of his lungs. We do not have copies of his prior pulmonary function testing but based on the fact that he uses 4 L of oxygen by suppose he has severe COPD. I explained to him that the differential diagnosis of dyspnea is broad and includes heart disease, neuromuscular weakness, lung disease, and anemia. On exam his cardiovascular exam seems within normal limits, though he does have a few crackles in the bases of his lungs. He does not have evidence of neuromuscular weakness on exam.  Plan: Chest x-ray Point retention testing Obtain results of prior positive function testing CBC Basically about panel BNP Follow-up one week to go over these results

## 2015-06-02 NOTE — Progress Notes (Signed)
Subjective:    Patient ID: Brett Michael, male    DOB: 1940/12/14, 74 y.o.   MRN: [id number]  HPI  Chief Complaint  Patient presents with  . Acute Visit    RB pt only seen on 10/31/13- c/o increased DOE, prod cough with white mucus X2 wks.   Denies chest pain, fever, sinus congestion.       My breathin gis getting worse.  He says that he gained 4-5 pounds recently but he's not sure if the two are related. He has been breahting more for the last two months.  He recently added a 50 foot cord.  Around that time he had more dyspnea.  If he gets up and goes to the bathroom then he is "really winded".   He had stem cell therapy at the [facility] in [place], [place].  He is coughing form time to time, he coughs up an creamy oyster from time to time. He is taking Advair, Spiriva and Albuterol. He doesn't feel like thye helphing Just minimal activity such as takinga shower makes him dyspneic. No chest pain, no leg swelling.  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      Past Medical History  Diagnosis Date  . Diabetes mellitus without complication   . COPD (chronic obstructive pulmonary disease)   . Hypertension   . Renal disorder     kidney stones  . CVA (cerebral infarction)   . Aortic stenosis     moderate by echo 10/2013     Review of Systems  Constitutional: Positive for fatigue. Negative for fever and diaphoresis.  HENT: Negative for nosebleeds, postnasal drip and rhinorrhea.   Respiratory: Positive for cough and shortness of breath. Negative for wheezing.   Cardiovascular: Negative for chest pain, palpitations and leg swelling.       Objective:   Physical Exam  Filed Vitals:   06/02/15 1543  BP: 138/76  Pulse: 81  Temp: 98.4 F (36.9 C)  TempSrc: Oral  Height:  (1.676 m)  Weight: 203 lb (92.08 kg)  SpO2: 91%  4L [place]  Needed 6 L O2 on exertion to maintain O2 saturation > 88%  Gen: chronically ill appearing HENT: OP clear, neck supple PULM: Crackles bases,  normal percussion CV: RRR, no mgr, trace edema GI: BS+, soft, nontender Derm: no cyanosis or rash Psyche: normal mood and affect  Record from my partner were reviewed      Assessment & Plan:  Dyspnea He is coming in today complaining of worsening dyspnea over the last several months. On exam he has a few crackles in the bases of his lungs. We do not have copies of his prior pulmonary function testing but based on the fact that he uses 4 L of oxygen by suppose he has severe COPD. I explained to him that the differential diagnosis of dyspnea is broad and includes heart disease, neuromuscular weakness, lung disease, and anemia. On exam his cardiovascular exam seems within normal limits, though he does have a few crackles in the bases of his lungs. He does not have evidence of neuromuscular weakness on exam.  Plan: Chest x-ray Point retention testing Obtain results of prior positive function testing CBC Basically about panel BNP Follow-up one week to go over these results  Chronic respiratory failure with hypoxia Today in the office he needed 6 L continuous  to maintain his O2 saturation greater than 88% on exertion.  Plan: Change prescription to reflect 4 L of O2 at rest,  6 L continuous Oxygen dependent (Oxymizer) disease prescribed today    Current outpatient prescriptions:  .  albuterol (PROVENTIL HFA;VENTOLIN HFA) 108 (90 BASE) MCG/ACT inhaler, Inhale 1 puff into the lungs every 6 (six) hours as needed for wheezing or shortness of breath., Disp: 1 Inhaler, Rfl: 3 .  amLODipine (NORVASC) 10 MG tablet, Take 10 mg by mouth daily., Disp: , Rfl:  .  atorvastatin (LIPITOR) 20 MG tablet, Take 0.5 tablets (10 mg total) by mouth daily at 6 PM. KEEP OV., Disp: 30 tablet, Rfl: 3 .  beta carotene w/minerals (OCUVITE) tablet, Take 1 tablet by mouth 2 (two) times daily., Disp: , Rfl:  .  Fluticasone-Salmeterol (ADVAIR) 250-50 MCG/DOSE AEPB, Inhale 1 puff into the lungs 2 (two) times daily.,  Disp: , Rfl:  .  insulin glargine (LANTUS) 100 UNIT/ML injection, Inject 12 Units into the skin at bedtime., Disp: , Rfl:  .  metFORMIN (GLUCOPHAGE) 500 MG tablet, Take 1 tablet (500 mg total) by mouth 2 (two) times daily with a meal., Disp: 60 tablet, Rfl: 0 .  Multiple Vitamins-Minerals (MULTIVITAMIN PO), Take 1 tablet by mouth daily., Disp: , Rfl:  .  OXYGEN-HELIUM IN, Inhale 4 L into the lungs continuous., Disp: , Rfl:  .  rivaroxaban (XARELTO) 20 MG TABS tablet, Take 1 tablet (20 mg total) by mouth daily with supper., Disp: 30 tablet, Rfl: 11 .  sertraline (ZOLOFT) 100 MG tablet, Take 100 mg by mouth daily., Disp: , Rfl:  .  Spacer/Aero-Holding Chambers (AEROCHAMBER MINI CHAMBER) DEVI, Use as directed, Disp: 1 Device, Rfl: 0 .  tiotropium (SPIRIVA) 18 MCG inhalation capsule, Place 18 mcg into inhaler and inhale daily., Disp: , Rfl:

## 2015-06-03 LAB — BASIC METABOLIC PANEL
BUN: 20 mg/dL (ref 6–23)
CHLORIDE: 102 meq/L (ref 96–112)
CO2: 29 meq/L (ref 19–32)
Calcium: 9.4 mg/dL (ref 8.4–10.5)
Creatinine, Ser: 1.05 mg/dL (ref 0.40–1.50)
GFR: 72.86 mL/min (ref >60.00)
GLUCOSE: 145 mg/dL — AB (ref 70–99)
POTASSIUM: 4.5 meq/L (ref 3.5–5.1)
SODIUM: 140 meq/L (ref 135–145)

## 2015-06-06 ENCOUNTER — Ambulatory Visit (HOSPITAL_COMMUNITY)
Admission: RE | Admit: 2015-06-06 | Discharge: 2015-06-06 | Disposition: A | Discharge status: Home / Self Care | Payer: PPO | Source: Ambulatory Visit | Attending: Pulmonary Disease | Admitting: Pulmonary Disease

## 2015-06-06 DIAGNOSIS — J449 Chronic obstructive pulmonary disease, unspecified: Secondary | ICD-10-CM

## 2015-06-06 DIAGNOSIS — R0602 Shortness of breath: Secondary | ICD-10-CM | POA: Insufficient documentation

## 2015-06-06 LAB — PULMONARY FUNCTION TEST
DL/VA % pred: 44 %
DL/VA: 1.86 ml/min/mmHg/L
DLCO unc % pred: 29 %
DLCO unc: 7.28 ml/min/mmHg
FEF 25-75 Post: 0.52 L/sec
FEF 25-75 Pre: 0.33 L/sec
FEF2575-%CHANGE-POST: 56 %
FEF2575-%PRED-PRE: 20 %
FEF2575-%Pred-Post: 32 %
FEV1-%Change-Post: 23 %
FEV1-%PRED-POST: 56 %
FEV1-%PRED-PRE: 45 %
FEV1-POST: 1.28 L
FEV1-PRE: 1.04 L
FEV1FVC-%Change-Post: 13 %
FEV1FVC-%Pred-Pre: 60 %
FEV6-%Change-Post: 13 %
FEV6-%PRED-PRE: 70 %
FEV6-%Pred-Post: 80 %
FEV6-POST: 2.37 L
FEV6-PRE: 2.09 L
FEV6FVC-%Change-Post: 4 %
FEV6FVC-%PRED-POST: 99 %
FEV6FVC-%PRED-PRE: 94 %
FVC-%CHANGE-POST: 8 %
FVC-%PRED-PRE: 74 %
FVC-%Pred-Post: 80 %
FVC-POST: 2.57 L
FVC-PRE: 2.38 L
POST FEV6/FVC RATIO: 92 %
PRE FEV1/FVC RATIO: 44 %
Post FEV1/FVC ratio: 50 %
Pre FEV6/FVC Ratio: 88 %
RV % PRED: 77 %
RV: 1.74 L
TLC % PRED: 74 %
TLC: 4.36 L

## 2015-06-06 MED ORDER — ALBUTEROL SULFATE (2.5 MG/3ML) 0.083% IN NEBU
2.5000 mg | INHALATION_SOLUTION | Freq: Once | RESPIRATORY_TRACT | Status: AC
  Administered 2015-06-06: 2.5 mg via RESPIRATORY_TRACT

## 2015-06-16 ENCOUNTER — Ambulatory Visit: Payer: PPO | Admitting: Adult Health

## 2015-06-20 LAB — CUP PACEART REMOTE DEVICE CHECK: Date Time Interrogation Session: 20160908063714

## 2015-06-20 NOTE — Progress Notes (Signed)
Carelink summary report received. Battery status OK. Normal device function. No new symptom episodes, tachy episodes, brady, or pause episodes. 1 AF episode, 0.0% + xarelto. Monthly summary reports and ROV w/ AS 08/04/15.

## 2015-06-23 ENCOUNTER — Ambulatory Visit (INDEPENDENT_AMBULATORY_CARE_PROVIDER_SITE_OTHER): Payer: PPO | Admitting: *Deleted

## 2015-06-23 DIAGNOSIS — I639 Cerebral infarction, unspecified: Secondary | ICD-10-CM

## 2015-06-24 ENCOUNTER — Encounter: Payer: Self-pay | Admitting: Adult Health

## 2015-06-24 ENCOUNTER — Ambulatory Visit (INDEPENDENT_AMBULATORY_CARE_PROVIDER_SITE_OTHER): Payer: PPO | Admitting: Adult Health

## 2015-06-24 VITALS — BP 130/72 | HR 82 | Temp 97.9°F | Ht 66.0 in | Wt 206.0 lb

## 2015-06-24 DIAGNOSIS — J9611 Chronic respiratory failure with hypoxia: Secondary | ICD-10-CM

## 2015-06-24 DIAGNOSIS — J449 Chronic obstructive pulmonary disease, unspecified: Secondary | ICD-10-CM

## 2015-06-24 DIAGNOSIS — Z23 Encounter for immunization: Secondary | ICD-10-CM | POA: Diagnosis not present

## 2015-06-24 NOTE — Assessment & Plan Note (Addendum)
Compensated on O2  Does have a small PFO on echo along with severe COPD /emphysema.   plan Cont on O2 at 4l/m rest and 6 with act  POC order sent to DME

## 2015-06-24 NOTE — Assessment & Plan Note (Addendum)
Severe COPD with reversible component   CXR does show BB atx , discussed further imaging with CT chest to look for ILD changes , however he has been on O2 for several years . Would be high risk for open lung bx if present. He does not wish to proceed w/ CT chest .   plan  Flu  shot today  Cont on Spiriva and advair .

## 2015-06-24 NOTE — Progress Notes (Signed)
Subjective:    Patient ID: Brett Michael, male    DOB: 1941-08-15, 74 y.o.   MRN: [id number]  HPI 74 yo male former smoker with severe COPD on O2   TEST  PFT on 06/06/15 >FEV1 45%, ratio 44%, +BD response (post FEV1 56%) , FVC 74%. ,DLCO 29%   06/24/2015 Follow up : COPD and Chronic RF on O2  Pt returns for 1 month. He is followed by Dr. Delton Coombes  However has only seen him once in office in early 2015 for initial visit. He did not return for follow up . He is from [place] and previously received his medical care in [place]. He has been on Oxygen for few years . He remains on Advair and Spiriva .  Seen last month for progressive DOE. No flare of cough or wheezing  On oxygen 4l/m rest and 6 l/m with activity . Says he gets winded with minimal activity walking. At rest on O2 does not have dyspnea .  Would like order for POC since o2 tanks wear out quickly.  CXR last ov BB atx . He is accompanied by his daughter. We talked about a CT chest to look closer at lungs for any possible ILD changes, however he declines at this time.  We discussed his test results. He denies flare of cough or wheezng  No discolored mucus or fever.  We discussed pulmonary rehab, he was not interested.  Needs flu shot .  Says he did work around a lot of asbestosis or oil fumes.   Followed by cards for bicuspid aortic valve with PFO on echo , on xarelto w/ previous CVA and atrial fib.    Review of Systems Constitutional:   No  weight loss, night sweats,  Fevers, chills, + fatigue, or  lassitude.  HEENT:   No headaches,  Difficulty swallowing,  Tooth/dental problems, or  Sore throat,                No sneezing, itching, ear ache, nasal congestion, post nasal drip,   CV:  No chest pain,  Orthopnea, PND, swelling in lower extremities, anasarca, dizziness, palpitations, syncope.   GI  No heartburn, indigestion, abdominal pain, nausea, vomiting, diarrhea, change in bowel habits, loss of appetite, bloody stools.   Resp:     No chest wall deformity  Skin: no rash or lesions.  GU: no dysuria, change in color of urine, no urgency or frequency.  No flank pain, no hematuria   MS:  No joint pain or swelling.  No decreased range of motion.  No back pain.  Psych:  No change in mood or affect. No depression or anxiety.  No memory loss.         Objective:   Physical Exam GEN: A/Ox3; pleasant , NAD, obese , chronically ill appearing on O2   VS reviewed   HEENT:  [place]/AT,  EACs-clear, TMs-wnl, NOSE-clear, THROAT-clear, no lesions, no postnasal drip or exudate noted.   NECK:  Supple w/ fair ROM; no JVD; normal carotid impulses w/o bruits; no thyromegaly or nodules palpated; no lymphadenopathy.  RESP  Decreased BS in bases, no accessory muscle use, no dullness to percussion  CARD:  RRR, no m/r/g  , no peripheral edema, pulses intact, no cyanosis or clubbing.  GI:   Soft & nt; nml bowel sounds; no organomegaly or masses detected.  Musco: Warm bil, no deformities or joint swelling noted.   Neuro: alert, no focal deficits noted.    Skin:  Warm, no lesions or rashes   CXR 06/03/15 BB atx      Assessment & Plan:

## 2015-06-24 NOTE — Patient Instructions (Addendum)
Continue on current regimen  Flu shot today .  Continue on Oxygen 4l/m rest and 6l/m activity  Order sent to [facility] for evaluation for portable concentrator .  follow up Dr. Delton Coombes  In 2 months and As needed

## 2015-06-24 NOTE — Progress Notes (Signed)
LOOP RECORDER  

## 2015-07-01 ENCOUNTER — Telehealth: Payer: Self-pay | Admitting: Adult Health

## 2015-07-01 NOTE — Telephone Encounter (Signed)
[place] [facility]. She reports with pt being on 6 liters w/ exertion, the POC would not be enough for him since this is pulse dose. Per Angelica ChessmanMandy, if we change him over to liquid O2, this would last longer for pt since he is on cont O2.  Please advise TP if okay to order? thanks

## 2015-07-02 NOTE — Telephone Encounter (Signed)
That is fine if pt is okay with that  Did they try the POC =I thought they came in 6 l pulse.  He prob will drop on pulse dose but was not sure if they tried him or not.  See if they are ok with liquid O2

## 2015-07-02 NOTE — Telephone Encounter (Signed)
Spoke with Angelica ChessmanMandy, states that pt has not yet been titrated on pulse 02, their respiratory therapist wanted to clarify this before doing the walk as pt is on 6l continuous per our records.  mandy states the pt will have a titration walk to see if pulse 02 on poc will work for pt.  Nothing further needed at this time.

## 2015-07-04 ENCOUNTER — Encounter: Payer: Self-pay | Admitting: Internal Medicine

## 2015-07-07 ENCOUNTER — Telehealth: Payer: Self-pay | Admitting: Adult Health

## 2015-07-07 NOTE — Telephone Encounter (Signed)
Will forward to TP as an FYI 

## 2015-07-07 NOTE — Telephone Encounter (Signed)
Okay, thanks for trying  Do they have an alternative that is not as heavy.?

## 2015-07-07 NOTE — Telephone Encounter (Signed)
lmtcb X[facility] to see what other options are available.   Per last telephone note it looks like liquid 02 was [facility].

## 2015-07-07 NOTE — Telephone Encounter (Signed)
Mandy with [facility] called back, states that d/t his high liter flow he is very limited as to lighter options.  Mandy and her manager are going to discuss mobility options with their respiratory team to see what would be easier for the pt.  Tourist information centre managerMandy and manager say that liquid 02 is not feasable for pt as his tanks would freeze at such a high liter flow.    Will await call back.

## 2015-07-09 NOTE — Telephone Encounter (Signed)
lmtcb x1 for Brett Michael. 

## 2015-07-10 NOTE — Telephone Encounter (Signed)
Per Angelica Chessmanmandy, pt is on scheduled for the therapists to see him mandy os going to call back

## 2015-07-11 LAB — CUP PACEART REMOTE DEVICE CHECK: MDC IDC SESS DTM: 20161008063533

## 2015-07-11 NOTE — Progress Notes (Signed)
Carelink summary report received. Battery status OK. Normal device function. No new symptom episodes, tachy episodes, brady, or pause episodes. 1 AF episode (burden 0.0%), ?SR w/PVC, +Xarelto, avg V rate controlled. Monthly summary reports and ROV with AS on 08/04/15 at 11:10am.

## 2015-07-21 ENCOUNTER — Ambulatory Visit (INDEPENDENT_AMBULATORY_CARE_PROVIDER_SITE_OTHER): Payer: PPO | Admitting: *Deleted

## 2015-07-21 DIAGNOSIS — I639 Cerebral infarction, unspecified: Secondary | ICD-10-CM

## 2015-07-21 NOTE — Progress Notes (Signed)
Carelink Summary Report / Loop recorder 

## 2015-07-24 ENCOUNTER — Other Ambulatory Visit: Payer: Self-pay | Admitting: Internal Medicine

## 2015-07-24 ENCOUNTER — Encounter: Payer: Self-pay | Admitting: Internal Medicine

## 2015-07-24 DIAGNOSIS — R140 Abdominal distension (gaseous): Secondary | ICD-10-CM

## 2015-07-30 ENCOUNTER — Ambulatory Visit
Admission: RE | Admit: 2015-07-30 | Discharge: 2015-07-30 | Disposition: A | Discharge status: Home / Self Care | Payer: PPO | Source: Ambulatory Visit | Attending: Internal Medicine | Admitting: Internal Medicine

## 2015-07-30 DIAGNOSIS — R140 Abdominal distension (gaseous): Secondary | ICD-10-CM

## 2015-07-31 ENCOUNTER — Other Ambulatory Visit: Payer: Self-pay | Admitting: Internal Medicine

## 2015-07-31 DIAGNOSIS — N1339 Other hydronephrosis: Secondary | ICD-10-CM

## 2015-08-04 ENCOUNTER — Encounter: Payer: Self-pay | Admitting: Internal Medicine

## 2015-08-04 ENCOUNTER — Ambulatory Visit (INDEPENDENT_AMBULATORY_CARE_PROVIDER_SITE_OTHER): Payer: PPO | Admitting: Nurse Practitioner

## 2015-08-04 ENCOUNTER — Encounter: Payer: Self-pay | Admitting: Nurse Practitioner

## 2015-08-04 VITALS — BP 136/72 | HR 76 | Ht 66.0 in | Wt 204.2 lb

## 2015-08-04 DIAGNOSIS — I480 Paroxysmal atrial fibrillation: Secondary | ICD-10-CM | POA: Diagnosis not present

## 2015-08-04 DIAGNOSIS — I10 Essential (primary) hypertension: Secondary | ICD-10-CM | POA: Diagnosis not present

## 2015-08-04 NOTE — Patient Instructions (Signed)
Medication Instructions:   Your physician recommends that you continue on your current medications as directed. Please refer to the Current Medication list given to you today.    If you need a refill on your cardiac medications before your next appointment, please call your pharmacy.  Labwork: NONE ORDER TODAY    Testing/Procedures: NONE ORDER TODAY    Follow-Up: WITH DR HILTY  AS SCHEDULED  IN RECALL LETTERS  Any Other Special Instructions Will Be Listed Below (If Applicable).

## 2015-08-04 NOTE — Progress Notes (Signed)
Electrophysiology Office Note Date: 08/04/2015  ID:  WESTON KALLMAN, DOB 11/27/1940, MRN [id number]  PCP: Lonzo Cloud Landry Mellow, MD Primary Cardiologist: Hilty Electrophysiologist: Allred  CC: AF follow up  Brett Michael is a 74 y.o. male is seen today for Dr Johney Frame.  He presented in February of 2015 with vision changes and confusion. Imaging demonstrated embolic stroke.  Work up was negative and he underwent MDT LINQ implant during admission.  Device interrogation demonstrated episodes of atrial fibrillation with RVR; his Plavix was discontinued and he was started on Xarelto. He presents today for follow up of atrial fibrillation. Since last being seen in our clinic, the patient reports doing very well.  He denies chest pain, palpitations, PND, orthopnea, nausea, vomiting, dizziness, syncope.  He has chronic dyspnea and is on continuous home O2.   Past Medical History  Diagnosis Date  . Diabetes mellitus without complication (HCC)   . COPD (chronic obstructive pulmonary disease) (HCC)   . Hypertension   . Renal disorder     kidney stones  . CVA (cerebral infarction)   . Aortic stenosis     moderate by echo 10/2013  . Paroxysmal atrial fibrillation [facility])    Past Surgical History  Procedure Laterality Date  . Tee without cardioversion N/A 10/23/2013    Procedure: TRANSESOPHAGEAL ECHOCARDIOGRAM (TEE);  Surgeon: [name], MD;  Location: [facility] ENDOSCOPY;  Service: Cardiovascular;  Laterality: N/A;  . 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 albuterol (PROVENTIL HFA;VENTOLIN HFA) 108 (90 BASE) MCG/ACT inhaler Inhale 1 puff into the lungs every 6 (six) hours as needed for wheezing or shortness of breath. 1 Inhaler 3  . amLODipine (NORVASC) 10 MG tablet Take 10 mg by mouth daily.    Marland Kitchen atorvastatin (LIPITOR) 20 MG tablet Take 0.5 tablets (10 mg  total) by mouth daily at 6 PM. KEEP OV. 30 tablet 3  . beta carotene w/minerals (OCUVITE) tablet Take 1 tablet by mouth 2 (two) times daily.    . canagliflozin (INVOKANA) 300 MG TABS tablet Take 300 mg by mouth daily before breakfast.    . Fluticasone-Salmeterol (ADVAIR) 250-50 MCG/DOSE AEPB Inhale 1 puff into the lungs 2 (two) times daily.    . insulin glargine (LANTUS) 100 UNIT/ML injection Inject 16 Units into the skin at bedtime.     Marland Kitchen losartan (COZAAR) 100 MG tablet Take 100 mg by mouth daily.    . metFORMIN (GLUCOPHAGE) 500 MG tablet Take 1 tablet (500 mg total) by mouth 2 (two) times daily with a meal. 60 tablet 0  . Multiple Vitamins-Minerals (MULTIVITAMIN PO) Take 1 tablet by mouth daily.    . OXYGEN-HELIUM IN Inhale 6 L into the lungs continuous.     . rivaroxaban (XARELTO) 20 MG TABS tablet Take 1 tablet (20 mg total) by mouth daily with supper. 30 tablet 11  . sertraline (ZOLOFT) 100 MG tablet Take 100 mg by mouth daily.    . sitaGLIPtin (JANUVIA) 100 MG tablet Take 100 mg by mouth daily.    Marland Kitchen Spacer/Aero-Holding Chambers (AEROCHAMBER MINI CHAMBER) DEVI Use as directed 1 Device 0  . tiotropium (SPIRIVA) 18 MCG inhalation capsule Place 18 mcg into inhaler and inhale daily.     No current facility-administered medications for this visit.    Allergies:   Symbicort   Social History: Social History   Social History  .  Marital Status: Widowed    Spouse Name: N/A  . Number of Children: N/A  . Years of Education: N/A   Occupational History  . Not on file.   Social History Main Topics  . Smoking status: Former Smoker -- 1.00 packs/day for 30 years    Types: Cigarettes    Quit date: 09/14/2003  . Smokeless tobacco: Current User    Types: Chew  . Alcohol Use: No  . Drug Use: Not on file  . Sexual Activity: Not on file   Other Topics Concern  . Not on file   Social History Narrative    Family History: Family History  Problem Relation Age of Onset  . COPD Father   .  COPD Brother     Review of Systems: All other systems reviewed and are otherwise negative except as noted above.   Physical Exam: VS:  BP 136/72 mmHg  Pulse 76  Ht 5\' 6"  (1.676 m)  Wt 204 lb 3.2 oz (92.625 kg)  BMI 32.97 kg/m2  SpO2 95% , BMI Body mass index is 32.97 kg/(m^2). Wt Readings from Last 3 Encounters:  08/04/15 204 lb 3.2 oz (92.625 kg)  06/24/15 206 lb (93.441 kg)  06/02/15 203 lb (92.08 kg)    GEN- The patient is elderly and obese appearing, alert and oriented x 3 today.   HEENT: normocephalic, atraumatic; sclera clear, conjunctiva pink; hearing intact; oropharynx clear; neck supple Lungs- Clear to ausculation bilaterally, normal work of breathing Heart- Regular rate and rhythm, distant heart sounds, 2/6 SEM GI- soft, non-tender, non-distended, bowel sounds present Extremities- no clubbing, cyanosis, or edema; DP/PT/radial pulses 2+ bilaterally MS- no significant deformity or atrophy Skin- warm and dry, no rash or lesion  Psych- euthymic mood, full affect Neuro- strength and sensation are intact   EKG:  EKG is not ordered today.  Recent Labs: 06/02/2015: BUN 20; Creatinine, Ser 1.05; Hemoglobin 13.5; Platelets 159.0; Potassium 4.5; Sodium 140    Assessment and Plan: 1. 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  HTN Stable No change required today  Current medicines are reviewed at length with the patient today.   No changes made today  Labs/ tests ordered today include: none   Disposition:   Follow up with Dr Rennis GoldenHilty as scheduled. EP to see as needed, we will continue to monitor AF burden remotely and information will be available in EPIC  Signed, Gypsy BalsamAmber Anakin Varkey, NP 08/04/2015 12:06 PM   [facility] [address] [place] [address] [phone] (office) [phone] (fax)

## 2015-08-12 ENCOUNTER — Ambulatory Visit
Admission: RE | Admit: 2015-08-12 | Discharge: 2015-08-12 | Disposition: A | Discharge status: Home / Self Care | Payer: PPO | Source: Ambulatory Visit | Attending: Internal Medicine | Admitting: Internal Medicine

## 2015-08-12 DIAGNOSIS — N1339 Other hydronephrosis: Secondary | ICD-10-CM

## 2015-08-12 MED ORDER — IOPAMIDOL (ISOVUE-300) INJECTION 61%
125.0000 mL | Freq: Once | INTRAVENOUS | Status: AC | PRN
  Administered 2015-08-12: 125 mL via INTRAVENOUS

## 2015-08-15 ENCOUNTER — Ambulatory Visit: Payer: PPO | Admitting: Emergency Medicine

## 2015-08-17 LAB — CUP PACEART REMOTE DEVICE CHECK: Date Time Interrogation Session: 20161107070620

## 2015-08-17 NOTE — Progress Notes (Signed)
Carelink summary report received. Battery status OK. Normal device function. No new symptom episodes, tachy episodes, brady, or pause episodes. No new AF episodes, +Xarelto. Monthly summary reports and ROV with JA PRN. 

## 2015-08-20 ENCOUNTER — Ambulatory Visit (INDEPENDENT_AMBULATORY_CARE_PROVIDER_SITE_OTHER): Payer: PPO | Admitting: *Deleted

## 2015-08-20 DIAGNOSIS — I639 Cerebral infarction, unspecified: Secondary | ICD-10-CM | POA: Diagnosis not present

## 2015-08-20 NOTE — Progress Notes (Signed)
Carelink Summary Report / Loop Recorder 

## 2015-09-03 ENCOUNTER — Encounter (HOSPITAL_COMMUNITY): Payer: Self-pay | Admitting: *Deleted

## 2015-09-03 ENCOUNTER — Inpatient Hospital Stay (HOSPITAL_COMMUNITY)
Admission: EM | Admit: 2015-09-03 | Discharge: 2015-09-08 | DRG: 190 | Disposition: A | Discharge status: Home / Self Care | Payer: PPO | Attending: Internal Medicine | Admitting: Internal Medicine

## 2015-09-03 ENCOUNTER — Emergency Department (HOSPITAL_COMMUNITY): Payer: PPO

## 2015-09-03 DIAGNOSIS — Z794 Long term (current) use of insulin: Secondary | ICD-10-CM

## 2015-09-03 DIAGNOSIS — Q231 Congenital insufficiency of aortic valve: Secondary | ICD-10-CM | POA: Diagnosis not present

## 2015-09-03 DIAGNOSIS — E785 Hyperlipidemia, unspecified: Secondary | ICD-10-CM | POA: Diagnosis present

## 2015-09-03 DIAGNOSIS — F32A Depression, unspecified: Secondary | ICD-10-CM

## 2015-09-03 DIAGNOSIS — J9621 Acute and chronic respiratory failure with hypoxia: Secondary | ICD-10-CM | POA: Diagnosis present

## 2015-09-03 DIAGNOSIS — J449 Chronic obstructive pulmonary disease, unspecified: Secondary | ICD-10-CM | POA: Diagnosis present

## 2015-09-03 DIAGNOSIS — J441 Chronic obstructive pulmonary disease with (acute) exacerbation: Principal | ICD-10-CM | POA: Diagnosis present

## 2015-09-03 DIAGNOSIS — J440 Chronic obstructive pulmonary disease with acute lower respiratory infection: Secondary | ICD-10-CM | POA: Diagnosis present

## 2015-09-03 DIAGNOSIS — Z8673 Personal history of transient ischemic attack (TIA), and cerebral infarction without residual deficits: Secondary | ICD-10-CM | POA: Diagnosis not present

## 2015-09-03 DIAGNOSIS — R0602 Shortness of breath: Secondary | ICD-10-CM

## 2015-09-03 DIAGNOSIS — D696 Thrombocytopenia, unspecified: Secondary | ICD-10-CM | POA: Diagnosis present

## 2015-09-03 DIAGNOSIS — I10 Essential (primary) hypertension: Secondary | ICD-10-CM | POA: Diagnosis present

## 2015-09-03 DIAGNOSIS — F329 Major depressive disorder, single episode, unspecified: Secondary | ICD-10-CM | POA: Diagnosis present

## 2015-09-03 DIAGNOSIS — J14 Pneumonia due to Hemophilus influenzae: Secondary | ICD-10-CM | POA: Diagnosis present

## 2015-09-03 DIAGNOSIS — Z9981 Dependence on supplemental oxygen: Secondary | ICD-10-CM

## 2015-09-03 DIAGNOSIS — J189 Pneumonia, unspecified organism: Secondary | ICD-10-CM | POA: Diagnosis not present

## 2015-09-03 DIAGNOSIS — E1165 Type 2 diabetes mellitus with hyperglycemia: Secondary | ICD-10-CM | POA: Diagnosis present

## 2015-09-03 DIAGNOSIS — I480 Paroxysmal atrial fibrillation: Secondary | ICD-10-CM | POA: Diagnosis present

## 2015-09-03 DIAGNOSIS — Z87891 Personal history of nicotine dependence: Secondary | ICD-10-CM | POA: Diagnosis not present

## 2015-09-03 DIAGNOSIS — Z7901 Long term (current) use of anticoagulants: Secondary | ICD-10-CM | POA: Diagnosis not present

## 2015-09-03 DIAGNOSIS — Q230 Congenital stenosis of aortic valve: Secondary | ICD-10-CM

## 2015-09-03 LAB — CBC WITH DIFFERENTIAL/PLATELET
BASOS ABS: 0 10*3/uL (ref 0.0–0.1)
Basophils Relative: 0 %
Eosinophils Absolute: 0.1 10*3/uL (ref 0.0–0.7)
Eosinophils Relative: 1 %
HEMATOCRIT: 39.9 % (ref 39.0–52.0)
HEMOGLOBIN: 13 g/dL (ref 13.0–17.0)
LYMPHS PCT: 6 %
Lymphs Abs: 1 10*3/uL (ref 0.7–4.0)
MCH: 30 pg (ref 26.0–34.0)
MCHC: 32.6 g/dL (ref 30.0–36.0)
MCV: 91.9 fL (ref 78.0–100.0)
MONO ABS: 1 10*3/uL (ref 0.1–1.0)
MONOS PCT: 6 %
NEUTROS ABS: 16.5 10*3/uL — AB (ref 1.7–7.7)
Neutrophils Relative %: 87 %
Platelets: 146 10*3/uL — ABNORMAL LOW (ref 150–400)
RBC: 4.34 MIL/uL (ref 4.22–5.81)
RDW: 15.8 % — AB (ref 11.5–15.5)
WBC: 18.7 10*3/uL — ABNORMAL HIGH (ref 4.0–10.5)

## 2015-09-03 LAB — BASIC METABOLIC PANEL
ANION GAP: 11 (ref 5–15)
BUN: 14 mg/dL (ref 6–20)
CO2: 29 mmol/L (ref 22–32)
Calcium: 9.5 mg/dL (ref 8.9–10.3)
Chloride: 102 mmol/L (ref 101–111)
Creatinine, Ser: 1.12 mg/dL (ref 0.61–1.24)
GLUCOSE: 183 mg/dL — AB (ref 65–99)
POTASSIUM: 4 mmol/L (ref 3.5–5.1)
Sodium: 142 mmol/L (ref 135–145)

## 2015-09-03 LAB — I-STAT TROPONIN, ED: Troponin i, poc: 0 ng/mL (ref 0.00–0.08)

## 2015-09-03 LAB — BRAIN NATRIURETIC PEPTIDE: B NATRIURETIC PEPTIDE 5: 77.9 pg/mL (ref 0.0–100.0)

## 2015-09-03 MED ORDER — METHYLPREDNISOLONE SODIUM SUCC 125 MG IJ SOLR
125.0000 mg | Freq: Once | INTRAMUSCULAR | Status: AC
  Administered 2015-09-03: 125 mg via INTRAVENOUS
  Filled 2015-09-03: qty 2

## 2015-09-03 MED ORDER — IPRATROPIUM BROMIDE 0.02 % IN SOLN
1.0000 mg | RESPIRATORY_TRACT | Status: AC
  Administered 2015-09-03: 1 mg via RESPIRATORY_TRACT
  Filled 2015-09-03: qty 5

## 2015-09-03 MED ORDER — ALBUTEROL (5 MG/ML) CONTINUOUS INHALATION SOLN
10.0000 mg/h | INHALATION_SOLUTION | RESPIRATORY_TRACT | Status: DC
  Administered 2015-09-03: 10 mg/h via RESPIRATORY_TRACT
  Filled 2015-09-03: qty 20

## 2015-09-03 MED ORDER — DOXYCYCLINE HYCLATE 100 MG PO TABS
100.0000 mg | ORAL_TABLET | Freq: Once | ORAL | Status: AC
Start: 2015-09-03 — End: 2015-09-03
  Administered 2015-09-03: 100 mg via ORAL
  Filled 2015-09-03: qty 1

## 2015-09-03 NOTE — ED Notes (Signed)
Pt requesting that the non-rebreather mask be removed.  Replaced with nasal cannula 6 liters

## 2015-09-03 NOTE — ED Notes (Signed)
Admitting doctor at the bedside 

## 2015-09-03 NOTE — ED Notes (Signed)
The pt is c/o difficulty breathing for several days with a productive cough  Thick green sputum.  He reports that he has had   Lung problems for about 10 years.  Smoker about 10 years ago  None now.  Alert oriented skin warm and dry  Moderate resp;iratory distress.     sats low in triage.  He wears home 02

## 2015-09-03 NOTE — ED Provider Notes (Signed)
CSN: [id number]     Arrival date & time 09/03/15  2128 History   First MD Initiated Contact with Patient 09/03/15 2139     Chief Complaint  Patient presents with  . Shortness of Breath     (Consider location/radiation/quality/duration/timing/severity/associated sxs/prior Treatment) Patient is a 74 y.o. male presenting with shortness of breath. The history is provided by the patient.  Shortness of Breath Severity:  Severe Onset quality:  Gradual Duration:  2 days Timing:  Constant Progression:  Worsening Chronicity:  Recurrent Context comment:  History of COPD Relieved by:  Nothing Worsened by:  Exertion, weather changes and movement Ineffective treatments:  Rest, oxygen and inhaler Associated symptoms: cough, sputum production and wheezing   Associated symptoms: no abdominal pain, no chest pain, no claudication, no diaphoresis, no ear pain, no fever, no headaches, no hemoptysis, no neck pain, no PND, no rash, no sore throat, no syncope, no swollen glands and no vomiting   Risk factors: obesity   Risk factors: no hx of PE/DVT, no prolonged immobilization and no recent surgery     Past Medical History  Diagnosis Date  . Diabetes mellitus without complication (HCC)   . COPD (chronic obstructive pulmonary disease) (HCC)   . Hypertension   . Renal disorder     kidney stones  . CVA (cerebral infarction)   . Aortic stenosis     moderate by echo 10/2013  . Paroxysmal atrial fibrillation [facility])    Past Surgical History  Procedure Laterality Date  . Tee without cardioversion N/A 10/23/2013    Procedure: TRANSESOPHAGEAL ECHOCARDIOGRAM (TEE);  Surgeon: [name], MD;  Location: [facility] ENDOSCOPY;  Service: Cardiovascular;  Laterality: N/A;  . 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 COPD Father   . COPD Brother    Social History  Substance Use Topics  . Smoking status:  Former Smoker -- 1.00 packs/day for 30 years    Types: Cigarettes    Quit date: 09/14/2003  . Smokeless tobacco: Current User    Types: Chew  . Alcohol Use: No    Review of Systems  Constitutional: Negative for fever and diaphoresis.  HENT: Negative for ear pain and sore throat.   Eyes: Negative for pain.  Respiratory: Positive for cough, sputum production, chest tightness, shortness of breath and wheezing. Negative for hemoptysis.   Cardiovascular: Negative for chest pain, claudication, syncope and PND.  Gastrointestinal: Positive for nausea. Negative for vomiting, abdominal pain, diarrhea and constipation.  Genitourinary: Negative for dysuria.  Musculoskeletal: Negative for neck pain.  Skin: Negative for rash.  Neurological: Negative for dizziness and headaches.  Psychiatric/Behavioral: Negative for confusion.      Allergies  Symbicort  Home Medications   Prior to Admission medications   Medication Sig Start Date End Date Taking? Authorizing Provider  albuterol (PROVENTIL HFA;VENTOLIN HFA) 108 (90 BASE) MCG/ACT inhaler Inhale 1 puff into the lungs every 6 (six) hours as needed for wheezing or shortness of breath. 04/23/14  Yes [name], MD  amLODipine (NORVASC) 10 MG tablet Take 10 mg by mouth daily.   Yes Historical Provider, MD  atorvastatin (LIPITOR) 20 MG tablet Take 0.5 tablets (10 mg total) by mouth daily at 6 PM. KEEP OV. 03/18/15  Yes [name], MD  beta carotene w/minerals (OCUVITE) tablet Take 1 tablet by mouth 2 (two) times daily.   Yes Historical Provider, MD  canagliflozin (INVOKANA) 300 MG TABS tablet Take 300 mg by mouth daily before breakfast.   Yes Historical Provider, MD  Fluticasone-Salmeterol (ADVAIR) 250-50 MCG/DOSE AEPB Inhale 1 puff into the lungs 2 (two) times daily.   Yes Historical Provider, MD  insulin glargine (LANTUS) 100 UNIT/ML injection Inject 16 Units into the skin at bedtime.    Yes Historical Provider, MD  losartan (COZAAR) 100 MG  tablet Take 100 mg by mouth daily.   Yes Historical Provider, MD  metFORMIN (GLUCOPHAGE) 500 MG tablet Take 1 tablet (500 mg total) by mouth 2 (two) times daily with a meal. 10/23/13  Yes Catarina Hartshornavid Tat, MD  Multiple Vitamins-Minerals (MULTIVITAMIN PO) Take 1 tablet by mouth daily.   Yes Historical Provider, MD  OXYGEN-HELIUM IN Inhale 6 L into the lungs continuous.    Yes Historical Provider, MD  rivaroxaban (XARELTO) 20 MG TABS tablet Take 1 tablet (20 mg total) by mouth daily with supper. 01/23/15  Yes Hillis RangeJames Allred, MD  sertraline (ZOLOFT) 100 MG tablet Take 100 mg by mouth daily.   Yes Historical Provider, MD  sitaGLIPtin (JANUVIA) 100 MG tablet Take 100 mg by mouth daily.   Yes Historical Provider, MD  Spacer/Aero-Holding Chambers (AEROCHAMBER MINI CHAMBER) DEVI Use as directed 10/31/13  Yes Leslye Peerobert S Byrum, MD  tiotropium (SPIRIVA) 18 MCG inhalation capsule Place 18 mcg into inhaler and inhale daily.   Yes Historical Provider, MD   BP 129/62 mmHg  Pulse 107  Temp(Src) 99.8 F (37.7 C) (Oral)  Resp 20  Ht 5\' 4"  (1.626 m)  Wt 92.987 kg  BMI 35.17 kg/m2  SpO2 97% Physical Exam  Constitutional: He appears well-developed and well-nourished. He has a sickly appearance. He appears ill.  HENT:  Head: Head is without abrasion, without contusion and without laceration.  Nose: No rhinorrhea or sinus tenderness.  Eyes: Conjunctivae and EOM are normal. Pupils are equal, round, and reactive to light.  Cardiovascular: Regular rhythm.  Tachycardia present.  Exam reveals no decreased pulses.   Pulmonary/Chest: Accessory muscle usage present. Tachypnea noted. He has decreased breath sounds in the right upper field, the right middle field, the right lower field, the left upper field, the left middle field and the left lower field.  Abdominal: There is no tenderness. There is no rigidity, no guarding, no CVA tenderness, no tenderness at McBurney's point and negative Murphy's sign. No hernia.  Neurological: He  is alert. No cranial nerve deficit or sensory deficit. GCS eye subscore is 4. GCS verbal subscore is 5. GCS motor subscore is 6.  Psychiatric: He has a normal mood and affect. His speech is rapid and/or pressured.    ED Course  Procedures (including critical care time) Labs Review Labs Reviewed  CBC WITH DIFFERENTIAL/PLATELET - Abnormal; Notable for the following:    WBC 18.7 (*)    RDW 15.8 (*)    Platelets 146 (*)    Neutro Abs 16.5 (*)    All other components within normal limits  BASIC METABOLIC PANEL - Abnormal; Notable for the following:    Glucose, Bld 183 (*)    All other components within normal limits  BRAIN NATRIURETIC PEPTIDE  I-STAT TROPOININ, ED    Imaging Review Dg Chest Port 1 View  09/03/2015  CLINICAL DATA:  74 year old male with shortness of breath and chest tightness EXAM: PORTABLE CHEST 1 VIEW COMPARISON:  Radiograph dated 05/1915 FINDINGS: Single-view of the chest demonstrate bibasilar linear atelectatic changes/ scarring. No focal consolidation, pleural effusion, or pneumothorax. Stable cardiac silhouette.  The osseous structures are grossly unremarkable. IMPRESSION: No active disease. Electronically Signed   By: Elgie Collard M.D.   On: 09/03/2015 21:54   I have personally reviewed and evaluated these images and lab results as part of my medical decision-making.   EKG Interpretation None      MDM   Final diagnoses:  COPD exacerbation (HCC)    74 year old Caucasian gentleman with past medical history of COPD on 6 L home oxygen presents in setting of shortness of breath, cough, increased sputum production. Per patient and family the symptoms have been worsening over the last 2 days. Patient has been taking albuterol without improvement in symptoms. On arrival patient was afebrile but had significant tachypnea. Initial oxygen saturations were in the 60s to 70s on 6 L. Patient started medially on nonrebreather with improvement in oxygenation. Auscultation  revealed minimal air movement and patient started on continuous nebulized treatment with albuterol and Atrovent. Additionally obtained EKG, CBC, BNP, BMP, chest x-ray and patient given 125 mg of Solu-Medrol for likely COPD exacerbation. Patient family deny fevers, headaches, chest pain, nausea, vomiting, Cox patient, diarrhea, recent sick contacts, recent travel, history of blood clots.  Chest x-ray which I personally reviewed reveals no focal consolidation or other acute cardiopulmonary abnormality. No significant elevation in BNP. Patient has elevation in white blood cell count. No significant anemia. EKG did not reveal signs of acute ischemia. No elevation in troponin. On reevaluation after nebulizer treatment patient had wheezing in all lung fields and tachypnea had improved. Considering these findings this is likely COPD exacerbation. Patient given doxycycline.  Will admit to medicine at this time for further management COPD exacerbation. Patient stable at time of transfer of care.  Attending has seen and evaluated patient and Dr. Radford Pax is in agreement with plan.    Stacy Gardner, MD 09/03/15 9147  Nelva Nay, MD 09/11/15 [phone]

## 2015-09-03 NOTE — ED Notes (Signed)
sats with nasal cannula in place is 89 and above but only slightly.  Venturi mask replaced nasal cannula.  Venturi at 50  trial

## 2015-09-03 NOTE — ED Notes (Signed)
Pt is more  Relaxed his breathing is better

## 2015-09-04 DIAGNOSIS — J441 Chronic obstructive pulmonary disease with (acute) exacerbation: Secondary | ICD-10-CM | POA: Insufficient documentation

## 2015-09-04 DIAGNOSIS — F329 Major depressive disorder, single episode, unspecified: Secondary | ICD-10-CM

## 2015-09-04 DIAGNOSIS — F32A Depression, unspecified: Secondary | ICD-10-CM

## 2015-09-04 LAB — GLUCOSE, CAPILLARY
GLUCOSE-CAPILLARY: 220 mg/dL — AB (ref 65–99)
GLUCOSE-CAPILLARY: 275 mg/dL — AB (ref 65–99)
GLUCOSE-CAPILLARY: 264 mg/dL — AB (ref 65–99)
Glucose-Capillary: 269 mg/dL — ABNORMAL HIGH (ref 65–99)
Glucose-Capillary: 301 mg/dL — ABNORMAL HIGH (ref 65–99)
Glucose-Capillary: 259 mg/dL — ABNORMAL HIGH (ref 65–99)

## 2015-09-04 LAB — INFLUENZA PANEL BY PCR (TYPE A & B)
H1N1 flu by pcr: NOT DETECTED
INFLAPCR: NEGATIVE
INFLBPCR: NEGATIVE

## 2015-09-04 LAB — EXPECTORATED SPUTUM ASSESSMENT W GRAM STAIN, RFLX TO RESP C

## 2015-09-04 LAB — EXPECTORATED SPUTUM ASSESSMENT W REFEX TO RESP CULTURE

## 2015-09-04 LAB — MRSA PCR SCREENING: MRSA BY PCR: NEGATIVE

## 2015-09-04 MED ORDER — LORAZEPAM 0.5 MG PO TABS
0.5000 mg | ORAL_TABLET | Freq: Once | ORAL | Status: AC
  Administered 2015-09-04: 0.5 mg via ORAL
  Filled 2015-09-04: qty 1

## 2015-09-04 MED ORDER — CETYLPYRIDINIUM CHLORIDE 0.05 % MT LIQD
7.0000 mL | Freq: Two times a day (BID) | OROMUCOSAL | Status: DC
  Administered 2015-09-04 – 2015-09-08 (×8): 7 mL via OROMUCOSAL

## 2015-09-04 MED ORDER — SODIUM CHLORIDE 0.9 % IJ SOLN
3.0000 mL | Freq: Two times a day (BID) | INTRAMUSCULAR | Status: DC
  Administered 2015-09-04 – 2015-09-07 (×9): 3 mL via INTRAVENOUS

## 2015-09-04 MED ORDER — GUAIFENESIN ER 600 MG PO TB12
600.0000 mg | ORAL_TABLET | Freq: Two times a day (BID) | ORAL | Status: DC
  Administered 2015-09-04 – 2015-09-08 (×10): 600 mg via ORAL
  Filled 2015-09-04 (×11): qty 1

## 2015-09-04 MED ORDER — INSULIN ASPART 100 UNIT/ML ~~LOC~~ SOLN
0.0000 [IU] | SUBCUTANEOUS | Status: DC
  Administered 2015-09-04 (×2): 5 [IU] via SUBCUTANEOUS
  Administered 2015-09-04: 7 [IU] via SUBCUTANEOUS
  Administered 2015-09-04: 3 [IU] via SUBCUTANEOUS
  Administered 2015-09-04 (×2): 5 [IU] via SUBCUTANEOUS
  Administered 2015-09-05: 2 [IU] via SUBCUTANEOUS
  Administered 2015-09-05: 5 [IU] via SUBCUTANEOUS
  Administered 2015-09-05 (×3): 3 [IU] via SUBCUTANEOUS
  Administered 2015-09-05: 2 [IU] via SUBCUTANEOUS
  Administered 2015-09-06: 5 [IU] via SUBCUTANEOUS
  Administered 2015-09-06: 2 [IU] via SUBCUTANEOUS
  Administered 2015-09-06: 3 [IU] via SUBCUTANEOUS
  Administered 2015-09-06: 2 [IU] via SUBCUTANEOUS
  Administered 2015-09-06 (×2): 5 [IU] via SUBCUTANEOUS
  Administered 2015-09-07: 3 [IU] via SUBCUTANEOUS
  Administered 2015-09-07: 2 [IU] via SUBCUTANEOUS
  Administered 2015-09-07 (×2): 3 [IU] via SUBCUTANEOUS
  Administered 2015-09-07: 7 [IU] via SUBCUTANEOUS
  Administered 2015-09-07: 5 [IU] via SUBCUTANEOUS
  Administered 2015-09-08: 7 [IU] via SUBCUTANEOUS
  Administered 2015-09-08: 2 [IU] via SUBCUTANEOUS
  Administered 2015-09-08: 3 [IU] via SUBCUTANEOUS
  Administered 2015-09-08: 1 [IU] via SUBCUTANEOUS

## 2015-09-04 MED ORDER — IPRATROPIUM-ALBUTEROL 0.5-2.5 (3) MG/3ML IN SOLN
3.0000 mL | Freq: Three times a day (TID) | RESPIRATORY_TRACT | Status: DC
  Administered 2015-09-04 – 2015-09-08 (×13): 3 mL via RESPIRATORY_TRACT
  Filled 2015-09-04 (×14): qty 3

## 2015-09-04 MED ORDER — LINAGLIPTIN 5 MG PO TABS
5.0000 mg | ORAL_TABLET | Freq: Every day | ORAL | Status: DC
  Administered 2015-09-04 – 2015-09-08 (×5): 5 mg via ORAL
  Filled 2015-09-04 (×5): qty 1

## 2015-09-04 MED ORDER — OCUVITE PO TABS
1.0000 | ORAL_TABLET | Freq: Two times a day (BID) | ORAL | Status: DC
  Administered 2015-09-04 – 2015-09-08 (×9): 1 via ORAL
  Filled 2015-09-04 (×13): qty 1

## 2015-09-04 MED ORDER — IPRATROPIUM-ALBUTEROL 0.5-2.5 (3) MG/3ML IN SOLN: 3.0000 mL | Freq: Four times a day (QID) | RESPIRATORY_TRACT | Status: DC

## 2015-09-04 MED ORDER — BUDESONIDE 0.25 MG/2ML IN SUSP
0.2500 mg | Freq: Two times a day (BID) | RESPIRATORY_TRACT | Status: DC
  Administered 2015-09-04 – 2015-09-08 (×9): 0.25 mg via RESPIRATORY_TRACT
  Filled 2015-09-04 (×10): qty 2

## 2015-09-04 MED ORDER — DOXYCYCLINE HYCLATE 100 MG PO TABS
100.0000 mg | ORAL_TABLET | Freq: Two times a day (BID) | ORAL | Status: DC
  Administered 2015-09-04 – 2015-09-08 (×9): 100 mg via ORAL
  Filled 2015-09-04 (×11): qty 1

## 2015-09-04 MED ORDER — ALBUTEROL (5 MG/ML) CONTINUOUS INHALATION SOLN
10.0000 mg/h | INHALATION_SOLUTION | RESPIRATORY_TRACT | Status: DC
  Administered 2015-09-04: 10 mg/h via RESPIRATORY_TRACT
  Filled 2015-09-04: qty 20

## 2015-09-04 MED ORDER — ALBUTEROL SULFATE (2.5 MG/3ML) 0.083% IN NEBU: 2.5000 mg | INHALATION_SOLUTION | RESPIRATORY_TRACT | Status: DC | PRN

## 2015-09-04 MED ORDER — IPRATROPIUM BROMIDE 0.02 % IN SOLN
0.5000 mg | Freq: Four times a day (QID) | RESPIRATORY_TRACT | Status: DC
  Administered 2015-09-04 (×2): 0.5 mg via RESPIRATORY_TRACT
  Filled 2015-09-04 (×2): qty 2.5

## 2015-09-04 MED ORDER — ATORVASTATIN CALCIUM 10 MG PO TABS
10.0000 mg | ORAL_TABLET | Freq: Every day | ORAL | Status: DC
  Administered 2015-09-04 – 2015-09-08 (×5): 10 mg via ORAL
  Filled 2015-09-04 (×5): qty 1

## 2015-09-04 MED ORDER — ONDANSETRON HCL 4 MG/2ML IJ SOLN: 4.0000 mg | Freq: Four times a day (QID) | INTRAMUSCULAR | Status: DC | PRN

## 2015-09-04 MED ORDER — METFORMIN HCL 500 MG PO TABS
500.0000 mg | ORAL_TABLET | Freq: Two times a day (BID) | ORAL | Status: DC
  Administered 2015-09-04 – 2015-09-08 (×9): 500 mg via ORAL
  Filled 2015-09-04 (×9): qty 1

## 2015-09-04 MED ORDER — ALBUTEROL SULFATE (2.5 MG/3ML) 0.083% IN NEBU
2.5000 mg | INHALATION_SOLUTION | Freq: Four times a day (QID) | RESPIRATORY_TRACT | Status: DC
  Administered 2015-09-04 (×2): 2.5 mg via RESPIRATORY_TRACT
  Filled 2015-09-04 (×2): qty 3

## 2015-09-04 MED ORDER — SERTRALINE HCL 100 MG PO TABS
100.0000 mg | ORAL_TABLET | Freq: Every day | ORAL | Status: DC
  Administered 2015-09-04 – 2015-09-08 (×5): 100 mg via ORAL
  Filled 2015-09-04 (×5): qty 1

## 2015-09-04 MED ORDER — LOSARTAN POTASSIUM 50 MG PO TABS
100.0000 mg | ORAL_TABLET | Freq: Every day | ORAL | Status: DC
  Administered 2015-09-04 – 2015-09-08 (×5): 100 mg via ORAL
  Filled 2015-09-04 (×6): qty 2

## 2015-09-04 MED ORDER — BENZONATATE 100 MG PO CAPS: 200.0000 mg | ORAL_CAPSULE | Freq: Two times a day (BID) | ORAL | Status: DC | PRN

## 2015-09-04 MED ORDER — INSULIN GLARGINE 100 UNIT/ML ~~LOC~~ SOLN
16.0000 [IU] | Freq: Every day | SUBCUTANEOUS | Status: DC
Start: 2015-09-04 — End: 2015-09-08
  Administered 2015-09-04 – 2015-09-07 (×5): 16 [IU] via SUBCUTANEOUS
  Filled 2015-09-04 (×7): qty 0.16

## 2015-09-04 MED ORDER — ONDANSETRON HCL 4 MG PO TABS: 4.0000 mg | ORAL_TABLET | Freq: Four times a day (QID) | ORAL | Status: DC | PRN

## 2015-09-04 MED ORDER — AMLODIPINE BESYLATE 10 MG PO TABS
10.0000 mg | ORAL_TABLET | Freq: Every day | ORAL | Status: DC
  Administered 2015-09-04 – 2015-09-08 (×5): 10 mg via ORAL
  Filled 2015-09-04 (×5): qty 1

## 2015-09-04 MED ORDER — TIOTROPIUM BROMIDE MONOHYDRATE 18 MCG IN CAPS
18.0000 ug | ORAL_CAPSULE | Freq: Every day | RESPIRATORY_TRACT | Status: DC
  Administered 2015-09-06 – 2015-09-08 (×3): 18 ug via RESPIRATORY_TRACT
  Filled 2015-09-04 (×3): qty 5

## 2015-09-04 MED ORDER — METHYLPREDNISOLONE SODIUM SUCC 125 MG IJ SOLR: 60.0000 mg | Freq: Two times a day (BID) | INTRAMUSCULAR | Status: DC

## 2015-09-04 MED ORDER — METHYLPREDNISOLONE SODIUM SUCC 125 MG IJ SOLR
60.0000 mg | Freq: Four times a day (QID) | INTRAMUSCULAR | Status: DC
  Administered 2015-09-04 – 2015-09-07 (×13): 60 mg via INTRAVENOUS
  Filled 2015-09-04 (×14): qty 2

## 2015-09-04 MED ORDER — RIVAROXABAN 20 MG PO TABS
20.0000 mg | ORAL_TABLET | Freq: Every day | ORAL | Status: DC
  Administered 2015-09-04 – 2015-09-08 (×5): 20 mg via ORAL
  Filled 2015-09-04 (×6): qty 1

## 2015-09-04 NOTE — Progress Notes (Signed)
Pt arrived to unit as transfer from 3S.  Telemetry applied and CCMD notified.  Pt IV was dislodged upon arrival to unit.  Pt and family oriented to room including call light and telephone.  Pt on 6L of oxygen upon arrival and denies SOB.  Spoke with family regarding Pt current medication regimen, family expressed concern regarding Pt with frequent anxiety.  Will continue to monitor and provide education and support as needed.

## 2015-09-04 NOTE — ED Notes (Signed)
Report called to [phone]  To [place]

## 2015-09-04 NOTE — Progress Notes (Signed)
Albuterol hour long nebulizer complete placed patient on 5lpm. Will monitor Sp02 levels and work of breathing .

## 2015-09-04 NOTE — H&P (Signed)
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 Patient notes that symptoms initially started approximately 4 days ago after going to the grocery store. Describes cough as a productive cough that initially started out with whitish sputum that has changed in color to a more greenish yellow color. Sputum consistency is more thick. Patient reported shortness of breath that progressively worsened until today where he just worn out from doing any kind of activity. Tried using his inhalers without relief of breath symptoms. He reports associated symptoms of body aches, chest tightness, wheezing, and decreased appetite since Saturday. Up-to-date on his flu and pneumonia vaccines. Denies any recent sick contacts to his knowledge. Patient has a pulmonologist that he sees regularly but has not ever been referred to pulmonary rehabilitation. He reports never having symptoms like this requiring hospitalization in the past. Had a remote history of smoking, but notes only using smokeless chew tobacco at this time.     Review of Systems  Constitutional: Positive for malaise/fatigue.       Decreased appetite  HENT: Negative for ear pain and sore throat.   Eyes: Negative for photophobia and pain.  Respiratory: Positive for cough, sputum production, shortness of breath and wheezing. Negative for hemoptysis.   Cardiovascular: Negative for chest pain and leg swelling.  Gastrointestinal: Negative for nausea, vomiting and abdominal pain.   Genitourinary: Negative for urgency and frequency.  Musculoskeletal: Positive for joint pain. Negative for falls.  Skin: Negative for itching and rash.  Neurological: Positive for weakness. Negative for tremors and focal weakness.  Endo/Heme/Allergies: Negative for polydipsia. Bruises/bleeds easily.  Psychiatric/Behavioral: Negative for substance abuse. The patient has insomnia.       Past Medical History  Diagnosis Date  . Diabetes mellitus without complication (HCC)   . COPD (chronic obstructive pulmonary disease) (HCC)   . Hypertension   . Renal disorder     kidney stones  . CVA (cerebral infarction)   . Aortic stenosis     moderate by echo 10/2013  . Paroxysmal atrial fibrillation [facility])      Past Surgical History  Procedure Laterality Date  . Tee without cardioversion N/A 10/23/2013    Procedure: TRANSESOPHAGEAL ECHOCARDIOGRAM (TEE);  Surgeon: [name], MD;  Location: [facility] ENDOSCOPY;  Service: Cardiovascular;  Laterality: N/A;  . 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 His smoking use included Cigarettes. He has a 30 pack-year smoking history. His smokeless tobacco use includes Chew. He reports that he does not drink alcohol. His drug history is not on file. Where does patient live--home and with whom if at home? Can patient participate in ADLs? Yes  Allergies  Allergen Reactions  . Symbicort [Budesonide-Formoterol Fumarate] Anaphylaxis and Swelling    Tongue swells    Family History  Problem Relation Age of Onset  . COPD Father   . COPD Brother       Prior to Admission medications  Medication Sig Start Date End Date Taking? Authorizing Provider  albuterol (PROVENTIL HFA;VENTOLIN HFA) 108 (90 BASE) MCG/ACT inhaler Inhale 1 puff into the lungs every 6 (six) hours as needed for wheezing or shortness of breath. 04/23/14  Yes [name], MD  amLODipine (NORVASC) 10 MG tablet Take 10 mg by mouth daily.   Yes Historical Provider, MD  atorvastatin (LIPITOR) 20 MG tablet Take 0.5 tablets (10 mg total) by mouth daily at 6 PM. KEEP OV. 03/18/15  Yes [name], MD  beta carotene w/minerals (OCUVITE) tablet Take 1 tablet by mouth 2 (two) times daily.   Yes Historical Provider, MD  canagliflozin (INVOKANA) 300 MG TABS tablet Take 300 mg by mouth daily before breakfast.   Yes Historical Provider, MD  Fluticasone-Salmeterol (ADVAIR) 250-50 MCG/DOSE AEPB Inhale 1 puff into the lungs 2 (two) times daily.   Yes Historical Provider, MD  insulin glargine (LANTUS) 100 UNIT/ML injection Inject 16 Units into the skin at bedtime.    Yes Historical Provider, MD  losartan (COZAAR) 100 MG tablet Take 100 mg by mouth daily.   Yes Historical Provider, MD  metFORMIN (GLUCOPHAGE) 500 MG tablet Take 1 tablet (500 mg total) by mouth 2 (two) times daily with a meal. 10/23/13  Yes [name], MD  Multiple Vitamins-Minerals (MULTIVITAMIN PO) Take 1 tablet by mouth daily.   Yes Historical Provider, MD  OXYGEN-HELIUM IN Inhale 6 L into the lungs continuous.    Yes Historical Provider, MD  rivaroxaban (XARELTO) 20 MG TABS tablet Take 1 tablet (20 mg total) by mouth daily with supper. 01/23/15  Yes [name], MD  sertraline (ZOLOFT) 100 MG tablet Take 100 mg by mouth daily.   Yes Historical Provider, MD  sitaGLIPtin (JANUVIA) 100 MG tablet Take 100 mg by mouth daily.   Yes Historical Provider, MD  Spacer/Aero-Holding Chambers (AEROCHAMBER MINI CHAMBER) DEVI Use as directed 10/31/13  Yes [name], MD  tiotropium (SPIRIVA) 18 MCG inhalation capsule Place 18 mcg into inhaler and inhale daily.   Yes Historical Provider, MD     Physical Exam: Filed Vitals:   09/03/15 2315 09/03/15 2330 09/03/15 2345 09/04/15 0015  BP: 129/62 133/71 111/71 124/64  Pulse: 107 109 108 98  Temp:      TempSrc:      Resp: 20 32 26 20  Height:      Weight:      SpO2:  97% 99% 89% 93%     Constitutional: Vital signs reviewed. Patient is a obese male in moderate respiratory distress, but unable to cooperate with exam. Head: Normocephalic and atraumatic  Ear: TM normal bilaterally  Mouth: no erythema or exudates, MMM  Eyes: PERRL, EOMI, conjunctivae normal, No scleral icterus.  Neck: Supple, Trachea midline normal ROM, No JVD, mass, thyromegaly, or carotid bruit present.  Cardiovascular: Irregular irregular rhythm  Pulmonary/Chest: Decreased aeration with expiratory wheezes heard bilaterally. Patient using accessory muscles initially but improved following breathing treatment  Abdominal: Soft. Non-tender, non-distended, bowel sounds are normal, no masses, organomegaly, or guarding present.  GU: no CVA tenderness Musculoskeletal: No joint deformities, erythema, or stiffness, ROM full and no nontender Ext: no edema and no cyanosis, pulses palpable bilaterally (DP and PT)  Hematology: no cervical, inginal, or axillary adenopathy.  Neurological: A&O x3, Strenght is normal and symmetric bilaterally, cranial nerve II-XII are grossly intact, no focal motor deficit, sensory intact to light touch bilaterally.  Skin: Warm, dry and intact. No rash, cyanosis, or clubbing.  Psychiatric: Normal mood  and affect. speech and behavior is normal. Judgment and thought content normal. Cognition and memory are normal.      Data Review   Micro Results No results found for this or any previous visit (from the past 240 hour(s)).  Radiology Reports Ct Abdomen Pelvis W Wo Contrast  08/12/2015  CLINICAL DATA:  Right hydronephrosis on recent abdominal sonogram. EXAM: CT ABDOMEN AND PELVIS WITHOUT AND WITH CONTRAST TECHNIQUE: Multidetector CT imaging of the abdomen and pelvis was performed following the standard protocol before and following the bolus administration of intravenous contrast. CONTRAST:  ISOVUE-300 IOPAMIDOL (ISOVUE-300) INJECTION 61% COMPARISON:  07/30/2015  abdominal sonogram. FINDINGS: Images are motion degraded. Lower chest: No significant gross pulmonary nodules or acute consolidative airspace disease. Aortic valvular calcifications are partially visualized. Hepatobiliary: Normal liver with no liver mass. There is a 1.1 cm calcified gallstone layering in the nondistended gallbladder, with no gallbladder wall thickening or pericholecystic fat stranding or fluid. No biliary ductal dilatation. Pancreas: Normal, with no mass or duct dilation. Spleen: Normal size. No mass. Adrenals/Urinary Tract: Normal adrenals. There are clustered 6 mm, 4 mm and 3 mm stones in the mid to upper lumbar segment of the right ureter. There is severe right hydroureteronephrosis to the level of the mid to lower lumbar segment of the right ureter. There is near complete right renal parenchymal atrophy. There is an exophytic hyperdense nonenhancing 1.6 cm lesion in the upper right kidney, in keeping with a Bosniak category 2 hemorrhagic/ proteinaceous renal cyst. There is an exophytic hyperdense nonenhancing 2.2 cm lesion in the lateral lower right kidney, in keeping with a Bosniak category 2 hemorrhagic/proteinaceous renal cyst. There is a delayed right contrast nephrogram, with no excretion of contrast into the right renal collecting system on the delayed sequence. No left nephrolithiasis. No left hydronephrosis. There is an exophytic 1.3 cm simple renal cyst in the lower left kidney. There is compensatory hypertrophy of the left kidney. Normal caliber left ureter with no left ureteral stones. On delayed imaging, there is no urothelial wall thickening and there are no filling defects in the opacified portions of the left collecting system or left ureter, noting non opacification of portions of the pelvic segment of the left ureter, which limits evaluation in these locations. A tiny portion of the right superior bladder extends into the right inguinal hernia sac. Otherwise normal bladder, with  no bladder wall thickening. Stomach/Bowel: Grossly normal stomach. There is a 5.2 x 3.4 cm duodenal diverticulum extending superiorly from the fourth portion of the duodenum. Normal caliber small bowel with no small bowel wall thickening. The normal appendix partially extends into the right inguinal hernia sac. Marked sigmoid diverticulosis, with no large bowel wall thickening or pericolonic fat stranding. Vascular/Lymphatic: Atherosclerotic nonaneurysmal abdominal aorta. Patent portal, splenic, hepatic and renal veins. No pathologically enlarged lymph nodes in the abdomen or pelvis. Reproductive: Mild prostatomegaly, with nonspecific internal prostatic calcification. Other: No pneumoperitoneum, ascites or focal fluid collection. Musculoskeletal: No aggressive appearing focal osseous lesions. Moderate right inguinal hernia, which contains portions of the appendix and superior bladder. Diffuse osteopenia. Mild-to-moderate degenerative changes in the visualized thoracolumbar spine. IMPRESSION: 1. Severe right hydroureteronephrosis to the level of the mid to lower lumbar segment of the right ureter. Numerous clustered stones in the proximal right ureter. The right renal collecting system and right ureter remain unopacified on the delayed sequence due to right renal dysfunction, and as such an underlying urothelial lesion cannot be excluded, although the findings favor a chronic benign right  ureteral stricture given the advanced right renal parenchymal atrophy and the absence of an obvious mass or wall thickening in the right ureter. 2. Two Bosniak category 2 hemorrhagic/proteinaceous nonenhancing renal cysts in the right kidney. 3. Compensatory hypertrophy of the left kidney, which contains a small simple renal cysts and is otherwise normal. 4. Moderate right inguinal hernia containing portions of the appendix and right superior bladder. 5. Mild prostatomegaly. 6. Cholelithiasis. 7. Marked sigmoid diverticulosis.  Electronically Signed   By: Delbert Phenix M.D.   On: 08/12/2015 17:11   Dg Chest Port 1 View  09/03/2015  CLINICAL DATA:  74 year old male with shortness of breath and chest tightness EXAM: PORTABLE CHEST 1 VIEW COMPARISON:  Radiograph dated 05/1915 FINDINGS: Single-view of the chest demonstrate bibasilar linear atelectatic changes/ scarring. No focal consolidation, pleural effusion, or pneumothorax. Stable cardiac silhouette. The osseous structures are grossly unremarkable. IMPRESSION: No active disease. Electronically Signed   By: Elgie Collard M.D.   On: 09/03/2015 21:54     CBC  Recent Labs Lab 09/03/15 2143  WBC 18.7*  HGB 13.0  HCT 39.9  PLT 146*  MCV 91.9  MCH 30.0  MCHC 32.6  RDW 15.8*  LYMPHSABS 1.0  MONOABS 1.0  EOSABS 0.1  BASOSABS 0.0    Chemistries   Recent Labs Lab 09/03/15 2143  NA 142  K 4.0  CL 102  CO2 29  GLUCOSE 183*  BUN 14  CREATININE 1.12  CALCIUM 9.5   ------------------------------------------------------------------------------------------------------------------ estimated creatinine clearance is 57.7 mL/min (by C-G formula based on Cr of 1.12). ------------------------------------------------------------------------------------------------------------------ No results for input(s): HGBA1C in the last 72 hours. ------------------------------------------------------------------------------------------------------------------ No results for input(s): CHOL, HDL, LDLCALC, TRIG, CHOLHDL, LDLDIRECT in the last 72 hours. ------------------------------------------------------------------------------------------------------------------ No results for input(s): TSH, T4TOTAL, T3FREE, THYROIDAB in the last 72 hours.  Invalid input(s): FREET3 ------------------------------------------------------------------------------------------------------------------ No results for input(s): VITAMINB12, FOLATE, FERRITIN, TIBC, IRON, RETICCTPCT in the last 72  hours.  Coagulation profile No results for input(s): INR, PROTIME in the last 168 hours.  No results for input(s): DDIMER in the last 72 hours.  Cardiac Enzymes No results for input(s): CKMB, TROPONINI, MYOGLOBIN in the last 168 hours.  Invalid input(s): CK ------------------------------------------------------------------------------------------------------------------ Invalid input(s): POCBNP   CBG: No results for input(s): GLUCAP in the last 168 hours.     EKG: Independently reviewed. Atrial fibrillation   Assessment/Plan  Acute COPD exacerbation: Patient is oxygen dependent and wears 6 L nasal cannula oxygen at baseline. Three-day history of shortness of breath with increased cough and sputum production. Chest x-ray showing no acute signs of infection. Patient requiring nonrebreather to improve O2 sats 96% - Admit to stepdown - Continuous pulse oximetry and goal to keep O2 sats greater than 92% - Respiratory therapist consult  - Checking sputum Gram stain/cultures/ legionella/ strep/influenza - Continuous nebulized treatment 1 - Antibiotics of doxycycline - Albuterol and budesonide nebs scheduled - Methylprednisolone IV 60 mg every 12 hours - Continue Spiriva - Mucinex  - COPD order set protocol including PT, case management ? Last pFTs and if patient is a candidate for pulmonary rehabilitation at some point   Paroxysmal atrial fibrillation on Xarelto: Heart rates <110's. chadvasc score 4 currently on anticoagulation of Xarelto. -Continue Xarelto  Diabetes mellitus type 2: Last hemoglobin A1c on record was noted to be 6.7 in 10/2013. - Held patient's home oral hypoglycemic agents - continue patient's long-acting medication of Lantus - CBG checks every 4 hours with sliding scale insulin changed to  qAC when able  Hypertension -  Continue amlodipine and losartan  Thrombocytopenia: Platelet count noted to be 146 mildly decreased. - Continue to  monitor  Depression -Continue home medication of Zoloft  History of chewing tobacco: Patient reports previous history of smoking tobacco but quit many years ago and now continues to chew tobacco regularly. - Counseled on chewing tobacco cessation   Code Status:   full Family Communication: bedside Disposition Plan: admit   Total time spent 55 minutes.Greater than 50% of this time was spent in counseling, explanation of diagnosis, planning of further management, and coordination of care  Clydie BraunRondell A Vincent Ehrler [facility] Pager [phone]  If 7PM-7AM, please contact night-coverage [email or web address] Password [email or web address] 09/04/2015, 12:42 AM

## 2015-09-04 NOTE — Evaluation (Signed)
Physical Therapy Evaluation Patient Details Name: Brett Michael MRN: [id number] DOB: [birth year] Today's Date: 09/04/2015   History of Present Illness  Pt is a 74 y/o M w/ acute COPD exacerbation.  PMH includes CVA, aortic stenosis.  Clinical Impression  Pt admitted with above diagnosis. Pt currently with functional limitations due to the deficits listed below (see PT Problem List).  Brett Michael ambulated in hall but SpO2 dropped to 74% on 6 LPM.  Will benefit from cardiopulmonary rehab.  He will have 24/7 assist available from family at d/c.  Pt will benefit from skilled PT to increase their independence and safety with mobility to allow discharge to the venue listed below.      Follow Up Recommendations No PT follow up;Supervision/Assistance - 24 hour    Equipment Recommendations  Rolling walker with 5" wheels    Recommendations for Other Services Other (comment) (Cardiopulmonary Rehab)     Precautions / Restrictions Precautions Precautions: Fall Precaution Comments: monitor O2 Restrictions Weight Bearing Restrictions: No      Mobility  Bed Mobility Overal bed mobility: Modified Independent             General bed mobility comments: HOB elevated, no verbal cues or physical assist required  Transfers Overall transfer level: Needs assistance Equipment used: None Transfers: Sit to/from Stand Sit to Stand: Min assist         General transfer comment: 1 person HHA to stablize.  Pt w/ LOB standing at bedside and requires min assist to steady.  Denies dizziness.    Ambulation/Gait Ambulation/Gait assistance: Min assist Ambulation Distance (Feet): 35 Feet Assistive device: 1 person hand held assist Gait Pattern/deviations: Step-through pattern;Antalgic;Staggering left;Staggering right;Decreased stride length;Drifts right/left   Gait velocity interpretation: Below normal speed for age/gender General Gait Details: Pt occassionally staggers Lt and Rt and drifts Rt  toward rail in hallway, requring min assist to steady.  SpO2 down to 74% on 6 LPM and needed ~5 minutes for SpO2 to get to 85% after bumped up to 8 LPM.  4/4 DOE.  Stairs            Wheelchair Mobility    Modified Rankin (Stroke Patients Only)       Balance Overall balance assessment: Needs assistance Sitting-balance support: Feet supported Sitting balance-Leahy Scale: Good     Standing balance support: During functional activity Standing balance-Leahy Scale: Poor Standing balance comment: Relies on assist for support                             Pertinent Vitals/Pain Pain Assessment: No/denies pain    Home Living Family/patient expects to be discharged to:: Private residence Living Arrangements: Alone Available Help at Discharge: Family;Available 24 hours/day Type of Home: House Home Access: Stairs to enter Entrance Stairs-Rails: LawyerLeft;Right Entrance Stairs-Number of Steps: 3 Home Layout: One level Home Equipment: Cane - single point Additional Comments: Pt has his own house on the same property as his family.    Prior Function Level of Independence: Independent               Hand Dominance        Extremity/Trunk Assessment   Upper Extremity Assessment: Defer to OT evaluation;Overall WFL for tasks assessed           Lower Extremity Assessment: Overall WFL for tasks assessed         Communication   Communication: No difficulties  Cognition Arousal/Alertness: Awake/alert Behavior During Therapy:  WFL for tasks assessed/performed Overall Cognitive Status: Within Functional Limits for tasks assessed                      General Comments      Exercises General Exercises - Lower Extremity Ankle Circles/Pumps: AROM;Both;10 reps;Seated Long Arc Quad: AROM;Both;10 reps;Seated Hip Flexion/Marching: AROM;Both;10 reps;Seated      Assessment/Plan    PT Assessment Patient needs continued PT services  PT Diagnosis  Difficulty walking   PT Problem List Decreased activity tolerance;Decreased balance;Decreased mobility;Decreased knowledge of use of DME;Decreased safety awareness;Cardiopulmonary status limiting activity  PT Treatment Interventions DME instruction;Gait training;Stair training;Functional mobility training;Therapeutic activities;Therapeutic exercise;Balance training;Neuromuscular re-education;Patient/family education   PT Goals (Current goals can be found in the Care Plan section) Acute Rehab PT Goals Patient Stated Goal: to go home before Christmas PT Goal Formulation: With patient/family Time For Goal Achievement: 09/18/15 Potential to Achieve Goals: Good    Frequency Min 3X/week   Barriers to discharge Inaccessible home environment steps to enter home.  Family is in the process of building ramp but likely won't be built before pt d/c.    Co-evaluation               End of Session Equipment Utilized During Treatment: Gait belt;Oxygen Activity Tolerance: Other (comment);Patient limited by fatigue (limited by SpO2) Patient left: in chair;with call bell/phone within reach;with family/visitor present Nurse Communication: Mobility status;Other (comment) (SpO2)         Time: 9147-8295 PT Time Calculation (min) (ACUTE ONLY): 34 min   Charges:   PT Evaluation $Initial PT Evaluation Tier I: 1 Procedure PT Treatments $Gait Training: 8-22 mins   PT G CodesMichail Jewels PT, DPT [phone] Pager: [phone] 09/04/2015, 3:56 PM

## 2015-09-04 NOTE — Progress Notes (Signed)
Placed patient on continuous nebulizer treatment to deliver 10mg /hr of albuterol.

## 2015-09-04 NOTE — Progress Notes (Signed)
PT Cancellation Note  Patient Details Name: Brett Michael MRN: [id number] DOB: [birth year]   Cancelled Treatment:    Reason Eval/Treat Not Completed: Other (comment).  Attempted to see pt x2, pt eating both times.  Will attempt to see pt again later today, time permitting.  Michail JewelsAshley Parr PT, DPT [phone] Pager: [phone] 09/04/2015, 1:19 PM

## 2015-09-04 NOTE — Progress Notes (Signed)
[facility] PROGRESS NOTE  Brett Michael JXB:[id number] DOB: 08/20/1941 DOA: 09/03/2015 PCP: Tommy Rainwater, MD  Assessment/Plan: 1. COPD exacerbation -Patient with history of advanced COPD and chronic hypoxemic respiratory failure who requires 4-6 liters of supplemental oxygen at baseline, presented with worsening shortness of breath associate with cough. -Suspect that underlying infectious process precipitating COPD exacerbation. -Will continue systemic steroids with slight medial 6 mg IV every 6 hours along with nebulizer treatments and empiric antibiotic therapy.  -Patient showing clinical improvement will transfer to telemetry.  2.  Possible community acquired pneumonia -Patient reporting productive cough with green to yellow sputum production. -He present with COPD exacerbation -Will continue empiric antibiotic therapy with doxycycline. -Initial chest x-ray did not show acute consolidation -Will repeat CXR in am to ensure stability  3.  Acute on chronic hypoxemic respiratory failure -Evidenced by patient presented in respiratory distress, requiring a 100% nonrebreather having a respiratory rate of 25 on admission. -Secondary to COPD exacerbation -Will continue treating with IV steroids, empiric antibiotic therapy and DuoNeb's  4.  History of paroxysmal atrial fibrillation  -CHADSVasc score of 4 -Continue Xarelto -Telemetry showing normal sinus rhythm with heart rates fluctuating between 70s to 120s. I think sinus tachycardia likely secondary to COPD exacerbation   5.   Type 2 diabetes mellitus. -Patient having elevated blood sugars in the 203 100 range, likely precipitated by systemic steroids -Will restart Januvia 100 mg by mouth daily and metformin 500 mg by mouth twice a day, continue Lantus 16 units at bedtime  Code Status: full code  Family Communication: spoke to his son is present at bedside Disposition Plan: Will transfer to telemetry      Antibiotics:  Doxy   HPI/Subjective: Brett Michael is a pleasant 74 year old gentleman with a past medical history of advanced chronic obstructive pulmonary disease at baseline uses 4-6 L ofsupplemental oxygen via nasal cannula, resented to the emergency department on 09/03/2059 with complaints of worsening shortness of breath associate with productive cough. Symptoms felt to be secondary to infectious process precipitating COPD exacerbation. He was admitted to the stepdown unit where he was started on Solu-Medrol along with empiric antibiotic therapy with doxycycline. Patient showing clinical impression by the following morning.   Objective: Filed Vitals:   09/04/15 0906 09/04/15 1229  BP: 113/69   Pulse:    Temp: 97.9 F (36.6 C) 98.9 F (37.2 C)  Resp: 20     Intake/Output Summary (Last 24 hours) at 09/04/15 1518 Last data filed at 09/04/15 1229  Gross per 24 hour  Intake    483 ml  Output    450 ml  Net     33 ml   Filed Weights   09/03/15 2136  Weight: 92.987 kg (205 lb)    Exam:   General:  patient is awake alert, states feeling better   Cardiovascular: regular rate rhythm, tachycardic   Respiratory: diminished breath sounds bilaterally, has respiratory wheezes   Abdomen: soft nontender nondistended   Musculoskeletal:  no edema  Data Reviewed: Basic Metabolic Panel:  Recent Labs Lab 09/03/15 2143  NA 142  K 4.0  CL 102  CO2 29  GLUCOSE 183*  BUN 14  CREATININE 1.12  CALCIUM 9.5   Liver Function Tests: No results for input(s): AST, ALT, ALKPHOS, BILITOT, PROT, ALBUMIN in the last 168 hours. No results for input(s): LIPASE, AMYLASE in the last 168 hours. No results for input(s): AMMONIA in the last 168 hours. CBC:  Recent Labs Lab 09/03/15 2143  WBC 18.7*  NEUTROABS 16.5*  HGB 13.0  HCT 39.9  MCV 91.9  PLT 146*   Cardiac Enzymes: No results for input(s): CKTOTAL, CKMB, CKMBINDEX, TROPONINI in the last 168 hours. BNP (last 3  results)  Recent Labs  09/03/15 2156  BNP 77.9    ProBNP (last 3 results) No results for input(s): PROBNP in the last 8760 hours.  CBG:  Recent Labs Lab 09/04/15 0113 09/04/15 0454 09/04/15 0905 09/04/15 1227  GLUCAP 220* 269* 275* 301*    Recent Results (from the past 240 hour(s))  MRSA PCR Screening     Status: None   Collection Time: 09/04/15  1:00 AM  Result Value Ref Range Status   MRSA by PCR NEGATIVE NEGATIVE Final    Comment:        The GeneXpert MRSA Assay (FDA approved for NASAL specimens only), is one component of a comprehensive MRSA colonization surveillance program. It is not intended to diagnose MRSA infection nor to guide or monitor treatment for MRSA infections.   Culture, sputum-assessment     Status: None   Collection Time: 09/04/15  2:05 AM  Result Value Ref Range Status   Specimen Description EXPECTORATED SPUTUM  Final   Special Requests NONE  Final   Sputum evaluation   Final    THIS SPECIMEN IS ACCEPTABLE. RESPIRATORY CULTURE REPORT TO FOLLOW.   Report Status 09/04/2015 FINAL  Final     Studies: Dg Chest Port 1 View  09/03/2015  CLINICAL DATA:  [age] year old male with shortness of breath and chest tightness EXAM: PORTABLE CHEST 1 VIEW COMPARISON:  Radiograph dated 05/1915 FINDINGS: Single-view of the chest demonstrate bibasilar linear atelectatic changes/ scarring. No focal consolidation, pleural effusion, or pneumothorax. Stable cardiac silhouette. The osseous structures are grossly unremarkable. IMPRESSION: No active disease. Electronically Signed   By: Elgie CollardArash  Radparvar M.D.   On: 09/03/2015 21:54    Scheduled Meds: . amLODipine  10 mg Oral Daily  . antiseptic oral rinse  7 mL Mouth Rinse BID  . atorvastatin  10 mg Oral q1800  . beta carotene w/minerals  1 tablet Oral BID  . budesonide (PULMICORT) nebulizer solution  0.25 mg Nebulization BID  . doxycycline  100 mg Oral Q12H  . guaiFENesin  600 mg Oral BID  . insulin aspart  0-9  Units Subcutaneous 6 times per day  . insulin glargine  16 Units Subcutaneous QHS  . ipratropium-albuterol  3 mL Nebulization TID  . losartan  100 mg Oral Daily  . methylPREDNISolone (SOLU-MEDROL) injection  60 mg Intravenous 4 times per day  . rivaroxaban  20 mg Oral Q supper  . sertraline  100 mg Oral Daily  . sodium chloride  3 mL Intravenous Q12H  . tiotropium  18 mcg Inhalation Daily   Continuous Infusions: . albuterol 10 mg/hr (09/03/15 2141)  . albuterol 10 mg/hr (09/04/15 0348)    Principal Problem:   COPD exacerbation (HCC) Active Problems:   Essential hypertension   COPD (chronic obstructive pulmonary disease) (HCC)   Aortic stenosis due to bicuspid aortic valve   Paroxysmal atrial fibrillation (HCC)   Depression   Obstructive chronic bronchitis with exacerbation (HCC)    Time spent:     Jeralyn BennettZAMORA, Mishell Donalson  [facility] Pager [phone]. If 7PM-7AM, please contact night-coverage at [email or web address], password [email or web address] 09/04/2015, 3:18 PM  LOS: 1 day

## 2015-09-05 ENCOUNTER — Inpatient Hospital Stay (HOSPITAL_COMMUNITY): Payer: PPO

## 2015-09-05 DIAGNOSIS — J9621 Acute and chronic respiratory failure with hypoxia: Secondary | ICD-10-CM

## 2015-09-05 DIAGNOSIS — J189 Pneumonia, unspecified organism: Secondary | ICD-10-CM

## 2015-09-05 LAB — GLUCOSE, CAPILLARY
GLUCOSE-CAPILLARY: 173 mg/dL — AB (ref 65–99)
GLUCOSE-CAPILLARY: 232 mg/dL — AB (ref 65–99)
GLUCOSE-CAPILLARY: 245 mg/dL — AB (ref 65–99)
Glucose-Capillary: 175 mg/dL — ABNORMAL HIGH (ref 65–99)
Glucose-Capillary: 222 mg/dL — ABNORMAL HIGH (ref 65–99)
Glucose-Capillary: 298 mg/dL — ABNORMAL HIGH (ref 65–99)

## 2015-09-05 LAB — BASIC METABOLIC PANEL
ANION GAP: 10 (ref 5–15)
BUN: 30 mg/dL — ABNORMAL HIGH (ref 6–20)
CALCIUM: 9.3 mg/dL (ref 8.9–10.3)
CHLORIDE: 104 mmol/L (ref 101–111)
CO2: 27 mmol/L (ref 22–32)
Creatinine, Ser: 1.28 mg/dL — ABNORMAL HIGH (ref 0.61–1.24)
GFR calc non Af Amer: 53 mL/min — ABNORMAL LOW (ref >60)
Glucose, Bld: 186 mg/dL — ABNORMAL HIGH (ref 65–99)
Potassium: 4.3 mmol/L (ref 3.5–5.1)
SODIUM: 141 mmol/L (ref 135–145)

## 2015-09-05 LAB — CBC
HCT: 37.4 % — ABNORMAL LOW (ref 39.0–52.0)
HEMOGLOBIN: 12.3 g/dL — AB (ref 13.0–17.0)
MCH: 30 pg (ref 26.0–34.0)
MCHC: 32.9 g/dL (ref 30.0–36.0)
MCV: 91.2 fL (ref 78.0–100.0)
PLATELETS: 135 10*3/uL — AB (ref 150–400)
RBC: 4.1 MIL/uL — AB (ref 4.22–5.81)
RDW: 15.8 % — ABNORMAL HIGH (ref 11.5–15.5)
WBC: 19.7 10*3/uL — AB (ref 4.0–10.5)

## 2015-09-05 MED ORDER — LORAZEPAM 0.5 MG PO TABS
0.5000 mg | ORAL_TABLET | Freq: Once | ORAL | Status: AC
  Administered 2015-09-05: 0.5 mg via ORAL
  Filled 2015-09-05: qty 1

## 2015-09-05 MED ORDER — NICOTINE 21 MG/24HR TD PT24
21.0000 mg | MEDICATED_PATCH | Freq: Every day | TRANSDERMAL | Status: DC
  Administered 2015-09-05: 21 mg via TRANSDERMAL
  Filled 2015-09-05 (×4): qty 1

## 2015-09-05 MED ORDER — ZOLPIDEM TARTRATE 5 MG PO TABS
5.0000 mg | ORAL_TABLET | Freq: Once | ORAL | Status: AC
  Administered 2015-09-05: 5 mg via ORAL
  Filled 2015-09-05: qty 1

## 2015-09-05 NOTE — Progress Notes (Signed)
Utilization review completed. Erasto Sleight, RN, BSN. 

## 2015-09-05 NOTE — Discharge Instructions (Signed)

## 2015-09-05 NOTE — Progress Notes (Signed)
Inpatient Diabetes Program Recommendations  AACE/ADA: New Consensus Statement on Inpatient Glycemic Control (2015)  Target Ranges:  Prepandial:   less than 140 mg/dL      Peak postprandial:   less than 180 mg/dL (1-2 hours)      Critically ill patients:  140 - 180 mg/dL  Results for Brett Michael, Brett Michael (MRN [id number]) as of 09/05/2015 09:17  Ref. Range 09/04/2015 04:54 09/04/2015 09:05 09/04/2015 12:27 09/04/2015 15:49 09/04/2015 20:21 09/05/2015 00:23 09/05/2015 04:05 09/05/2015 08:55  Glucose-Capillary Latest Ref Range: 65-99 mg/dL 119269 (H) 147275 (H) 829301 (H) 259 (H) 264 (H) 175 (H) 173 (H) 232 (H)   Review of Glycemic Control   Current orders for Inpatient glycemic control: Lantus 16 units QHS, Novolog 0-9 units Q4H, Tradjenta 5 mg daily, Metformin 500 mg BID  Inpatient Diabetes Program Recommendations: Insulin - Basal: Please consider increasing Lantus to 20 units QHS. Correction (SSI): If patient is eating and tolerating diet, may want to consider changing frequency of CBGs and Novolog correction to ACHS. Insulin - Meal Coverage: If patient is eating at least 50% of meals, please consider ordering Novolog 4 units TID with meals for meal coverage.  Thanks, Orlando PennerMarie Britany Callicott, RN, MSN, CDE Diabetes Coordinator Inpatient Diabetes Program [phone] (Team Pager from 8am to 5pm) [phone] ([facility] office) [phone] [facility] office) [phone] [facility] office)

## 2015-09-05 NOTE — Progress Notes (Signed)
[facility] PROGRESS NOTE  JARAY BOLIVER ZOX:[id number] DOB: April 28, 1941 DOA: 09/03/2015 PCP: Tommy Rainwater, MD  Assessment/Plan: 1. COPD exacerbation -Patient with history of advanced COPD and chronic hypoxemic respiratory failure who requires 4-6 liters of supplemental oxygen at baseline, presented with worsening shortness of breath associate with cough. -Suspect that underlying infectious process precipitating COPD exacerbation.  -Showing slow improvement, he continues have de-saturations into the mid 80s with movement. Not quite at his baseline. -Will continue systemic steroids with Solu-Medrol 60 mg IV every 6 hours along with nebulizer treatments and empiric antibiotic therapy. Repeat chest x-ray did not show developing pneumonia.  2.  Possible community acquired pneumonia versus viral pneumonia -Patient reporting productive cough with green to yellow sputum production. -He present with COPD exacerbation -Will continue empiric antibiotic therapy with doxycycline. -Initial chest x-ray did not show acute consolidation -Repeat CXR on 09/05/2015 remained stable  3.  Acute on chronic hypoxemic respiratory failure -Evidenced by patient presented in respiratory distress, requiring a 100% nonrebreather having a respiratory rate of 25 on admission. -Secondary to COPD exacerbation -Will continue treating with IV steroids, empiric antibiotic therapy and DuoNeb's  4.  History of paroxysmal atrial fibrillation  -CHADSVasc score of 4 -Continue Xarelto -Telemetry showing episodes of sinus tachycardia, likely secondary to COPD exacerbation   5.   Type 2 diabetes mellitus. -Patient having elevated blood sugars in the 203 100 range, likely precipitated by systemic steroids -His blood sugars have improved with restarting Januvia 100 mg by mouth daily and metformin 500 mg by mouth twice a day, continue Lantus 16 units at bedtime.   Code Status: full code  Family  Communication: Spoke to his son is present at bedside Disposition Plan: Continue IV steroids, not ready for discharge   Antibiotics:  Doxy   HPI/Subjective: Brett Michael is a pleasant 74 year old gentleman with a past medical history of advanced chronic obstructive pulmonary disease at baseline uses 4-6 L ofsupplemental oxygen via nasal cannula, resented to the emergency department on 09/03/2059 with complaints of worsening shortness of breath associate with productive cough. Symptoms felt to be secondary to infectious process precipitating COPD exacerbation. He was admitted to the stepdown unit where he was started on Solu-Medrol along with empiric antibiotic therapy with doxycycline.    Objective: Filed Vitals:   09/04/15 2022 09/05/15 0434  BP: 133/66 114/96  Pulse: 94 91  Temp: 98.4 F (36.9 C) 98.3 F (36.8 C)  Resp: 20     Intake/Output Summary (Last 24 hours) at 09/05/15 0826 Last data filed at 09/04/15 2118  Gross per 24 hour  Intake      0 ml  Output    400 ml  Net   -400 ml   Filed Weights   09/03/15 2136  Weight: 92.987 kg (205 lb)    Exam:   General:  patient is awake alert, sat up for breakfast  Cardiovascular: regular rate rhythm, tachycardic   Respiratory: diminished breath sounds bilaterally, has bilateral expiratory wheezes, little change from yesterday's exam.    Abdomen: soft nontender nondistended   Musculoskeletal:  no edema  Data Reviewed: Basic Metabolic Panel:  Recent Labs Lab 09/03/15 2143 09/05/15 0343  NA 142 141  K 4.0 4.3  CL 102 104  CO2 29 27  GLUCOSE 183* 186*  BUN 14 30*  CREATININE 1.12 1.28*  CALCIUM 9.5 9.3   Liver Function Tests: No results for input(s): AST, ALT, ALKPHOS, BILITOT, PROT, ALBUMIN in the last 168 hours. No results for input(s): LIPASE,  AMYLASE in the last 168 hours. No results for input(s): AMMONIA in the last 168 hours. CBC:  Recent Labs Lab 09/03/15 2143 09/05/15 0343  WBC 18.7* 19.7*   NEUTROABS 16.5*  --   HGB 13.0 12.3*  HCT 39.9 37.4*  MCV 91.9 91.2  PLT 146* 135*   Cardiac Enzymes: No results for input(s): CKTOTAL, CKMB, CKMBINDEX, TROPONINI in the last 168 hours. BNP (last 3 results)  Recent Labs  09/03/15 2156  BNP 77.9    ProBNP (last 3 results) No results for input(s): PROBNP in the last 8760 hours.  CBG:  Recent Labs Lab 09/04/15 1227 09/04/15 1549 09/04/15 2021 09/05/15 0023 09/05/15 0405  GLUCAP 301* 259* 264* 175* 173*    Recent Results (from the past 240 hour(s))  MRSA PCR Screening     Status: None   Collection Time: 09/04/15  1:00 AM  Result Value Ref Range Status   MRSA by PCR NEGATIVE NEGATIVE Final    Comment:        The GeneXpert MRSA Assay (FDA approved for NASAL specimens only), is one component of a comprehensive MRSA colonization surveillance program. It is not intended to diagnose MRSA infection nor to guide or monitor treatment for MRSA infections.   Culture, sputum-assessment     Status: None   Collection Time: 09/04/15  2:05 AM  Result Value Ref Range Status   Specimen Description EXPECTORATED SPUTUM  Final   Special Requests NONE  Final   Sputum evaluation   Final    THIS SPECIMEN IS ACCEPTABLE. RESPIRATORY CULTURE REPORT TO FOLLOW.   Report Status 09/04/2015 FINAL  Final  Culture, respiratory (NON-Expectorated)     Status: None (Preliminary result)   Collection Time: 09/04/15  2:05 AM  Result Value Ref Range Status   Specimen Description EXPECTORATED SPUTUM  Final   Special Requests NONE  Final   Gram Stain   Final    ABUNDANT WBC PRESENT, PREDOMINANTLY PMN RARE SQUAMOUS EPITHELIAL CELLS PRESENT ABUNDANT GRAM NEGATIVE COCCOBACILLI FEW GRAM POSITIVE COCCI IN PAIRS    Culture   Final    Culture reincubated for better growth Performed at [facility]    Report Status PENDING  Incomplete     Studies: Dg Chest 2 View  09/05/2015  CLINICAL DATA:  Evaluate pneumonia. Shortness of breath,  cough and congestion. EXAM: CHEST  2 VIEW COMPARISON:  09/03/2015 FINDINGS: Normal heart size. There is aortic atherosclerosis noted. The lungs are hyperinflated there are coarsened interstitial markings of emphysema. Calcified granuloma in the left lower lobe is identified. No superimposed airspace consolidation identified. IMPRESSION: 1. Chronic lung disease compatible with COPD/emphysema. 2. Aortic atherosclerosis. Electronically Signed   By: Signa Kell M.D.   On: 09/05/2015 08:07   Dg Chest Port 1 View  09/03/2015  CLINICAL DATA:  74 year old male with shortness of breath and chest tightness EXAM: PORTABLE CHEST 1 VIEW COMPARISON:  Radiograph dated 05/1915 FINDINGS: Single-view of the chest demonstrate bibasilar linear atelectatic changes/ scarring. No focal consolidation, pleural effusion, or pneumothorax. Stable cardiac silhouette. The osseous structures are grossly unremarkable. IMPRESSION: No active disease. Electronically Signed   By: Elgie Collard M.D.   On: 09/03/2015 21:54    Scheduled Meds: . amLODipine  10 mg Oral Daily  . antiseptic oral rinse  7 mL Mouth Rinse BID  . atorvastatin  10 mg Oral q1800  . beta carotene w/minerals  1 tablet Oral BID  . budesonide (PULMICORT) nebulizer solution  0.25 mg Nebulization BID  . doxycycline  100 mg Oral Q12H  . guaiFENesin  600 mg Oral BID  . insulin aspart  0-9 Units Subcutaneous 6 times per day  . insulin glargine  16 Units Subcutaneous QHS  . ipratropium-albuterol  3 mL Nebulization TID  . linagliptin  5 mg Oral Daily  . losartan  100 mg Oral Daily  . metFORMIN  500 mg Oral BID WC  . methylPREDNISolone (SOLU-MEDROL) injection  60 mg Intravenous 4 times per day  . rivaroxaban  20 mg Oral Q supper  . sertraline  100 mg Oral Daily  . sodium chloride  3 mL Intravenous Q12H  . tiotropium  18 mcg Inhalation Daily   Continuous Infusions: . albuterol 10 mg/hr (09/03/15 2141)  . albuterol 10 mg/hr (09/04/15 0348)    Principal  Problem:   COPD exacerbation (HCC) Active Problems:   Essential hypertension   COPD (chronic obstructive pulmonary disease) (HCC)   Aortic stenosis due to bicuspid aortic valve   Paroxysmal atrial fibrillation (HCC)   Depression   Obstructive chronic bronchitis with exacerbation (HCC)    Time spent: 35 min    Jeralyn BennettZAMORA, Vallen Calabrese  [facility] Pager [phone]. If 7PM-7AM, please contact night-coverage at [email or web address], password [email or web address] 09/05/2015, 8:26 AM  LOS: 2 days

## 2015-09-06 DIAGNOSIS — J449 Chronic obstructive pulmonary disease, unspecified: Secondary | ICD-10-CM

## 2015-09-06 DIAGNOSIS — J441 Chronic obstructive pulmonary disease with (acute) exacerbation: Principal | ICD-10-CM

## 2015-09-06 DIAGNOSIS — I480 Paroxysmal atrial fibrillation: Secondary | ICD-10-CM

## 2015-09-06 DIAGNOSIS — F329 Major depressive disorder, single episode, unspecified: Secondary | ICD-10-CM

## 2015-09-06 DIAGNOSIS — I10 Essential (primary) hypertension: Secondary | ICD-10-CM

## 2015-09-06 LAB — GLUCOSE, CAPILLARY
GLUCOSE-CAPILLARY: 292 mg/dL — AB (ref 65–99)
GLUCOSE-CAPILLARY: 265 mg/dL — AB (ref 65–99)
Glucose-Capillary: 165 mg/dL — ABNORMAL HIGH (ref 65–99)
Glucose-Capillary: 222 mg/dL — ABNORMAL HIGH (ref 65–99)

## 2015-09-06 LAB — CBC
HEMATOCRIT: 36.4 % — AB (ref 39.0–52.0)
HEMOGLOBIN: 12 g/dL — AB (ref 13.0–17.0)
MCH: 29.8 pg (ref 26.0–34.0)
MCHC: 33 g/dL (ref 30.0–36.0)
MCV: 90.3 fL (ref 78.0–100.0)
Platelets: 145 10*3/uL — ABNORMAL LOW (ref 150–400)
RBC: 4.03 MIL/uL — AB (ref 4.22–5.81)
RDW: 15.5 % (ref 11.5–15.5)
WBC: 16.3 10*3/uL — AB (ref 4.0–10.5)

## 2015-09-06 LAB — BASIC METABOLIC PANEL
ANION GAP: 8 (ref 5–15)
BUN: 39 mg/dL — AB (ref 6–20)
CHLORIDE: 104 mmol/L (ref 101–111)
CO2: 27 mmol/L (ref 22–32)
Calcium: 9 mg/dL (ref 8.9–10.3)
Creatinine, Ser: 1.22 mg/dL (ref 0.61–1.24)
GFR calc Af Amer: >60 mL/min (ref >60)
GFR calc non Af Amer: 56 mL/min — ABNORMAL LOW (ref >60)
Glucose, Bld: 182 mg/dL — ABNORMAL HIGH (ref 65–99)
POTASSIUM: 4.1 mmol/L (ref 3.5–5.1)
SODIUM: 139 mmol/L (ref 135–145)

## 2015-09-06 MED ORDER — LORAZEPAM 1 MG PO TABS
1.0000 mg | ORAL_TABLET | Freq: Once | ORAL | Status: AC
  Administered 2015-09-06: 1 mg via ORAL
  Filled 2015-09-06: qty 1

## 2015-09-06 NOTE — Progress Notes (Signed)
Neb treatment given, family at bedside

## 2015-09-06 NOTE — Progress Notes (Signed)
[facility]                                                                              Patient Demographics  Brett Michael, is a 74 y.o. male, DOB - [birth year], ZOX:[id number]  Admit date - 09/03/2015   Admitting Physician Clydie Braun, MD  Outpatient Primary MD for the patient is Shamleffer, Landry Mellow, MD  LOS - 3   Chief Complaint  Patient presents with  . Shortness of Breath      HPI on 09/04/2015 by Dr. 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 Patient notes that symptoms initially started approximately 4 days ago after going to the grocery store. Describes cough as a productive cough that initially started out with whitish sputum that has changed in color to a more greenish yellow color. Sputum consistency is more thick. Patient reported shortness of breath that progressively worsened until today where he just worn out from doing any kind of activity. Tried using his inhalers without relief of breath symptoms. He reports associated symptoms of body aches, chest tightness, wheezing, and decreased appetite since Saturday. Up-to-date on his flu and pneumonia vaccines. Denies any recent sick contacts to his knowledge. Patient has a pulmonologist that he sees regularly but has not ever been referred to pulmonary rehabilitation. He reports never having symptoms like this requiring hospitalization in the past. Had a remote history of smoking, but notes only using smokeless chew tobacco at this time.   Assessment & Plan  COPD exacerbation -Patient has advanced COPD and chronic hypoxemic respiratory failure requires 4-6 L of supplemental oxygen at baseline -Patient feels his breathing is about baseline for  him, he continues to have productive cough -Question whether there is an infectious process precipitating his COPD exacerbation versus allergies, as patient does have a cat at home -Have recommended the patient has allergy testing once he is discharged -Continue steroids, nebulizer treatments, doxycycline, Spiriva -Will continue to monitor for an additional day as patient's air movement is very minimal  Possible community-acquired pneumonia  -Patient has had a productive cough with yellow to green colored sputum  -Continue doxycycline  -Chest x-ray 09/05/2015 showed chronic lung disease compatible COPD/emphysema   Acute on chronic hypoxemic respiratory failure -Patient did require 100% nonrebreather with a respiratory rate of 25 at admission -Appears improving. Likely secondary to COPD exacerbation -Continue treatment plan as above  History of paroxysmal atrial fibrillation -CHADSVASC 4 -Continue Xarelto   Type 2 diabetes mellitus  -Continue Januvia, metformin, Lantus  -Diabetes coordinator recommended increasing Lantus to 20 units  -Hyperglycemia likely secondary to systemic steroids   Hyperlipidemia -Continue statin  Essential hypertension -Continue amlodipine, losartan  Depression -Continue Zoloft  Code Status: Full  Family Communication: family at bedside. Son via phone.  Disposition Plan: admitted.   Time Spent in minutes   30 minutes  Procedures  None  Consults   None  DVT Prophylaxis  Xarelto  Lab Results  Component  Value Date   PLT 145* 09/06/2015    Medications  Scheduled Meds: . amLODipine  10 mg Oral Daily  . antiseptic oral rinse  7 mL Mouth Rinse BID  . atorvastatin  10 mg Oral q1800  . beta carotene w/minerals  1 tablet Oral BID  . budesonide (PULMICORT) nebulizer solution  0.25 mg Nebulization BID  . doxycycline  100 mg Oral Q12H  . guaiFENesin  600 mg Oral BID  . insulin aspart  0-9 Units Subcutaneous 6 times per day  . insulin  glargine  16 Units Subcutaneous QHS  . ipratropium-albuterol  3 mL Nebulization TID  . linagliptin  5 mg Oral Daily  . losartan  100 mg Oral Daily  . metFORMIN  500 mg Oral BID WC  . methylPREDNISolone (SOLU-MEDROL) injection  60 mg Intravenous 4 times per day  . nicotine  21 mg Transdermal Daily  . rivaroxaban  20 mg Oral Q supper  . sertraline  100 mg Oral Daily  . sodium chloride  3 mL Intravenous Q12H  . tiotropium  18 mcg Inhalation Daily   Continuous Infusions: . albuterol 10 mg/hr (09/03/15 2141)  . albuterol 10 mg/hr (09/04/15 0348)   PRN Meds:.albuterol, benzonatate, ondansetron **OR** ondansetron (ZOFRAN) IV  Antibiotics    Anti-infectives    Start     Dose/Rate Route Frequency Ordered Stop   09/04/15 1000  doxycycline (VIBRA-TABS) tablet 100 mg     100 mg Oral Every 12 hours 09/04/15 0121     09/03/15 2300  doxycycline (VIBRA-TABS) tablet 100 mg     100 mg Oral  Once 09/03/15 2255 09/03/15 2337      Subjective:   Brett Michael seen and examined today.   Patient feels that he is improving slowly. He continues to have productive cough. He denies any chest pain, abdominal pain, change in bowel pattern or urination.     Objective:   Filed Vitals:   09/05/15 1624 09/05/15 2025 09/06/15 0441 09/06/15 0836  BP:  146/78 157/76   Pulse:  96 85   Temp:  98.4 F (36.9 C) 98.2 F (36.8 C)   TempSrc:  Oral Oral   Resp:  18 20   Height:      Weight:      SpO2: 94% 97% 98% 95%    Wt Readings from Last 3 Encounters:  09/03/15 92.987 kg (205 lb)  08/04/15 92.625 kg (204 lb 3.2 oz)  06/24/15 93.441 kg (206 lb)     Intake/Output Summary (Last 24 hours) at 09/06/15 1145 Last data filed at 09/06/15 0441  Gross per 24 hour  Intake    840 ml  Output   1700 ml  Net   -860 ml    Exam  General: Well developed, well nourished, NAD, appears stated age  HEENT: NCAT,mucous membranes moist.   Cardiovascular: S1 S2 auscultated, no rubs, murmurs or gallops. Regular  rate and rhythm.  Respiratory: diminished breath sounds with mild expiratory wheezing, minimal air movement   Abdomen: Soft, nontender, nondistended, + bowel sounds  Extremities: warm dry without cyanosis clubbing or edema  Neuro: AAOx3, nonfocal  Psych: Normal affect and demeanor   Data Review   Micro Results Recent Results (from the past 240 hour(s))  MRSA PCR Screening     Status: None   Collection Time: 09/04/15  1:00 AM  Result Value Ref Range Status   MRSA by PCR NEGATIVE NEGATIVE Final    Comment:        The  GeneXpert MRSA Assay (FDA approved for NASAL specimens only), is one component of a comprehensive MRSA colonization surveillance program. It is not intended to diagnose MRSA infection nor to guide or monitor treatment for MRSA infections.   Culture, sputum-assessment     Status: None   Collection Time: 09/04/15  2:05 AM  Result Value Ref Range Status   Specimen Description EXPECTORATED SPUTUM  Final   Special Requests NONE  Final   Sputum evaluation   Final    THIS SPECIMEN IS ACCEPTABLE. RESPIRATORY CULTURE REPORT TO FOLLOW.   Report Status 09/04/2015 FINAL  Final  Culture, respiratory (NON-Expectorated)     Status: None (Preliminary result)   Collection Time: 09/04/15  2:05 AM  Result Value Ref Range Status   Specimen Description EXPECTORATED SPUTUM  Final   Special Requests NONE  Final   Gram Stain   Final    ABUNDANT WBC PRESENT, PREDOMINANTLY PMN RARE SQUAMOUS EPITHELIAL CELLS PRESENT ABUNDANT GRAM NEGATIVE COCCOBACILLI FEW GRAM POSITIVE COCCI IN PAIRS    Culture   Final    Culture reincubated for better growth Performed at [facility]    Report Status PENDING  Incomplete    Radiology Reports Ct Abdomen Pelvis W Wo Contrast  08/12/2015  CLINICAL DATA:  Right hydronephrosis on recent abdominal sonogram. EXAM: CT ABDOMEN AND PELVIS WITHOUT AND WITH CONTRAST TECHNIQUE: Multidetector CT imaging of the abdomen and pelvis was performed  following the standard protocol before and following the bolus administration of intravenous contrast. CONTRAST:  ISOVUE-300 IOPAMIDOL (ISOVUE-300) INJECTION 61% COMPARISON:  07/30/2015 abdominal sonogram. FINDINGS: Images are motion degraded. Lower chest: No significant gross pulmonary nodules or acute consolidative airspace disease. Aortic valvular calcifications are partially visualized. Hepatobiliary: Normal liver with no liver mass. There is a 1.1 cm calcified gallstone layering in the nondistended gallbladder, with no gallbladder wall thickening or pericholecystic fat stranding or fluid. No biliary ductal dilatation. Pancreas: Normal, with no mass or duct dilation. Spleen: Normal size. No mass. Adrenals/Urinary Tract: Normal adrenals. There are clustered 6 mm, 4 mm and 3 mm stones in the mid to upper lumbar segment of the right ureter. There is severe right hydroureteronephrosis to the level of the mid to lower lumbar segment of the right ureter. There is near complete right renal parenchymal atrophy. There is an exophytic hyperdense nonenhancing 1.6 cm lesion in the upper right kidney, in keeping with a Bosniak category 2 hemorrhagic/ proteinaceous renal cyst. There is an exophytic hyperdense nonenhancing 2.2 cm lesion in the lateral lower right kidney, in keeping with a Bosniak category 2 hemorrhagic/proteinaceous renal cyst. There is a delayed right contrast nephrogram, with no excretion of contrast into the right renal collecting system on the delayed sequence. No left nephrolithiasis. No left hydronephrosis. There is an exophytic 1.3 cm simple renal cyst in the lower left kidney. There is compensatory hypertrophy of the left kidney. Normal caliber left ureter with no left ureteral stones. On delayed imaging, there is no urothelial wall thickening and there are no filling defects in the opacified portions of the left collecting system or left ureter, noting non opacification of portions of the pelvic  segment of the left ureter, which limits evaluation in these locations. A tiny portion of the right superior bladder extends into the right inguinal hernia sac. Otherwise normal bladder, with no bladder wall thickening. Stomach/Bowel: Grossly normal stomach. There is a 5.2 x 3.4 cm duodenal diverticulum extending superiorly from the fourth portion of the duodenum. Normal caliber small bowel with  no small bowel wall thickening. The normal appendix partially extends into the right inguinal hernia sac. Marked sigmoid diverticulosis, with no large bowel wall thickening or pericolonic fat stranding. Vascular/Lymphatic: Atherosclerotic nonaneurysmal abdominal aorta. Patent portal, splenic, hepatic and renal veins. No pathologically enlarged lymph nodes in the abdomen or pelvis. Reproductive: Mild prostatomegaly, with nonspecific internal prostatic calcification. Other: No pneumoperitoneum, ascites or focal fluid collection. Musculoskeletal: No aggressive appearing focal osseous lesions. Moderate right inguinal hernia, which contains portions of the appendix and superior bladder. Diffuse osteopenia. Mild-to-moderate degenerative changes in the visualized thoracolumbar spine. IMPRESSION: 1. Severe right hydroureteronephrosis to the level of the mid to lower lumbar segment of the right ureter. Numerous clustered stones in the proximal right ureter. The right renal collecting system and right ureter remain unopacified on the delayed sequence due to right renal dysfunction, and as such an underlying urothelial lesion cannot be excluded, although the findings favor a chronic benign right ureteral stricture given the advanced right renal parenchymal atrophy and the absence of an obvious mass or wall thickening in the right ureter. 2. Two Bosniak category 2 hemorrhagic/proteinaceous nonenhancing renal cysts in the right kidney. 3. Compensatory hypertrophy of the left kidney, which contains a small simple renal cysts and is  otherwise normal. 4. Moderate right inguinal hernia containing portions of the appendix and right superior bladder. 5. Mild prostatomegaly. 6. Cholelithiasis. 7. Marked sigmoid diverticulosis. Electronically Signed   By: Delbert Phenix M.D.   On: 08/12/2015 17:11   Dg Chest 2 View  09/05/2015  CLINICAL DATA:  Evaluate pneumonia. Shortness of breath, cough and congestion. EXAM: CHEST  2 VIEW COMPARISON:  09/03/2015 FINDINGS: Normal heart size. There is aortic atherosclerosis noted. The lungs are hyperinflated there are coarsened interstitial markings of emphysema. Calcified granuloma in the left lower lobe is identified. No superimposed airspace consolidation identified. IMPRESSION: 1. Chronic lung disease compatible with COPD/emphysema. 2. Aortic atherosclerosis. Electronically Signed   By: Signa Kell M.D.   On: 09/05/2015 08:07   Dg Chest Port 1 View  09/03/2015  CLINICAL DATA:  74 year old male with shortness of breath and chest tightness EXAM: PORTABLE CHEST 1 VIEW COMPARISON:  Radiograph dated 05/1915 FINDINGS: Single-view of the chest demonstrate bibasilar linear atelectatic changes/ scarring. No focal consolidation, pleural effusion, or pneumothorax. Stable cardiac silhouette. The osseous structures are grossly unremarkable. IMPRESSION: No active disease. Electronically Signed   By: Elgie Collard M.D.   On: 09/03/2015 21:54    CBC  Recent Labs Lab 09/03/15 2143 09/05/15 0343 09/06/15 0312  WBC 18.7* 19.7* 16.3*  HGB 13.0 12.3* 12.0*  HCT 39.9 37.4* 36.4*  PLT 146* 135* 145*  MCV 91.9 91.2 90.3  MCH 30.0 30.0 29.8  MCHC 32.6 32.9 33.0  RDW 15.8* 15.8* 15.5  LYMPHSABS 1.0  --   --   MONOABS 1.0  --   --   EOSABS 0.1  --   --   BASOSABS 0.0  --   --     Chemistries   Recent Labs Lab 09/03/15 2143 09/05/15 0343 09/06/15 0312  NA 142 141 139  K 4.0 4.3 4.1  CL 102 104 104  CO2 GLUCOSE 183* 186* 182*  BUN 14 30* 39*  CREATININE 1.12 1.28* 1.22  CALCIUM 9.5  9.3 9.0   ------------------------------------------------------------------------------------------------------------------ estimated creatinine clearance is 53 mL/min (by C-G formula based on Cr of 1.22). ------------------------------------------------------------------------------------------------------------------ No results for input(s): HGBA1C in the last 72 hours. ------------------------------------------------------------------------------------------------------------------ No results for input(s): CHOL, HDL, LDLCALC, TRIG,  CHOLHDL, LDLDIRECT in the last 72 hours. ------------------------------------------------------------------------------------------------------------------ No results for input(s): TSH, T4TOTAL, T3FREE, THYROIDAB in the last 72 hours.  Invalid input(s): FREET3 ------------------------------------------------------------------------------------------------------------------ No results for input(s): VITAMINB12, FOLATE, FERRITIN, TIBC, IRON, RETICCTPCT in the last 72 hours.  Coagulation profile No results for input(s): INR, PROTIME in the last 168 hours.  No results for input(s): DDIMER in the last 72 hours.  Cardiac Enzymes No results for input(s): CKMB, TROPONINI, MYOGLOBIN in the last 168 hours.  Invalid input(s): CK ------------------------------------------------------------------------------------------------------------------ Invalid input(s): POCBNP    Kiera Hussey D.O. on 09/06/2015 at 11:45 AM  Between 7am to 7pm - Pager - [phone]  After 7pm go to [email or web address] - password [email or web address]  And look for the night coverage person covering for me after hours  [facility] Office  [phone]

## 2015-09-07 ENCOUNTER — Inpatient Hospital Stay (HOSPITAL_COMMUNITY): Payer: PPO

## 2015-09-07 LAB — CBC
HEMATOCRIT: 40.8 % (ref 39.0–52.0)
Hemoglobin: 13.4 g/dL (ref 13.0–17.0)
MCH: 29.7 pg (ref 26.0–34.0)
MCHC: 32.8 g/dL (ref 30.0–36.0)
MCV: 90.5 fL (ref 78.0–100.0)
PLATELETS: 168 10*3/uL (ref 150–400)
RBC: 4.51 MIL/uL (ref 4.22–5.81)
RDW: 15.2 % (ref 11.5–15.5)
WBC: 16.5 10*3/uL — AB (ref 4.0–10.5)

## 2015-09-07 LAB — GLUCOSE, CAPILLARY
GLUCOSE-CAPILLARY: 177 mg/dL — AB (ref 65–99)
Glucose-Capillary: 234 mg/dL — ABNORMAL HIGH (ref 65–99)
Glucose-Capillary: 210 mg/dL — ABNORMAL HIGH (ref 65–99)
Glucose-Capillary: 269 mg/dL — ABNORMAL HIGH (ref 65–99)
Glucose-Capillary: 236 mg/dL — ABNORMAL HIGH (ref 65–99)
Glucose-Capillary: 311 mg/dL — ABNORMAL HIGH (ref 65–99)
Glucose-Capillary: 228 mg/dL — ABNORMAL HIGH (ref 65–99)

## 2015-09-07 LAB — CULTURE, RESPIRATORY W GRAM STAIN

## 2015-09-07 MED ORDER — METHYLPREDNISOLONE SODIUM SUCC 125 MG IJ SOLR
60.0000 mg | Freq: Two times a day (BID) | INTRAMUSCULAR | Status: DC
  Administered 2015-09-07 – 2015-09-08 (×2): 60 mg via INTRAVENOUS
  Filled 2015-09-07 (×2): qty 2

## 2015-09-07 MED ORDER — LORAZEPAM 1 MG PO TABS
1.0000 mg | ORAL_TABLET | Freq: Once | ORAL | Status: AC
  Administered 2015-09-07: 1 mg via ORAL
  Filled 2015-09-07: qty 1

## 2015-09-07 NOTE — Progress Notes (Addendum)
[facility]                                                                              Patient Demographics  Brett HubertJoe Michael, is a 74 y.o. male, DOB - [birth year], UJW:[id number]  Admit date - 09/03/2015   Admitting Physician Clydie Braunondell A Smith, MD  Outpatient Primary MD for the patient is Shamleffer, Landry MellowIbethal JARALLA, MD  LOS - 4   Chief Complaint  Patient presents with  . Shortness of Breath      HPI on 09/04/2015 by Dr. 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 Patient notes that symptoms initially started approximately 4 days ago after going to the grocery store. Describes cough as a productive cough that initially started out with whitish sputum that has changed in color to a more greenish yellow color. Sputum consistency is more thick. Patient reported shortness of breath that progressively worsened until today where he just worn out from doing any kind of activity. Tried using his inhalers without relief of breath symptoms. He reports associated symptoms of body aches, chest tightness, wheezing, and decreased appetite since Saturday. Up-to-date on his flu and pneumonia vaccines. Denies any recent sick contacts to his knowledge. Patient has a pulmonologist that he sees regularly but has not ever been referred to pulmonary rehabilitation. He reports never having symptoms like this requiring hospitalization in the past. Had a remote history of smoking, but notes only using smokeless chew tobacco at this time.   Assessment & Plan  COPD exacerbation -Patient has advanced COPD and chronic hypoxemic respiratory failure requires 6 L of supplemental oxygen at baseline -Patient feels his breathing is about baseline for  him, he continues to have productive cough -Question whether there is an infectious process precipitating his COPD exacerbation versus allergies, as patient does have a cat at home -Have recommended the patient has allergy testing once he is discharged -Continue steroids, nebulizer treatments, doxycycline, Spiriva -Air movement slightly improving today  Possible community-acquired pneumonia  -Patient has had a productive cough with yellow to green colored sputum  -Continue doxycycline  -Chest x-ray 09/05/2015 showed chronic lung disease compatible COPD/emphysema  -CXR 09/07/2015: improved aeration  Acute on chronic hypoxemic respiratory failure -Patient did require 100% nonrebreather with a respiratory rate of 25 at admission -Appears improving. Likely secondary to COPD exacerbation -Continue treatment plan as above  History of paroxysmal atrial fibrillation -CHADSVASC 4 -Continue Xarelto   Type 2 diabetes mellitus  -Continue Januvia, metformin, Lantus  -Diabetes coordinator recommended increasing Lantus to 20 units  -Hyperglycemia likely secondary to systemic steroids   Hyperlipidemia -Continue statin  Essential hypertension -Continue amlodipine, losartan  Depression -Continue Zoloft  Code Status: Full  Family Communication: None at bedside  Disposition Plan: admitted.  Possible discharge within 24 hours.  Time Spent in minutes   30 minutes  Procedures  None  Consults   None  DVT Prophylaxis  Xarelto  Lab Results  Component Value Date  PLT 168 09/07/2015    Medications  Scheduled Meds: . amLODipine  10 mg Oral Daily  . antiseptic oral rinse  7 mL Mouth Rinse BID  . atorvastatin  10 mg Oral q1800  . beta carotene w/minerals  1 tablet Oral BID  . budesonide (PULMICORT) nebulizer solution  0.25 mg Nebulization BID  . doxycycline  100 mg Oral Q12H  . guaiFENesin  600 mg Oral BID  . insulin aspart  0-9 Units Subcutaneous 6 times per day  . insulin  glargine  16 Units Subcutaneous QHS  . ipratropium-albuterol  3 mL Nebulization TID  . linagliptin  5 mg Oral Daily  . losartan  100 mg Oral Daily  . metFORMIN  500 mg Oral BID WC  . methylPREDNISolone (SOLU-MEDROL) injection  60 mg Intravenous 4 times per day  . nicotine  21 mg Transdermal Daily  . rivaroxaban  20 mg Oral Q supper  . sertraline  100 mg Oral Daily  . sodium chloride  3 mL Intravenous Q12H  . tiotropium  18 mcg Inhalation Daily   Continuous Infusions: . albuterol 10 mg/hr (09/03/15 2141)  . albuterol 10 mg/hr (09/04/15 0348)   PRN Meds:.albuterol, benzonatate, ondansetron **OR** ondansetron (ZOFRAN) IV  Antibiotics    Anti-infectives    Start     Dose/Rate Route Frequency Ordered Stop   09/04/15 1000  doxycycline (VIBRA-TABS) tablet 100 mg     100 mg Oral Every 12 hours 09/04/15 0121     09/03/15 2300  doxycycline (VIBRA-TABS) tablet 100 mg     100 mg Oral  Once 09/03/15 2255 09/03/15 2337      Subjective:   Brett Michael seen and examined today.   Patient feels that his breathing has improved, but feels it is "tight".  He would like to feel better before going home. He continues to have productive cough. He denies any chest pain, dizziness, headache, abdominal pain, change in bowel pattern or urination.     Objective:   Filed Vitals:   09/06/15 2054 09/06/15 2104 09/07/15 0431 09/07/15 0853  BP:  129/65 154/81   Pulse:  88 83 86  Temp:  97.8 F (36.6 C) 98.1 F (36.7 C)   TempSrc:  Oral Oral   Resp:  20 21 20   Height:      Weight:      SpO2: 93% 96% 92% 94%    Wt Readings from Last 3 Encounters:  09/03/15 92.987 kg (205 lb)  08/04/15 92.625 kg (204 lb 3.2 oz)  06/24/15 93.441 kg (206 lb)     Intake/Output Summary (Last 24 hours) at 09/07/15 1028 Last data filed at 09/07/15 0432  Gross per 24 hour  Intake    360 ml  Output    550 ml  Net   -190 ml    Exam  General: Well developed, well nourished, NAD  HEENT: NCAT,mucous membranes  moist.   Cardiovascular: S1 S2 auscultated, RRR, 2/6SEM  Respiratory: diminished breath sounds with some expiratory wheezing, minimal air movement- slightly improved  Abdomen: Soft, nontender, nondistended, + bowel sounds  Extremities: warm dry without cyanosis clubbing or edema  Neuro: AAOx3, nonfocal  Psych: Normal affect and demeanor, pleasant  Data Review   Micro Results Recent Results (from the past 240 hour(s))  MRSA PCR Screening     Status: None   Collection Time: 09/04/15  1:00 AM  Result Value Ref Range Status   MRSA by PCR NEGATIVE NEGATIVE Final    Comment:  The GeneXpert MRSA Assay (FDA approved for NASAL specimens only), is one component of a comprehensive MRSA colonization surveillance program. It is not intended to diagnose MRSA infection nor to guide or monitor treatment for MRSA infections.   Culture, sputum-assessment     Status: None   Collection Time: 09/04/15  2:05 AM  Result Value Ref Range Status   Specimen Description EXPECTORATED SPUTUM  Final   Special Requests NONE  Final   Sputum evaluation   Final    THIS SPECIMEN IS ACCEPTABLE. RESPIRATORY CULTURE REPORT TO FOLLOW.   Report Status 09/04/2015 FINAL  Final  Culture, respiratory (NON-Expectorated)     Status: None (Preliminary result)   Collection Time: 09/04/15  2:05 AM  Result Value Ref Range Status   Specimen Description EXPECTORATED SPUTUM  Final   Special Requests NONE  Final   Gram Stain   Final    ABUNDANT WBC PRESENT, PREDOMINANTLY PMN RARE SQUAMOUS EPITHELIAL CELLS PRESENT ABUNDANT GRAM NEGATIVE COCCOBACILLI FEW GRAM POSITIVE COCCI IN PAIRS    Culture   Final    Culture reincubated for better growth Performed at [facility]    Report Status PENDING  Incomplete    Radiology Reports Ct Abdomen Pelvis W Wo Contrast  08/12/2015  CLINICAL DATA:  Right hydronephrosis on recent abdominal sonogram. EXAM: CT ABDOMEN AND PELVIS WITHOUT AND WITH CONTRAST  TECHNIQUE: Multidetector CT imaging of the abdomen and pelvis was performed following the standard protocol before and following the bolus administration of intravenous contrast. CONTRAST:  ISOVUE-300 IOPAMIDOL (ISOVUE-300) INJECTION 61% COMPARISON:  07/30/2015 abdominal sonogram. FINDINGS: Images are motion degraded. Lower chest: No significant gross pulmonary nodules or acute consolidative airspace disease. Aortic valvular calcifications are partially visualized. Hepatobiliary: Normal liver with no liver mass. There is a 1.1 cm calcified gallstone layering in the nondistended gallbladder, with no gallbladder wall thickening or pericholecystic fat stranding or fluid. No biliary ductal dilatation. Pancreas: Normal, with no mass or duct dilation. Spleen: Normal size. No mass. Adrenals/Urinary Tract: Normal adrenals. There are clustered 6 mm, 4 mm and 3 mm stones in the mid to upper lumbar segment of the right ureter. There is severe right hydroureteronephrosis to the level of the mid to lower lumbar segment of the right ureter. There is near complete right renal parenchymal atrophy. There is an exophytic hyperdense nonenhancing 1.6 cm lesion in the upper right kidney, in keeping with a Bosniak category 2 hemorrhagic/ proteinaceous renal cyst. There is an exophytic hyperdense nonenhancing 2.2 cm lesion in the lateral lower right kidney, in keeping with a Bosniak category 2 hemorrhagic/proteinaceous renal cyst. There is a delayed right contrast nephrogram, with no excretion of contrast into the right renal collecting system on the delayed sequence. No left nephrolithiasis. No left hydronephrosis. There is an exophytic 1.3 cm simple renal cyst in the lower left kidney. There is compensatory hypertrophy of the left kidney. Normal caliber left ureter with no left ureteral stones. On delayed imaging, there is no urothelial wall thickening and there are no filling defects in the opacified portions of the left  collecting system or left ureter, noting non opacification of portions of the pelvic segment of the left ureter, which limits evaluation in these locations. A tiny portion of the right superior bladder extends into the right inguinal hernia sac. Otherwise normal bladder, with no bladder wall thickening. Stomach/Bowel: Grossly normal stomach. There is a 5.2 x 3.4 cm duodenal diverticulum extending superiorly from the fourth portion of the duodenum. Normal caliber small bowel  with no small bowel wall thickening. The normal appendix partially extends into the right inguinal hernia sac. Marked sigmoid diverticulosis, with no large bowel wall thickening or pericolonic fat stranding. Vascular/Lymphatic: Atherosclerotic nonaneurysmal abdominal aorta. Patent portal, splenic, hepatic and renal veins. No pathologically enlarged lymph nodes in the abdomen or pelvis. Reproductive: Mild prostatomegaly, with nonspecific internal prostatic calcification. Other: No pneumoperitoneum, ascites or focal fluid collection. Musculoskeletal: No aggressive appearing focal osseous lesions. Moderate right inguinal hernia, which contains portions of the appendix and superior bladder. Diffuse osteopenia. Mild-to-moderate degenerative changes in the visualized thoracolumbar spine. IMPRESSION: 1. Severe right hydroureteronephrosis to the level of the mid to lower lumbar segment of the right ureter. Numerous clustered stones in the proximal right ureter. The right renal collecting system and right ureter remain unopacified on the delayed sequence due to right renal dysfunction, and as such an underlying urothelial lesion cannot be excluded, although the findings favor a chronic benign right ureteral stricture given the advanced right renal parenchymal atrophy and the absence of an obvious mass or wall thickening in the right ureter. 2. Two Bosniak category 2 hemorrhagic/proteinaceous nonenhancing renal cysts in the right kidney. 3. Compensatory  hypertrophy of the left kidney, which contains a small simple renal cysts and is otherwise normal. 4. Moderate right inguinal hernia containing portions of the appendix and right superior bladder. 5. Mild prostatomegaly. 6. Cholelithiasis. 7. Marked sigmoid diverticulosis. Electronically Signed   By: Delbert Phenix M.D.   On: 08/12/2015 17:11   Dg Chest 2 View  09/05/2015  CLINICAL DATA:  Evaluate pneumonia. Shortness of breath, cough and congestion. EXAM: CHEST  2 VIEW COMPARISON:  09/03/2015 FINDINGS: Normal heart size. There is aortic atherosclerosis noted. The lungs are hyperinflated there are coarsened interstitial markings of emphysema. Calcified granuloma in the left lower lobe is identified. No superimposed airspace consolidation identified. IMPRESSION: 1. Chronic lung disease compatible with COPD/emphysema. 2. Aortic atherosclerosis. Electronically Signed   By: Signa Kell M.D.   On: 09/05/2015 08:07   Dg Chest Port 1 View  09/07/2015  CLINICAL DATA:  Pt with SOB, cough since last night Was SOB during exam, especially after exertion Hx DM, COPD, HTN, aortic stenosis, paroxysmal atrial fibrillation, loop recorded implant 2015 EXAM: PORTABLE CHEST - 1 VIEW COMPARISON:  09/05/2015 FINDINGS: Coarse interstitial opacities in both lung bases right worse than left, slightly improved since previous exam. Calcified granuloma in the left mid lung as before. Implanted loop recorder overlies the cardiac silhouette. Heart size within normal limits for technique. Atheromatous aorta. No pneumothorax or effusion. Visualized skeletal structures are unremarkable. IMPRESSION: 1. Chronic bibasilar interstitial lung disease with marginally improved aeration. Electronically Signed   By: Corlis Leak M.D.   On: 09/07/2015 10:07   Dg Chest Port 1 View  09/03/2015  CLINICAL DATA:  74 year old male with shortness of breath and chest tightness EXAM: PORTABLE CHEST 1 VIEW COMPARISON:  Radiograph dated 05/1915 FINDINGS:  Single-view of the chest demonstrate bibasilar linear atelectatic changes/ scarring. No focal consolidation, pleural effusion, or pneumothorax. Stable cardiac silhouette. The osseous structures are grossly unremarkable. IMPRESSION: No active disease. Electronically Signed   By: Elgie Collard M.D.   On: 09/03/2015 21:54    CBC  Recent Labs Lab 09/03/15 2143 09/05/15 0343 09/06/15 0312 09/07/15 0750  WBC 18.7* 19.7* 16.3* 16.5*  HGB 13.0 12.3* 12.0* 13.4  HCT 39.9 37.4* 36.4* 40.8  PLT 146* 135* 145* 168  MCV 91.9 91.2 90.3 90.5  MCH 30.0 30.0 29.8 29.7  MCHC 32.6 32.9  33.0 32.8  RDW 15.8* 15.8* 15.5 15.2  LYMPHSABS 1.0  --   --   --   MONOABS 1.0  --   --   --   EOSABS 0.1  --   --   --   BASOSABS 0.0  --   --   --     Chemistries   Recent Labs Lab 09/03/15 2143 09/05/15 0343 09/06/15 0312  NA 142 141 139  K 4.0 4.3 4.1  CL 102 104 104  CO2 GLUCOSE 183* 186* 182*  BUN 14 30* 39*  CREATININE 1.12 1.28* 1.22  CALCIUM 9.5 9.3 9.0   ------------------------------------------------------------------------------------------------------------------ estimated creatinine clearance is 53 mL/min (by C-G formula based on Cr of 1.22). ------------------------------------------------------------------------------------------------------------------ No results for input(s): HGBA1C in the last 72 hours. ------------------------------------------------------------------------------------------------------------------ No results for input(s): CHOL, HDL, LDLCALC, TRIG, CHOLHDL, LDLDIRECT in the last 72 hours. ------------------------------------------------------------------------------------------------------------------ No results for input(s): TSH, T4TOTAL, T3FREE, THYROIDAB in the last 72 hours.  Invalid input(s): FREET3 ------------------------------------------------------------------------------------------------------------------ No results for input(s):  VITAMINB12, FOLATE, FERRITIN, TIBC, IRON, RETICCTPCT in the last 72 hours.  Coagulation profile No results for input(s): INR, PROTIME in the last 168 hours.  No results for input(s): DDIMER in the last 72 hours.  Cardiac Enzymes No results for input(s): CKMB, TROPONINI, MYOGLOBIN in the last 168 hours.  Invalid input(s): CK ------------------------------------------------------------------------------------------------------------------ Invalid input(s): POCBNP    Aldean Suddeth D.O. on 09/07/2015 at 10:28 AM  Between 7am to 7pm - Pager - [phone]  After 7pm go to [email or web address] - password [email or web address]  And look for the night coverage person covering for me after hours  [facility] Office  [phone]

## 2015-09-08 DIAGNOSIS — Q231 Congenital insufficiency of aortic valve: Secondary | ICD-10-CM

## 2015-09-08 DIAGNOSIS — I350 Nonrheumatic aortic (valve) stenosis: Secondary | ICD-10-CM

## 2015-09-08 LAB — BASIC METABOLIC PANEL
Anion gap: 8 (ref 5–15)
Anion gap: 8 (ref 5–15)
BUN: 37 mg/dL — AB (ref 6–20)
BUN: 38 mg/dL — AB (ref 6–20)
CALCIUM: 9.3 mg/dL (ref 8.9–10.3)
CHLORIDE: 101 mmol/L (ref 101–111)
CO2: 31 mmol/L (ref 22–32)
CO2: 28 mmol/L (ref 22–32)
CREATININE: 1.19 mg/dL (ref 0.61–1.24)
Calcium: 8.9 mg/dL (ref 8.9–10.3)
Chloride: 102 mmol/L (ref 101–111)
Creatinine, Ser: 1.1 mg/dL (ref 0.61–1.24)
GFR calc Af Amer: >60 mL/min (ref >60)
GFR calc non Af Amer: >60 mL/min (ref >60)
GFR calc non Af Amer: 58 mL/min — ABNORMAL LOW (ref >60)
GLUCOSE: 317 mg/dL — AB (ref 65–99)
Glucose, Bld: 222 mg/dL — ABNORMAL HIGH (ref 65–99)
POTASSIUM: 4.6 mmol/L (ref 3.5–5.1)
Potassium: 4.9 mmol/L (ref 3.5–5.1)
SODIUM: 140 mmol/L (ref 135–145)
Sodium: 138 mmol/L (ref 135–145)

## 2015-09-08 LAB — CBC
HCT: 39.8 % (ref 39.0–52.0)
HEMOGLOBIN: 12.8 g/dL — AB (ref 13.0–17.0)
MCH: 29.2 pg (ref 26.0–34.0)
MCHC: 32.2 g/dL (ref 30.0–36.0)
MCV: 90.7 fL (ref 78.0–100.0)
Platelets: 160 10*3/uL (ref 150–400)
RBC: 4.39 MIL/uL (ref 4.22–5.81)
RDW: 15 % (ref 11.5–15.5)
WBC: 15.2 10*3/uL — ABNORMAL HIGH (ref 4.0–10.5)

## 2015-09-08 LAB — GLUCOSE, CAPILLARY
GLUCOSE-CAPILLARY: 129 mg/dL — AB (ref 65–99)
GLUCOSE-CAPILLARY: 199 mg/dL — AB (ref 65–99)
GLUCOSE-CAPILLARY: 230 mg/dL — AB (ref 65–99)
Glucose-Capillary: 300 mg/dL — ABNORMAL HIGH (ref 65–99)
Glucose-Capillary: 328 mg/dL — ABNORMAL HIGH (ref 65–99)
Glucose-Capillary: 198 mg/dL — ABNORMAL HIGH (ref 65–99)

## 2015-09-08 MED ORDER — NICOTINE 21 MG/24HR TD PT24: 21.0000 mg | MEDICATED_PATCH | Freq: Every day | TRANSDERMAL | Status: AC

## 2015-09-08 MED ORDER — INSULIN GLARGINE 100 UNIT/ML ~~LOC~~ SOLN
4.0000 [IU] | Freq: Once | SUBCUTANEOUS | Status: AC
  Administered 2015-09-08: 4 [IU] via SUBCUTANEOUS
  Filled 2015-09-08: qty 0.04

## 2015-09-08 MED ORDER — PREDNISONE 10 MG PO TABS: ORAL_TABLET | ORAL | Status: DC

## 2015-09-08 MED ORDER — IPRATROPIUM-ALBUTEROL 0.5-2.5 (3) MG/3ML IN SOLN: 3.0000 mL | Freq: Three times a day (TID) | RESPIRATORY_TRACT | Status: AC

## 2015-09-08 MED ORDER — INSULIN ASPART 100 UNIT/ML ~~LOC~~ SOLN
10.0000 [IU] | Freq: Once | SUBCUTANEOUS | Status: AC
  Administered 2015-09-08: 10 [IU] via SUBCUTANEOUS

## 2015-09-08 MED ORDER — DOXYCYCLINE HYCLATE 100 MG PO TABS: 100.0000 mg | ORAL_TABLET | Freq: Two times a day (BID) | ORAL | Status: DC

## 2015-09-08 MED ORDER — ALBUTEROL SULFATE (2.5 MG/3ML) 0.083% IN NEBU: 2.5000 mg | INHALATION_SOLUTION | RESPIRATORY_TRACT | Status: AC | PRN

## 2015-09-08 MED ORDER — GUAIFENESIN ER 600 MG PO TB12: 600.0000 mg | ORAL_TABLET | Freq: Two times a day (BID) | ORAL | Status: AC

## 2015-09-08 MED ORDER — BENZONATATE 200 MG PO CAPS: 200.0000 mg | ORAL_CAPSULE | Freq: Two times a day (BID) | ORAL | Status: AC | PRN

## 2015-09-08 NOTE — Progress Notes (Signed)
Physical Therapy Treatment Patient Details Name: Brett Michael MRN: [id number] DOB: 1941-01-10 Today's Date: 09/08/2015    History of Present Illness Pt is a 74 y/o M w/ acute COPD exacerbation.  PMH includes CVA, aortic stenosis.    PT Comments    Progressing slowly with pulmonary improvement, but mobilizing well  Follow Up Recommendations  No PT follow up;Supervision/Assistance - 24 hour     Equipment Recommendations       Recommendations for Other Services       Precautions / Restrictions Precautions Precautions: Fall Precaution Comments: monitor O2    Mobility  Bed Mobility Overal bed mobility: Needs Assistance Bed Mobility: Supine to Sit     Supine to sit: Supervision     General bed mobility comments: HOB elevated, no verbal cues or physical assist required  Transfers Overall transfer level: Needs assistance Equipment used: None Transfers: Sit to/from Stand Sit to Stand: Min guard            Ambulation/Gait Ambulation/Gait assistance: Min guard Ambulation Distance (Feet): 100 Feet Assistive device: None (vs pulling portable O2) Gait Pattern/deviations: Step-through pattern     General Gait Details: generally steady, but SpO2 on 6L dropped to 79/81% at 93 bpm.. To/from bathroom with sats at 80/81% at 92bpm on 4L  On 4L with exercise in the bed  Sats dropped to 86/88% and 92 bpm.   Stairs            Wheelchair Mobility    Modified Rankin (Stroke Patients Only)       Balance Overall balance assessment: Needs assistance Sitting-balance support: No upper extremity supported Sitting balance-Leahy Scale: Good     Standing balance support: No upper extremity supported Standing balance-Leahy Scale: Fair                      Cognition Arousal/Alertness: Awake/alert Behavior During Therapy: WFL for tasks assessed/performed Overall Cognitive Status: Within Functional Limits for tasks assessed                       Exercises General Exercises - Lower Extremity Straight Leg Raises: AROM;Strengthening;10 reps;Supine Hip Flexion/Marching: AROM;Strengthening;Both;10 reps;Supine (graded resistance)    General Comments        Pertinent Vitals/Pain Pain Assessment: No/denies pain    Home Living                      Prior Function            PT Goals (current goals can now be found in the care plan section) Acute Rehab PT Goals Patient Stated Goal: to go home before Christmas PT Goal Formulation: With patient/family Time For Goal Achievement: 09/18/15 Potential to Achieve Goals: Good Progress towards PT goals: Progressing toward goals    Frequency  Min 3X/week    PT Plan Current plan remains appropriate    Co-evaluation             End of Session   Activity Tolerance: Patient tolerated treatment well;Patient limited by fatigue Patient left: in bed;with call bell/phone within reach;with family/visitor present     Time: [id number] PT Time Calculation (min) (ACUTE ONLY): 29 min  Charges:  $Gait Training: 8-22 mins $Therapeutic Activity: 8-22 mins                    G Codes:      Bobbyjo Marulanda, Eliseo GumKenneth V 09/08/2015, 4:08 PM  09/08/2015  [place] BingKen Danaisha Celli, PT  [phone]  (pager)

## 2015-09-08 NOTE — Care Management Note (Addendum)
Case Management Note Brett PieriniKristi Lindie Roberson RN, BSN Unit 2W-Case Manager [phone]  Patient Details  Name: Brett Michael MRN: [id number] Date of Birth: 05-11-1941  Subjective/Objective:  Admitted with COPD                  Action/Plan: PTA pt lived at home- has home 02- will need home nebulizer- order placed- [facility]  Expected Discharge Date:   09/08/15               Expected Discharge Plan:  Home/Self Care  In-House Referral:     Discharge planning Services  CM Consult  Post Acute Care Choice:  Durable Medical Equipment Choice offered to:  Patient  DME Arranged:  Nebulizer/meds DME Agency:  [facility]  [facility]     Status of Service:  Completed, signed off  Medicare Important Message Given:    Date Medicare IM Given:    Medicare IM give by:    Date Additional Medicare IM Given:    Additional Medicare Important Message give by:     If discussed at Long Length of Stay Meetings, dates discussed:    Additional Comments:  09/08/15- referral for COPD Gold protocol- pt with only one readmit in last 6 mo.- does not meet criteria for GOLD protocol.  Brett Michael, Brett Millhouse Hall, RN 09/08/2015, 3:04 PM

## 2015-09-08 NOTE — Discharge Summary (Signed)
Physician Discharge Summary  Brett Michael RUE:[id number] DOB: 1940/10/10 DOA: 09/03/2015  PCP: Tommy Rainwater, MD  Admit date: 09/03/2015 Discharge date: 09/08/2015  Time spent: 45 minutes  Recommendations for Outpatient Follow-up:  Patient will be discharged to home.  Patient will need to follow up with primary care provider within one week of discharge as well as pulmonologist, Dr. Delton Coombes.  Patient should continue medications as prescribed.  Patient should follow a hearth healthy/carb modified diet.   Discharge Diagnoses:  COPD exacerbation Possible community acquired pneumonia Acute on chronic hypoxemic respiratory failure History of proximal atrial fibrillation Type 2 diabetes mellitus Hyperlipidemia Essential hypertension Depression  Discharge Condition: Stable  Diet recommendation: Heart healthy/carb modified  Filed Weights   09/03/15 2136  Weight: 92.987 kg (205 lb)    History of present illness:  on 09/04/2015 by Dr. Madelyn Flavors Patient is a 74 year old male with a past medical history of COPD oxygen dependent on 6 L of [place] O2, diabetes mellitus type 2, depression, aortic stenosis 2/2 bicuspid aortic, proximal atrial fibrillation on Xarelto; who presents with complaints of progressively worsening cough and shortness of breath. Patient notes that symptoms initially started approximately 4 days ago after going to the grocery store. Describes cough as a productive cough that initially started out with whitish sputum that has changed in color to a more greenish yellow color. Sputum consistency is more thick. Patient reported shortness of breath that progressively worsened until today where he just worn out from doing any kind of activity. Tried using his inhalers without relief of breath symptoms. He reports associated symptoms of body aches, chest tightness, wheezing, and decreased appetite since Saturday. Up-to-date on his flu and pneumonia vaccines. Denies  any recent sick contacts to his knowledge. Patient has a pulmonologist that he sees regularly but has not ever been referred to pulmonary rehabilitation. He reports never having symptoms like this requiring hospitalization in the past. Had a remote history of smoking, but notes only using smokeless chew tobacco at this time.  Hospital Course:  COPD exacerbation -Patient has advanced COPD and chronic hypoxemic respiratory failure requires 4-6 L of supplemental oxygen at baseline -Patient feels his breathing is about baseline for him, he continues to have productive cough -Question whether there is an infectious process precipitating his COPD exacerbation versus allergies, as patient does have a cat at home -Have recommended the patient has allergy testing once he is discharged as well as close follow up with pulmonology -Continue steroids, nebulizer treatments, doxycycline, Spiriva  Possible community-acquired pneumonia  -Patient has had a productive cough with yellow to green colored sputum  -Continue doxycycline  -Chest x-ray 09/05/2015 showed chronic lung disease compatible COPD/emphysema  -CXR 09/07/2015: improved aeration  Acute on chronic hypoxemic respiratory failure -Patient did require 100% nonrebreather with a respiratory rate of 25 at admission -Appears improving. Likely secondary to COPD exacerbation -Continue treatment plan as above  History of paroxysmal atrial fibrillation -CHADSVASC 4 -Continue Xarelto   Type 2 diabetes mellitus complicated by hyperglycemia -Continue home medications at discharge -Hyperglycemia likely secondary to systemic steroids   Hyperlipidemia -Continue statin  Essential hypertension -Continue amlodipine, losartan  Depression -Continue Zoloft  Code status discussion -Discussed this with patient as well as her son, currently has healthcare power of attorney lives in [place]. Patient and son will try to switch healthcare power of attorney  to the son. -Currently Full code  Procedures  None  Consults  None  Discharge Exam: Filed Vitals:   09/08/15 0902 09/08/15 1304  BP:    Pulse: 72 74  Temp:    Resp: 20 20   Exam  General: Well developed, well nourished, NAD  HEENT: NCAT,mucous membranes moist.   Cardiovascular: S1 S2 auscultated, RRR, 2/6SEM  Respiratory: Diminished but clear, no wheezing or rhonchi noted  Abdomen: Soft, nontender, nondistended, + bowel sounds  Extremities: warm dry without cyanosis clubbing or edema  Neuro: AAOx3, nonfocal  Psych: Normal affect and demeanor, pleasant  Discharge Instructions      Discharge Instructions    Discharge instructions    Complete by:  As directed   Patient will be discharged to home.  Patient will need to follow up with primary care provider within one week of discharge as well as pulmonologist, Dr. Delton CoombesByrum.  Patient should continue medications as prescribed.  Patient should follow a hearth healthy/carb modified diet.            Medication List    TAKE these medications        AEROCHAMBER MINI CHAMBER Devi  Use as directed     albuterol 108 (90 BASE) MCG/ACT inhaler  Commonly known as:  PROVENTIL HFA;VENTOLIN HFA  Inhale 1 puff into the lungs every 6 (six) hours as needed for wheezing or shortness of breath.     albuterol (2.5 MG/3ML) 0.083% nebulizer solution  Commonly known as:  PROVENTIL  Take 3 mLs (2.5 mg total) by nebulization every 2 (two) hours as needed for wheezing or shortness of breath.     amLODipine 10 MG tablet  Commonly known as:  NORVASC  Take 10 mg by mouth daily.     atorvastatin 20 MG tablet  Commonly known as:  LIPITOR  Take 0.5 tablets (10 mg total) by mouth daily at 6 PM. KEEP OV.     benzonatate 200 MG capsule  Commonly known as:  TESSALON  Take 1 capsule (200 mg total) by mouth 2 (two) times daily as needed for cough.     doxycycline 100 MG tablet  Commonly known as:  VIBRA-TABS  Take 1 tablet (100 mg  total) by mouth every 12 (twelve) hours.     Fluticasone-Salmeterol 250-50 MCG/DOSE Aepb  Commonly known as:  ADVAIR  Inhale 1 puff into the lungs 2 (two) times daily.     guaiFENesin 600 MG 12 hr tablet  Commonly known as:  MUCINEX  Take 1 tablet (600 mg total) by mouth 2 (two) times daily.     insulin glargine 100 UNIT/ML injection  Commonly known as:  LANTUS  Inject 16 Units into the skin at bedtime.     INVOKANA 300 MG Tabs tablet  Generic drug:  canagliflozin  Take 300 mg by mouth daily before breakfast.     ipratropium-albuterol 0.5-2.5 (3) MG/3ML Soln  Commonly known as:  DUONEB  Take 3 mLs by nebulization 3 (three) times daily.     losartan 100 MG tablet  Commonly known as:  COZAAR  Take 100 mg by mouth daily.     metFORMIN 500 MG tablet  Commonly known as:  GLUCOPHAGE  Take 1 tablet (500 mg total) by mouth 2 (two) times daily with a meal.     MULTIVITAMIN PO  Take 1 tablet by mouth daily.     beta carotene w/minerals tablet  Take 1 tablet by mouth 2 (two) times daily.     nicotine 21 mg/24hr patch  Commonly known as:  NICODERM CQ - dosed in mg/24 hours  Place 1 patch (21 mg total) onto the skin  daily.     OXYGEN  Inhale 6 L into the lungs continuous.     predniSONE 10 MG tablet  Commonly known as:  DELTASONE  Take  (4 tabs) x 3 days, then taper to  (3 tabs) x 3 days, then  (2 tabs) x 3days, then  (1 tab) x 3days, then OFF.     rivaroxaban 20 MG Tabs tablet  Commonly known as:  XARELTO  Take 1 tablet (20 mg total) by mouth daily with supper.     sertraline 100 MG tablet  Commonly known as:  ZOLOFT  Take 100 mg by mouth daily.     sitaGLIPtin 100 MG tablet  Commonly known as:  JANUVIA  Take 100 mg by mouth daily.     tiotropium 18 MCG inhalation capsule  Commonly known as:  SPIRIVA  Place 18 mcg into inhaler and inhale daily.       Allergies  Allergen Reactions  . Symbicort [Budesonide-Formoterol Fumarate] Anaphylaxis and  Swelling    Tongue swells   Follow-up Information    Follow up with Shamleffer, Landry Mellow, MD. Schedule an appointment as soon as possible for a visit in 1 week.   Specialty:  Internal Medicine   Why:  Hospital follow up   Contact information:   [address]  [address] [place] [address] [phone]       Follow up with [name]., MD. Schedule an appointment as soon as possible for a visit in 1 week.   Specialty:  Pulmonary Disease   Why:  Hospital followup   Contact information:   [address] [place] [address] [phone]        The results of significant diagnostics from this hospitalization (including imaging, microbiology, ancillary and laboratory) are listed below for reference.    Significant Diagnostic Studies: Ct Abdomen Pelvis W Wo Contrast  08/12/2015  CLINICAL DATA:  Right hydronephrosis on recent abdominal sonogram. EXAM: CT ABDOMEN AND PELVIS WITHOUT AND WITH CONTRAST TECHNIQUE: Multidetector CT imaging of the abdomen and pelvis was performed following the standard protocol before and following the bolus administration of intravenous contrast. CONTRAST:  ISOVUE-300 IOPAMIDOL (ISOVUE-300) INJECTION 61% COMPARISON:  07/30/2015 abdominal sonogram. FINDINGS: Images are motion degraded. Lower chest: No significant gross pulmonary nodules or acute consolidative airspace disease. Aortic valvular calcifications are partially visualized. Hepatobiliary: Normal liver with no liver mass. There is a 1.1 cm calcified gallstone layering in the nondistended gallbladder, with no gallbladder wall thickening or pericholecystic fat stranding or fluid. No biliary ductal dilatation. Pancreas: Normal, with no mass or duct dilation. Spleen: Normal size. No mass. Adrenals/Urinary Tract: Normal adrenals. There are clustered 6 mm, 4 mm and 3 mm stones in the mid to upper lumbar segment of the right ureter. There is severe right hydroureteronephrosis to the level of  the mid to lower lumbar segment of the right ureter. There is near complete right renal parenchymal atrophy. There is an exophytic hyperdense nonenhancing 1.6 cm lesion in the upper right kidney, in keeping with a Bosniak category 2 hemorrhagic/ proteinaceous renal cyst. There is an exophytic hyperdense nonenhancing 2.2 cm lesion in the lateral lower right kidney, in keeping with a Bosniak category 2 hemorrhagic/proteinaceous renal cyst. There is a delayed right contrast nephrogram, with no excretion of contrast into the right renal collecting system on the delayed sequence. No left nephrolithiasis. No left hydronephrosis. There is an exophytic 1.3 cm simple renal cyst in the lower left kidney. There is compensatory hypertrophy  of the left kidney. Normal caliber left ureter with no left ureteral stones. On delayed imaging, there is no urothelial wall thickening and there are no filling defects in the opacified portions of the left collecting system or left ureter, noting non opacification of portions of the pelvic segment of the left ureter, which limits evaluation in these locations. A tiny portion of the right superior bladder extends into the right inguinal hernia sac. Otherwise normal bladder, with no bladder wall thickening. Stomach/Bowel: Grossly normal stomach. There is a 5.2 x 3.4 cm duodenal diverticulum extending superiorly from the fourth portion of the duodenum. Normal caliber small bowel with no small bowel wall thickening. The normal appendix partially extends into the right inguinal hernia sac. Marked sigmoid diverticulosis, with no large bowel wall thickening or pericolonic fat stranding. Vascular/Lymphatic: Atherosclerotic nonaneurysmal abdominal aorta. Patent portal, splenic, hepatic and renal veins. No pathologically enlarged lymph nodes in the abdomen or pelvis. Reproductive: Mild prostatomegaly, with nonspecific internal prostatic calcification. Other: No pneumoperitoneum, ascites or focal fluid  collection. Musculoskeletal: No aggressive appearing focal osseous lesions. Moderate right inguinal hernia, which contains portions of the appendix and superior bladder. Diffuse osteopenia. Mild-to-moderate degenerative changes in the visualized thoracolumbar spine. IMPRESSION: 1. Severe right hydroureteronephrosis to the level of the mid to lower lumbar segment of the right ureter. Numerous clustered stones in the proximal right ureter. The right renal collecting system and right ureter remain unopacified on the delayed sequence due to right renal dysfunction, and as such an underlying urothelial lesion cannot be excluded, although the findings favor a chronic benign right ureteral stricture given the advanced right renal parenchymal atrophy and the absence of an obvious mass or wall thickening in the right ureter. 2. Two Bosniak category 2 hemorrhagic/proteinaceous nonenhancing renal cysts in the right kidney. 3. Compensatory hypertrophy of the left kidney, which contains a small simple renal cysts and is otherwise normal. 4. Moderate right inguinal hernia containing portions of the appendix and right superior bladder. 5. Mild prostatomegaly. 6. Cholelithiasis. 7. Marked sigmoid diverticulosis. Electronically Signed   By: Delbert Phenix M.D.   On: 08/12/2015 17:11   Dg Chest 2 View  09/05/2015  CLINICAL DATA:  Evaluate pneumonia. Shortness of breath, cough and congestion. EXAM: CHEST  2 VIEW COMPARISON:  09/03/2015 FINDINGS: Normal heart size. There is aortic atherosclerosis noted. The lungs are hyperinflated there are coarsened interstitial markings of emphysema. Calcified granuloma in the left lower lobe is identified. No superimposed airspace consolidation identified. IMPRESSION: 1. Chronic lung disease compatible with COPD/emphysema. 2. Aortic atherosclerosis. Electronically Signed   By: Signa Kell M.D.   On: 09/05/2015 08:07   Dg Chest Port 1 View  09/07/2015  CLINICAL DATA:  Pt with SOB, cough  since last night Was SOB during exam, especially after exertion Hx DM, COPD, HTN, aortic stenosis, paroxysmal atrial fibrillation, loop recorded implant 2015 EXAM: PORTABLE CHEST - 1 VIEW COMPARISON:  09/05/2015 FINDINGS: Coarse interstitial opacities in both lung bases right worse than left, slightly improved since previous exam. Calcified granuloma in the left mid lung as before. Implanted loop recorder overlies the cardiac silhouette. Heart size within normal limits for technique. Atheromatous aorta. No pneumothorax or effusion. Visualized skeletal structures are unremarkable. IMPRESSION: 1. Chronic bibasilar interstitial lung disease with marginally improved aeration. Electronically Signed   By: Corlis Leak M.D.   On: 09/07/2015 10:07   Dg Chest Port 1 View  09/03/2015  CLINICAL DATA:  74 year old male with shortness of breath and chest tightness EXAM: PORTABLE CHEST  1 VIEW COMPARISON:  Radiograph dated 05/1915 FINDINGS: Single-view of the chest demonstrate bibasilar linear atelectatic changes/ scarring. No focal consolidation, pleural effusion, or pneumothorax. Stable cardiac silhouette. The osseous structures are grossly unremarkable. IMPRESSION: No active disease. Electronically Signed   By: Elgie Collard M.D.   On: 09/03/2015 21:54    Microbiology: Recent Results (from the past 240 hour(s))  MRSA PCR Screening     Status: None   Collection Time: 09/04/15  1:00 AM  Result Value Ref Range Status   MRSA by PCR NEGATIVE NEGATIVE Final    Comment:        The GeneXpert MRSA Assay (FDA approved for NASAL specimens only), is one component of a comprehensive MRSA colonization surveillance program. It is not intended to diagnose MRSA infection nor to guide or monitor treatment for MRSA infections.   Culture, sputum-assessment     Status: None   Collection Time: 09/04/15  2:05 AM  Result Value Ref Range Status   Specimen Description EXPECTORATED SPUTUM  Final   Special Requests NONE  Final    Sputum evaluation   Final    THIS SPECIMEN IS ACCEPTABLE. RESPIRATORY CULTURE REPORT TO FOLLOW.   Report Status 09/04/2015 FINAL  Final  Culture, respiratory (NON-Expectorated)     Status: None   Collection Time: 09/04/15  2:05 AM  Result Value Ref Range Status   Specimen Description EXPECTORATED SPUTUM  Final   Special Requests NONE  Final   Gram Stain   Final    ABUNDANT WBC PRESENT, PREDOMINANTLY PMN RARE SQUAMOUS EPITHELIAL CELLS PRESENT ABUNDANT GRAM NEGATIVE COCCOBACILLI FEW GRAM POSITIVE COCCI IN PAIRS    Culture   Final    ABUNDANT HAEMOPHILUS INFLUENZAE Note: BETA LACTAMASE POSITIVE Performed at [facility]    Report Status 09/07/2015 FINAL  Final     Labs: Basic Metabolic Panel:  Recent Labs Lab 09/03/15 2143 09/05/15 0343 09/06/15 0312 09/08/15 0540 09/08/15 1143  NA 142 141 139 140 138  K 4.0 4.3 4.1 4.6 4.9  CL 102 104 104 101 102  CO2 29 27 27 31 28   GLUCOSE 183* 186* 182* 222* 317*  BUN 14 30* 39* 37* 38*  CREATININE 1.12 1.28* 1.22 1.10 1.19  CALCIUM 9.5 9.3 9.0 8.9 9.3   Liver Function Tests: No results for input(s): AST, ALT, ALKPHOS, BILITOT, PROT, ALBUMIN in the last 168 hours. No results for input(s): LIPASE, AMYLASE in the last 168 hours. No results for input(s): AMMONIA in the last 168 hours. CBC:  Recent Labs Lab 09/03/15 2143 09/05/15 0343 09/06/15 0312 09/07/15 0750 09/08/15 0540  WBC 18.7* 19.7* 16.3* 16.5* 15.2*  NEUTROABS 16.5*  --   --   --   --   HGB 13.0 12.3* 12.0* 13.4 12.8*  HCT 39.9 37.4* 36.4* 40.8 39.8  MCV 91.9 91.2 90.3 90.5 90.7  PLT 146* 135* 145* 168 160   Cardiac Enzymes: No results for input(s): CKTOTAL, CKMB, CKMBINDEX, TROPONINI in the last 168 hours. BNP: BNP (last 3 results)  Recent Labs  09/03/15 2156  BNP 77.9    ProBNP (last 3 results) No results for input(s): PROBNP in the last 8760 hours.  CBG:  Recent Labs Lab 09/08/15 0328 09/08/15 0749 09/08/15 0955 09/08/15 1110  09/08/15 1400  GLUCAP 230* 199* 300* 328* 198*       Signed:  Edsel Petrin  [facility] 09/08/2015, 2:18 PM

## 2015-09-19 ENCOUNTER — Ambulatory Visit (INDEPENDENT_AMBULATORY_CARE_PROVIDER_SITE_OTHER): Payer: PPO | Admitting: *Deleted

## 2015-09-19 DIAGNOSIS — I639 Cerebral infarction, unspecified: Secondary | ICD-10-CM | POA: Diagnosis not present

## 2015-09-19 NOTE — Progress Notes (Signed)
Carelink Summary Report / Loop Recorder 

## 2015-09-25 ENCOUNTER — Other Ambulatory Visit: Payer: Self-pay | Admitting: *Deleted

## 2015-09-25 NOTE — Patient Outreach (Signed)
[facility]) Care Management  09/25/2015  Lucila MaineJoe A Sacca 04/19/1941 [id number]   Subjective: Telephone call to patient's home number, spoke with daughter-in-law Hall Busingracy Kever, states patient is currently sleeping and asked origin of RNCM's call.  Mrs. Buesing states patient is very hard of hearing and she takes care of all of patient's medical stuff.   RNCM advised Mrs. Maske, RNCM would need to get verbal authorization from patient prior to discussing nature of RNCM's call with anyone other than the patient.   Left HIPAA compliant voice mail message with patient's daughter-in-law for patient and requested call back.   Objective: Per Epic case review: Patient's discharge diagnosis from 09/03/15 - 12-26/16 hospitalization: respiratory failure, depression, hyperlipidemia, COPD exacerbation, diabetes, hypertension, and possible PNA.   Patient also oxygen dependent.  Patient to follow up with primary care MD, Dr. Lonzo CloudShamleffer and pulmonologist Dr. Delton CoombesByrum within 1 week of discharge from hospital.  [facility] Point of Care Recommendation report: patient has had 1 ED visit and 1 inpatient admit.   Patient was given flu vaccine on 06/24/15.  Patient's primary care MD is Dr. Harrington ChallengerIbethel Shamleffer.   Assessment: Referral received :09/25/15.   Referral date : 09/22/15.  [facility]  Per referral patient has advanced COPD and is currently on 4 -6 liters of oxygen.   Patient had recent hospitalization and discharged on 09/10/15.  [facility] source [facility].   Plan: RNCM will call patient within 3 business days for telephone screen completion, if no return call.  Ayde Record H. Gardiner Barefootooper RN, BSN, CCM [facility] Care Management [facility] Telephonic CM Phone: [phone] Fax: [phone]

## 2015-09-26 ENCOUNTER — Other Ambulatory Visit: Payer: Self-pay | Admitting: *Deleted

## 2015-09-26 NOTE — Patient Outreach (Signed)
[facility]) Care Management  09/26/2015  Lucila MaineJoe A Petit 05-Mar-1941 [id number]  Subjective: Telephone call to patient's home number, no answer, left HIPAA compliant voice mail message, and requested call back.    Objective: Per Epic case review: Patient's discharge diagnosis from 09/03/15 - 12-26/16 hospitalization: respiratory failure, depression, hyperlipidemia, COPD exacerbation, diabetes, hypertension, and possible PNA. Patient also oxygen dependent. Patient to follow up with primary care MD, Dr. Lonzo CloudShamleffer and pulmonologist Dr. Delton CoombesByrum within 1 week of discharge from hospital. Per [facility] Point of Care Recommendation report: patient has had 1 ED visit and 1 inpatient admit. Patient was given flu vaccine on 06/24/15. Patient's primary care MD is Dr. Harrington ChallengerIbethel Shamleffer.   Assessment: Referral received :09/25/15. Referral date : 09/22/15. [facility]. Per referral patient has advanced COPD and is currently on 4 -6 liters of oxygen. Patient had recent hospitalization and discharged on 09/10/15. Referral source [facility].   Plan: RNCM will call patient for 2nd telephonic outreach within 3 business days for telephone screen completion, if no return call.  Joshua Zeringue H. Gardiner Barefootooper RN, BSN, CCM [facility] Care Management [facility] Telephonic CM Phone: [phone]

## 2015-09-27 ENCOUNTER — Inpatient Hospital Stay (HOSPITAL_BASED_OUTPATIENT_CLINIC_OR_DEPARTMENT_OTHER)
Admission: EM | Admit: 2015-09-27 | Discharge: 2015-10-02 | DRG: 445 | Disposition: A | Discharge status: Home / Self Care | Payer: PPO | Attending: Internal Medicine | Admitting: Internal Medicine

## 2015-09-27 ENCOUNTER — Encounter (HOSPITAL_BASED_OUTPATIENT_CLINIC_OR_DEPARTMENT_OTHER): Payer: Self-pay | Admitting: Emergency Medicine

## 2015-09-27 ENCOUNTER — Emergency Department (HOSPITAL_BASED_OUTPATIENT_CLINIC_OR_DEPARTMENT_OTHER): Payer: PPO

## 2015-09-27 DIAGNOSIS — Z825 Family history of asthma and other chronic lower respiratory diseases: Secondary | ICD-10-CM | POA: Diagnosis not present

## 2015-09-27 DIAGNOSIS — Z9981 Dependence on supplemental oxygen: Secondary | ICD-10-CM | POA: Diagnosis not present

## 2015-09-27 DIAGNOSIS — E669 Obesity, unspecified: Secondary | ICD-10-CM | POA: Diagnosis present

## 2015-09-27 DIAGNOSIS — Z794 Long term (current) use of insulin: Secondary | ICD-10-CM

## 2015-09-27 DIAGNOSIS — Z8673 Personal history of transient ischemic attack (TIA), and cerebral infarction without residual deficits: Secondary | ICD-10-CM

## 2015-09-27 DIAGNOSIS — Z888 Allergy status to other drugs, medicaments and biological substances status: Secondary | ICD-10-CM | POA: Diagnosis not present

## 2015-09-27 DIAGNOSIS — F17200 Nicotine dependence, unspecified, uncomplicated: Secondary | ICD-10-CM | POA: Diagnosis present

## 2015-09-27 DIAGNOSIS — R6881 Early satiety: Secondary | ICD-10-CM | POA: Diagnosis present

## 2015-09-27 DIAGNOSIS — R140 Abdominal distension (gaseous): Secondary | ICD-10-CM | POA: Diagnosis present

## 2015-09-27 DIAGNOSIS — I10 Essential (primary) hypertension: Secondary | ICD-10-CM | POA: Diagnosis present

## 2015-09-27 DIAGNOSIS — J9611 Chronic respiratory failure with hypoxia: Secondary | ICD-10-CM | POA: Diagnosis not present

## 2015-09-27 DIAGNOSIS — N390 Urinary tract infection, site not specified: Secondary | ICD-10-CM | POA: Diagnosis present

## 2015-09-27 DIAGNOSIS — I253 Aneurysm of heart: Secondary | ICD-10-CM | POA: Diagnosis present

## 2015-09-27 DIAGNOSIS — E785 Hyperlipidemia, unspecified: Secondary | ICD-10-CM | POA: Diagnosis present

## 2015-09-27 DIAGNOSIS — Z7901 Long term (current) use of anticoagulants: Secondary | ICD-10-CM | POA: Diagnosis not present

## 2015-09-27 DIAGNOSIS — Z6830 Body mass index (BMI) 30.0-30.9, adult: Secondary | ICD-10-CM

## 2015-09-27 DIAGNOSIS — Z87442 Personal history of urinary calculi: Secondary | ICD-10-CM | POA: Diagnosis not present

## 2015-09-27 DIAGNOSIS — N202 Calculus of kidney with calculus of ureter: Secondary | ICD-10-CM | POA: Diagnosis present

## 2015-09-27 DIAGNOSIS — J432 Centrilobular emphysema: Secondary | ICD-10-CM | POA: Diagnosis not present

## 2015-09-27 DIAGNOSIS — I2510 Atherosclerotic heart disease of native coronary artery without angina pectoris: Secondary | ICD-10-CM | POA: Diagnosis present

## 2015-09-27 DIAGNOSIS — N200 Calculus of kidney: Secondary | ICD-10-CM | POA: Diagnosis present

## 2015-09-27 DIAGNOSIS — K8000 Calculus of gallbladder with acute cholecystitis without obstruction: Principal | ICD-10-CM | POA: Diagnosis present

## 2015-09-27 DIAGNOSIS — J449 Chronic obstructive pulmonary disease, unspecified: Secondary | ICD-10-CM | POA: Diagnosis present

## 2015-09-27 DIAGNOSIS — J441 Chronic obstructive pulmonary disease with (acute) exacerbation: Secondary | ICD-10-CM

## 2015-09-27 DIAGNOSIS — E119 Type 2 diabetes mellitus without complications: Secondary | ICD-10-CM | POA: Diagnosis present

## 2015-09-27 DIAGNOSIS — K311 Adult hypertrophic pyloric stenosis: Secondary | ICD-10-CM | POA: Diagnosis present

## 2015-09-27 DIAGNOSIS — Q2112 Patent foramen ovale: Secondary | ICD-10-CM

## 2015-09-27 DIAGNOSIS — I480 Paroxysmal atrial fibrillation: Secondary | ICD-10-CM | POA: Diagnosis present

## 2015-09-27 DIAGNOSIS — Z79899 Other long term (current) drug therapy: Secondary | ICD-10-CM | POA: Diagnosis not present

## 2015-09-27 DIAGNOSIS — Q231 Congenital insufficiency of aortic valve: Secondary | ICD-10-CM

## 2015-09-27 DIAGNOSIS — N1330 Unspecified hydronephrosis: Secondary | ICD-10-CM | POA: Diagnosis present

## 2015-09-27 DIAGNOSIS — K3189 Other diseases of stomach and duodenum: Secondary | ICD-10-CM | POA: Diagnosis not present

## 2015-09-27 DIAGNOSIS — D696 Thrombocytopenia, unspecified: Secondary | ICD-10-CM | POA: Diagnosis present

## 2015-09-27 DIAGNOSIS — K8020 Calculus of gallbladder without cholecystitis without obstruction: Secondary | ICD-10-CM

## 2015-09-27 DIAGNOSIS — Z7952 Long term (current) use of systemic steroids: Secondary | ICD-10-CM | POA: Diagnosis not present

## 2015-09-27 DIAGNOSIS — N131 Hydronephrosis with ureteral stricture, not elsewhere classified: Secondary | ICD-10-CM | POA: Diagnosis present

## 2015-09-27 DIAGNOSIS — F329 Major depressive disorder, single episode, unspecified: Secondary | ICD-10-CM | POA: Diagnosis present

## 2015-09-27 DIAGNOSIS — I639 Cerebral infarction, unspecified: Secondary | ICD-10-CM | POA: Diagnosis present

## 2015-09-27 DIAGNOSIS — Q211 Atrial septal defect: Secondary | ICD-10-CM

## 2015-09-27 DIAGNOSIS — Q2381 Bicuspid aortic valve: Secondary | ICD-10-CM

## 2015-09-27 DIAGNOSIS — Q230 Congenital stenosis of aortic valve: Secondary | ICD-10-CM

## 2015-09-27 DIAGNOSIS — K810 Acute cholecystitis: Secondary | ICD-10-CM | POA: Diagnosis not present

## 2015-09-27 DIAGNOSIS — Z01811 Encounter for preprocedural respiratory examination: Secondary | ICD-10-CM | POA: Diagnosis not present

## 2015-09-27 DIAGNOSIS — F32A Depression, unspecified: Secondary | ICD-10-CM | POA: Diagnosis present

## 2015-09-27 LAB — URINALYSIS, ROUTINE W REFLEX MICROSCOPIC
Bilirubin Urine: NEGATIVE
Glucose, UA: NEGATIVE mg/dL
Hgb urine dipstick: NEGATIVE
Ketones, ur: NEGATIVE mg/dL
NITRITE: NEGATIVE
Protein, ur: 30 mg/dL — AB
SPECIFIC GRAVITY, URINE: 1.021 (ref 1.005–1.030)
pH: 5.5 (ref 5.0–8.0)

## 2015-09-27 LAB — CBC WITH DIFFERENTIAL/PLATELET
BASOS ABS: 0 10*3/uL (ref 0.0–0.1)
Basophils Relative: 0 %
EOS PCT: 2 %
Eosinophils Absolute: 0.1 10*3/uL (ref 0.0–0.7)
HCT: 37.7 % — ABNORMAL LOW (ref 39.0–52.0)
HEMOGLOBIN: 12.1 g/dL — AB (ref 13.0–17.0)
LYMPHS PCT: 10 %
Lymphs Abs: 0.7 10*3/uL (ref 0.7–4.0)
MCH: 29.2 pg (ref 26.0–34.0)
MCHC: 32.1 g/dL (ref 30.0–36.0)
MCV: 91.1 fL (ref 78.0–100.0)
Monocytes Absolute: 0.4 10*3/uL (ref 0.1–1.0)
Monocytes Relative: 6 %
NEUTROS ABS: 6 10*3/uL (ref 1.7–7.7)
NEUTROS PCT: 83 %
PLATELETS: 105 10*3/uL — AB (ref 150–400)
RBC: 4.14 MIL/uL — AB (ref 4.22–5.81)
RDW: 16.2 % — ABNORMAL HIGH (ref 11.5–15.5)
WBC: 7.2 10*3/uL (ref 4.0–10.5)

## 2015-09-27 LAB — COMPREHENSIVE METABOLIC PANEL
ALK PHOS: 63 U/L (ref 38–126)
ALT: 23 U/L (ref 17–63)
AST: 13 U/L — AB (ref 15–41)
Albumin: 3.7 g/dL (ref 3.5–5.0)
Anion gap: 6 (ref 5–15)
BUN: 16 mg/dL (ref 6–20)
CHLORIDE: 102 mmol/L (ref 101–111)
CO2: 30 mmol/L (ref 22–32)
CREATININE: 0.73 mg/dL (ref 0.61–1.24)
Calcium: 8.7 mg/dL — ABNORMAL LOW (ref 8.9–10.3)
GFR calc Af Amer: >60 mL/min (ref >60)
Glucose, Bld: 168 mg/dL — ABNORMAL HIGH (ref 65–99)
Potassium: 4.3 mmol/L (ref 3.5–5.1)
SODIUM: 138 mmol/L (ref 135–145)
Total Bilirubin: 0.7 mg/dL (ref 0.3–1.2)
Total Protein: 6.7 g/dL (ref 6.5–8.1)

## 2015-09-27 LAB — TROPONIN I: Troponin I: <0.03 ng/mL (ref <0.031)

## 2015-09-27 LAB — URINE MICROSCOPIC-ADD ON: RBC / HPF: NONE SEEN RBC/hpf (ref 0–5)

## 2015-09-27 LAB — BRAIN NATRIURETIC PEPTIDE: B Natriuretic Peptide: 72.4 pg/mL (ref 0.0–100.0)

## 2015-09-27 LAB — LIPASE, BLOOD: Lipase: 25 U/L (ref 11–51)

## 2015-09-27 LAB — I-STAT CG4 LACTIC ACID, ED: Lactic Acid, Venous: 0.87 mmol/L (ref 0.5–2.0)

## 2015-09-27 MED ORDER — ALBUTEROL SULFATE (2.5 MG/3ML) 0.083% IN NEBU
5.0000 mg | INHALATION_SOLUTION | Freq: Once | RESPIRATORY_TRACT | Status: AC
Start: 2015-09-27 — End: 2015-09-27
  Administered 2015-09-27: 5 mg via RESPIRATORY_TRACT
  Filled 2015-09-27: qty 6

## 2015-09-27 MED ORDER — ONDANSETRON HCL 4 MG/2ML IJ SOLN
4.0000 mg | Freq: Once | INTRAMUSCULAR | Status: AC
  Administered 2015-09-27: 4 mg via INTRAVENOUS
  Filled 2015-09-27: qty 2

## 2015-09-27 MED ORDER — SODIUM CHLORIDE 0.9 % IV SOLN
INTRAVENOUS | Status: AC
  Administered 2015-09-27: 21:00:00 via INTRAVENOUS

## 2015-09-27 MED ORDER — DEXTROSE 5 % IV SOLN
1.0000 g | Freq: Once | INTRAVENOUS | Status: AC
  Administered 2015-09-27: 1 g via INTRAVENOUS

## 2015-09-27 MED ORDER — FENTANYL CITRATE (PF) 100 MCG/2ML IJ SOLN
50.0000 ug | Freq: Once | INTRAMUSCULAR | Status: AC
  Administered 2015-09-27: 50 ug via INTRAVENOUS
  Filled 2015-09-27: qty 2

## 2015-09-27 MED ORDER — METHYLPREDNISOLONE SODIUM SUCC 125 MG IJ SOLR
80.0000 mg | Freq: Once | INTRAMUSCULAR | Status: AC
  Administered 2015-09-27: 80 mg via INTRAVENOUS
  Filled 2015-09-27: qty 2

## 2015-09-27 MED ORDER — ALBUTEROL SULFATE (2.5 MG/3ML) 0.083% IN NEBU
5.0000 mg | INHALATION_SOLUTION | Freq: Once | RESPIRATORY_TRACT | Status: AC
  Administered 2015-09-27: 5 mg via RESPIRATORY_TRACT
  Filled 2015-09-27: qty 6

## 2015-09-27 MED ORDER — CEFTRIAXONE SODIUM 1 G IJ SOLR
INTRAMUSCULAR | Status: AC
  Filled 2015-09-27: qty 10

## 2015-09-27 NOTE — ED Notes (Signed)
[facility]ra [facility]

## 2015-09-27 NOTE — ED Provider Notes (Signed)
CSN: [id number]     Arrival date & time 09/27/15  1604 History  By signing my name below, I, Brett Michael, attest that this documentation has been prepared under the direction and in the presence of Brett Octave, Michael. Electronically Signed: [name], ED Scribe. 09/27/2015. 6:19 PM   Chief Complaint  Patient presents with  . abdominal distention    . Shortness of Breath   The history is provided by the patient and a relative. No language interpreter was used.   Brett Michael is a 75 y.o. male with PMHx of COPD on 6 L of home oxygen, paroxysmal a-fib on Xarelto with a loop recorder implant, DM, and HTN who presents to the Emergency Department complaining of SOB with onset 2 days ago. The patient's relative states that he slept sitting upright last night, had to go up on his home oxygen today, and appears to be "belly breathing".  A nebulizer treatment and his home albuterol, Spiriva, and Advair inhalers provided insufficient relief in SOB at home. Also complains of abdominal bloating, constant abdominal soreness with no modifying factors, and decreased appetite. Pt denies increase in his usual cough productive of yellow sputum, chest pain, fever, change in bowel pattern (last bowel movement was 3 hours ago and normal), nausea, and vomiting. No history of abdominal surgery.    Past Medical History  Diagnosis Date  . Diabetes mellitus without complication (HCC)   . COPD (chronic obstructive pulmonary disease) (HCC)   . Hypertension   . Renal disorder     kidney stones  . CVA (cerebral infarction)   . Aortic stenosis     moderate by echo 10/2013  . Paroxysmal atrial fibrillation [facility])    Past Surgical History  Procedure Laterality Date  . Tee without cardioversion N/A 10/23/2013    Procedure: TRANSESOPHAGEAL ECHOCARDIOGRAM (TEE);  Surgeon: Brett Nose, Michael;  Location: [facility] ENDOSCOPY;  Service: Cardiovascular;  Laterality: N/A;  . 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 COPD Father   . COPD Brother    Social History  Substance Use Topics  . Smoking status: Former Smoker -- 1.00 packs/day for 30 years    Types: Cigarettes    Quit date: 09/14/2003  . Smokeless tobacco: Current User    Types: Chew  . Alcohol Use: No    Review of Systems 10 Systems reviewed and all are negative for acute change except as noted in the HPI.  Allergies  Symbicort  Home Medications   Prior to Admission medications   Medication Sig Start Date End Date Taking? Authorizing Provider  amLODipine (NORVASC) 10 MG tablet Take 10 mg by mouth daily.   Yes Historical Provider, Michael  insulin glargine (LANTUS) 100 UNIT/ML injection Inject 16 Units into the skin at bedtime.    Yes Historical Provider, Michael  losartan (COZAAR) 100 MG tablet Take 100 mg by mouth daily.   Yes Historical Provider, Michael  metFORMIN (GLUCOPHAGE) 500 MG tablet Take 1 tablet (500 mg total) by mouth 2 (two) times daily with a meal. 10/23/13  Yes Brett Hartshorn, Michael  Multiple Vitamins-Minerals (MULTIVITAMIN PO) Take 1 tablet by mouth daily.   Yes Historical Provider, Michael  OXYGEN-HELIUM IN Inhale 6 L into the lungs continuous.    Yes Historical Provider, Michael  rivaroxaban (XARELTO) 20 MG TABS tablet Take 1 tablet (20 mg total) by mouth daily with  supper. 01/23/15  Yes Brett Michael  sertraline (ZOLOFT) 100 MG tablet Take 100 mg by mouth daily.   Yes Historical Provider, Michael  sitaGLIPtin (JANUVIA) 100 MG tablet Take 100 mg by mouth daily.   Yes Historical Provider, Michael  albuterol (PROVENTIL HFA;VENTOLIN HFA) 108 (90 BASE) MCG/ACT inhaler Inhale 1 puff into the lungs every 6 (six) hours as needed for wheezing or shortness of breath. 04/23/14   Brett Peer, Michael  albuterol (PROVENTIL) (2.5 MG/3ML) 0.083% nebulizer solution Take 3 mLs (2.5 mg total) by nebulization every 2 (two) hours as needed for wheezing or shortness of breath.  09/08/15   Brett Michael  atorvastatin (LIPITOR) 20 MG tablet Take 0.5 tablets (10 mg total) by mouth daily at 6 PM. KEEP OV. 03/18/15   Brett Nose, Michael  benzonatate (TESSALON) 200 MG capsule Take 1 capsule (200 mg total) by mouth 2 (two) times daily as needed for cough. 09/08/15   Brett Michael  beta carotene w/minerals (OCUVITE) tablet Take 1 tablet by mouth 2 (two) times daily.    Historical Provider, Michael  doxycycline (VIBRA-TABS) 100 MG tablet Take 1 tablet (100 mg total) by mouth every 12 (twelve) hours. 09/08/15   Brett Michael  Fluticasone-Salmeterol (ADVAIR) 250-50 MCG/DOSE AEPB Inhale 1 puff into the lungs 2 (two) times daily.    Historical Provider, Michael  guaiFENesin (MUCINEX) 600 MG 12 hr tablet Take 1 tablet (600 mg total) by mouth 2 (two) times daily. 09/08/15   Brett Michael  ipratropium-albuterol (DUONEB) 0.5-2.5 (3) MG/3ML SOLN Take 3 mLs by nebulization 3 (three) times daily. 09/08/15   Brett Michael  nicotine (NICODERM CQ - DOSED IN MG/24 HOURS) 21 mg/24hr patch Place 1 patch (21 mg total) onto the skin daily. 09/08/15   Brett Michael  predniSONE (DELTASONE) 10 MG tablet Take 40mg  (4 tabs) x 3 days, then taper to 30mg  (3 tabs) x 3 days, then 20mg  (2 tabs) x 3days, then 10mg  (1 tab) x 3days, then OFF. 09/08/15   Brett Sells Mikhail, Michael  Spacer/Aero-Holding Chambers (AEROCHAMBER MINI CHAMBER) DEVI Use as directed 10/31/13   Brett Peer, Michael  tiotropium (SPIRIVA) 18 MCG inhalation capsule Place 18 mcg into inhaler and inhale daily.    Historical Provider, Michael   BP 132/76 mmHg  Pulse 89  Temp(Src) 99 F (37.2 C) (Oral)  Resp 18  Ht 5\' 6"  (1.676 m)  Wt 190 lb (86.183 kg)  BMI 30.68 kg/m2  SpO2 92% Physical Exam  Constitutional: He is oriented to person, place, and time. He appears well-developed and well-nourished. No distress.  HENT:  Head: Normocephalic and atraumatic.  Mouth/Throat: Oropharynx is clear and moist. No oropharyngeal exudate.   Eyes: Conjunctivae and EOM are normal. Pupils are equal, round, and reactive to light.  Neck: Normal Michael of motion. Neck supple.  No meningismus.  Cardiovascular: Normal rate, regular rhythm, normal heart sounds and intact distal pulses.   No murmur heard. Pulmonary/Chest: No respiratory distress.  Increased work of breathing Speaking in short sentences  Decreased air exchange with scattered wheezing  Abdominal: Soft. He exhibits distension. There is tenderness (Diffuse). There is no rebound and no guarding.  Musculoskeletal: Normal Michael of motion. He exhibits no edema or tenderness.  Neurological: He is alert and oriented to person, place, and time. No cranial nerve deficit. He exhibits normal muscle tone. Coordination normal.  No ataxia on finger to Michael bilaterally. No pronator drift. 5/5 strength throughout. CN 2-12 intact.Equal grip strength.  Sensation intact.   Skin: Skin is warm.  Psychiatric: He has a normal mood and affect. His behavior is normal.  Nursing note and vitals reviewed.   ED Course  Procedures (including critical care time) COORDINATION OF CARE: 5:48 PM Discussed treatment plan which includes lab work, CT A/P without contrast, abdominal and chest XR, a breathing treatment, fentanyl, and Zofran with pt at bedside and pt agreed to plan.  Labs Review Labs Reviewed  CBC WITH DIFFERENTIAL/PLATELET - Abnormal; Notable for the following:    RBC 4.14 (*)    Hemoglobin 12.1 (*)    HCT 37.7 (*)    RDW 16.2 (*)    Platelets 105 (*)    All other components within normal limits  COMPREHENSIVE METABOLIC PANEL - Abnormal; Notable for the following:    Glucose, Bld 168 (*)    Calcium 8.7 (*)    AST 13 (*)    All other components within normal limits  URINALYSIS, ROUTINE W REFLEX MICROSCOPIC (NOT AT [facility]) - Abnormal; Notable for the following:    APPearance CLOUDY (*)    Protein, ur 30 (*)    Leukocytes, UA MODERATE (*)    All other components within normal limits   URINE MICROSCOPIC-ADD ON - Abnormal; Notable for the following:    Squamous Epithelial / LPF 0-5 (*)    Bacteria, UA MANY (*)    All other components within normal limits  URINE CULTURE  LIPASE, BLOOD  TROPONIN I  BRAIN NATRIURETIC PEPTIDE  I-STAT CG4 LACTIC ACID, ED  I-STAT CG4 LACTIC ACID, ED    Imaging Review Ct Abdomen Pelvis Wo Contrast  09/27/2015  CLINICAL DATA:  Abdominal distention for 2-3 days.  Pain. EXAM: CT ABDOMEN AND PELVIS WITHOUT CONTRAST TECHNIQUE: Multidetector CT imaging of the abdomen and pelvis was performed following the standard protocol without IV contrast. COMPARISON:  08/12/2015 FINDINGS: Lower chest: Mild bibasilar chronic interstitial disease. Normal heart size. Hepatobiliary: Normal liver. Cholelithiasis. No intrahepatic or extrahepatic biliary ductal dilatation. Pancreas: Normal. Spleen: Normal. Adrenals/Urinary Tract: Normal adrenal glands. Severe right hydronephrosis with severe right renal cortical thinning. Normal left kidney. There is a right ureteral calcification measuring 2.7 mm in transverse dimension and 2.8 cm in craniocaudal dimension. Decompressed bladder. Stomach/Bowel: No bowel wall thickening or dilatation. Fact containing right inguinal hernia. Diverticulosis without evidence of diverticulitis. No pneumatosis, pneumoperitoneum or portal venous gas. Small fat containing umbilical hernia. Vascular/Lymphatic: Normal caliber abdominal aorta. No lymphadenopathy. Other: No fluid collection or hematoma. Musculoskeletal: No lytic or sclerotic osseous lesion. 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 IMPRESSION: 1. Severe right hydronephrosis with severe right renal cortical thinning. There is a right mid ureteral calcification measuring 2.7 mm in transverse dimension and 2.8 cm in craniocaudal dimension. 2. Ankylosis of the posterior elements of the  thoracolumbar spine and syndesmophytes of the thoracolumbar spine as can be seen with ankylosing spondylitis. 3. Cholelithiasis. Electronically Signed   By: Elige KoHetal  Patel   On: 09/27/2015 18:58   Dg Abd Acute W/chest  09/27/2015  CLINICAL DATA:  Abdominal pain and bloating for several days. EXAM: DG ABDOMEN ACUTE W/ 1V CHEST COMPARISON:  None. FINDINGS: There is no evidence of free intraperitoneal air. There are dilated loops of small bowel in the left upper quadrant measuring up to 5 cm. There is air within the colon. No radiopaque calculi or other significant radiographic abnormality is seen. Heart size and mediastinal contours are  within normal limits. The lungs are hyperinflated likely secondary to COPD. IMPRESSION: Dilated loops of small bowel in the left upper quadrant measuring up to 5 cm with a air in the colon. This may reflect an ileus versus partial small bowel obstruction. Electronically Signed   By: Elige Ko   On: 09/27/2015 17:33   I have personally reviewed and evaluated these images and lab results as part of my medical decision-making.   EKG Interpretation None      MDM   Final diagnoses:  COPD exacerbation (HCC)  Kidney stone  Hydronephrosis, unspecified hydronephrosis type   patient with difficulty breathing and abdominal distention over the past several days. History of severe COPD. No vomiting.  X-ray shows no pneumonia but there are dilated small bowel loops concerning for ileus versus obstruction.  CT scan shows no bowel obstruction. There is severe right hydronephrosis which appears to be stable from November. There is a new right mid ureteral stone.   D/w Dr. Marlou Porch.  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 He does have a mid ureteral stone with possible UTI. He is not septic or toxic appearing. Dr. Marlou Porch agrees with urine culture, IV antibiotics hospitalist admission and he will evaluate.  Patient would benefit  from hospitalization for his COPD as well.  Continue nebulizers and steroids for COPD exacerbation. Antibiotics for UTI. Admission discussed with Dr. Clyde Lundborg who accepts patient to [facility].  Urology will consult.  Brett Octave, Michael 09/28/15 [phone]

## 2015-09-27 NOTE — Consult Note (Signed)
Patient known to urology, seen in December 2016 by Dr. Retta Dionesahlstedt for right sided hydronephrosis.  At that time, CT scan demonstrated an atrophic right kidney with stones along the proximal/mid ureter.  The patient was asymptomatic from a renal standpoint, was not having an renal colic symptoms.  The atrophic kidney is a reflection of long standing hydronephrosis, likely a result of stone surgery 6 or more years ago and inadequate follow-up.  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  Nothing to do from urologic prospective.  I will see patient first thing in the AM to re-evaluate his situation.

## 2015-09-27 NOTE — H&P (Signed)
[facility] History and Physical  Brett MaineJoe A Michael ZOX:[id number] DOB: 01-22-1941 DOA: 09/27/2015  Referring physician: ED physician PCP: Tommy RainwaterShamleffer, Ibethal JARALLA, MD  Specialists:   Chief Complaint: Abdominal bloating and increased urinary frequency  HPI: Brett MaineJoe A Subramanian is a 75 y.o. 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  Patient reports that he has been having abdominal bloating in the past 3 days. He does not have nausea, vomiting, diarrhea. No fever or chills. He has very minimal abdominal pain. He has chronic cough with little greenish sputum production. He has short breath, which is close to baseline. No chest pain. He reports an increased urinary frequency, but no dysuria or burning on urination. No unilateral weakness.   In ED, patient was found to have lactate 0.87, negative troponin, BNP 72.4, lipase 25, positive urinalysis for UTI, electrolytes and renal function okay. X-ray of acute abdomen/chest showed dilated loops of small bowel in the left upper quadrant measuring up to 5 cm with a air in the colon. CT-abd/pelvis showed no bowel dilation, but showed severe right hydronephrosis with severe right renal cortical thinning. There is a right mid ureteral calcification measuring 2.7 mm in transverse dimension and 2.8 cm in craniocaudal dimension and holelithiasis. These finding are not new, pt, which is similar with the findings on CT-abd/pelvis on 08/12/15. Urology was consulted by EDP, Dr. Marlou PorchHerrick will see pt in AM.  # CT abdomen/pelvis on 08/12/15 showed severe right hydroureteronephrosis to the level of the mid to lower lumbar segment of the right ureter. Numerous  clustered stones in the proximal right ureter. The right renal collecting system and right ureter remain unopacified on the delayed sequence due to right renal dysfunction, and as such an underlying urothelial lesion cannot be excluded, although the findings favor a chronic benign right ureteral stricture given the advanced right renal parenchymal atrophy and the absence of an obvious mass or wall thickening in the right ureter.  EKG: Not done in ED, will get one.   Where does patient live?   At home  Can patient participate in ADLs? Some   Review of Systems:   General: no fevers, chills, no changes in body weight, has poor appetite, has fatigue HEENT: no blurry vision, hearing changes or sore throat Pulm: has dyspnea, coughing, no wheezing CV: no chest pain, palpitations Abd: no nausea, vomiting, has abdominal bloating, no diarrhea, constipation GU: no dysuria, burning on urination, increased urinary frequency, hematuria  Ext: no leg edema Neuro: no unilateral weakness, numbness, or tingling, no vision change or hearing loss Skin: no rash MSK: No muscle spasm, no deformity, no limitation of range of movement in spin Heme: No easy bruising.  Travel history: No recent long distant travel.  Allergy:  Allergies  Allergen Reactions  . Symbicort [Budesonide-Formoterol Fumarate] Anaphylaxis and Swelling    Tongue swells    Past Medical History  Diagnosis Date  . Diabetes mellitus without complication (HCC)   . COPD (chronic obstructive pulmonary disease) (HCC)   . Hypertension   . Renal disorder     kidney stones  . CVA (cerebral infarction)   . Aortic stenosis     moderate by echo 10/2013  . Paroxysmal atrial fibrillation [facility](HCC)     Past Surgical History  Procedure  Laterality Date  . Tee without cardioversion N/A 10/23/2013    Procedure: TRANSESOPHAGEAL ECHOCARDIOGRAM (TEE);  Surgeon: [name], MD;  Location: [facility] ENDOSCOPY;  Service: Cardiovascular;  Laterality: N/A;  .  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 His smoking use included Cigarettes. He has a 30 pack-year smoking history. His smokeless tobacco use includes Chew. He reports that he does not drink alcohol. His drug history is not on file.  Family History:  Family History  Problem Relation Age of Onset  . COPD Father   . COPD Brother      Prior to Admission medications   Medication Sig Start Date End Date Taking? Authorizing Provider  amLODipine (NORVASC) 10 MG tablet Take 10 mg by mouth daily.   Yes Historical Provider, MD  insulin glargine (LANTUS) 100 UNIT/ML injection Inject 16 Units into the skin at bedtime.    Yes Historical Provider, MD  losartan (COZAAR) 100 MG tablet Take 100 mg by mouth daily.   Yes Historical Provider, MD  metFORMIN (GLUCOPHAGE) 500 MG tablet Take 1 tablet (500 mg total) by mouth 2 (two) times daily with a meal. 10/23/13  Yes [name], MD  Multiple Vitamins-Minerals (MULTIVITAMIN PO) Take 1 tablet by mouth daily.   Yes Historical Provider, MD  OXYGEN-HELIUM IN Inhale 6 L into the lungs continuous.    Yes Historical Provider, MD  rivaroxaban (XARELTO) 20 MG TABS tablet Take 1 tablet (20 mg total) by mouth daily with supper. 01/23/15  Yes [name], MD  sertraline (ZOLOFT) 100 MG tablet Take 100 mg by mouth daily.   Yes Historical Provider, MD  sitaGLIPtin (JANUVIA) 100 MG tablet Take 100 mg by mouth daily.   Yes Historical Provider, MD  albuterol (PROVENTIL HFA;VENTOLIN HFA) 108 (90 BASE) MCG/ACT inhaler Inhale 1 puff into the lungs every 6 (six) hours as needed for wheezing or shortness of breath. 04/23/14   [name], MD  albuterol (PROVENTIL) (2.5 MG/3ML) 0.083% nebulizer solution Take 3 mLs (2.5 mg total) by nebulization every 2 (two) hours as needed for wheezing or shortness of breath. 09/08/15   [name], DO  atorvastatin (LIPITOR)  20 MG tablet Take 0.5 tablets (10 mg total) by mouth daily at 6 PM. KEEP OV. 03/18/15   [name], MD  benzonatate (TESSALON) 200 MG capsule Take 1 capsule (200 mg total) by mouth 2 (two) times daily as needed for cough. 09/08/15   [name], DO  beta carotene w/minerals (OCUVITE) tablet Take 1 tablet by mouth 2 (two) times daily.    Historical Provider, MD  doxycycline (VIBRA-TABS) 100 MG tablet Take 1 tablet (100 mg total) by mouth every 12 (twelve) hours. 09/08/15   [name], DO  Fluticasone-Salmeterol (ADVAIR) 250-50 MCG/DOSE AEPB Inhale 1 puff into the lungs 2 (two) times daily.    Historical Provider, MD  guaiFENesin (MUCINEX) 600 MG 12 hr tablet Take 1 tablet (600 mg total) by mouth 2 (two) times daily. 09/08/15   [name], DO  ipratropium-albuterol (DUONEB) 0.5-2.5 (3) MG/3ML SOLN Take 3 mLs by nebulization 3 (three) times daily. 09/08/15   [name], DO  nicotine (NICODERM CQ - DOSED IN MG/24 HOURS) 21 mg/24hr patch Place 1 patch (21 mg total) onto the skin daily. 09/08/15   [name], DO  predniSONE (DELTASONE) 10 MG tablet Take 40mg  (4 tabs) x 3  days, then taper to 30mg  (3 tabs) x 3 days, then 20mg  (2 tabs) x 3days, then 10mg  (1 tab) x 3days, then OFF. 09/08/15   Edsel Petrin, DO  Spacer/Aero-Holding Chambers (AEROCHAMBER MINI CHAMBER) DEVI Use as directed 10/31/13   [name], MD  tiotropium (SPIRIVA) 18 MCG inhalation capsule Place 18 mcg into inhaler and inhale daily.    Historical Provider, MD    Physical Exam: Filed Vitals:   09/27/15 1843 09/27/15 1930 09/27/15 2000 09/27/15 2030  BP: 151/81 143/84 135/88 109/66  Pulse: 98 82 88 82  Temp:      TempSrc:      Resp: 29 19 18 23   Height:      Weight:      SpO2: 88% 92% 83% 94%   General: Not in acute distress HEENT:       Eyes: PERRL, EOMI, no scleral icterus.       ENT: No discharge from the ears and nose, no pharynx injection, no tonsillar enlargement.        Neck: No JVD, no  bruit, no mass felt. Heme: No neck lymph node enlargement. Cardiac: S1/S2, RRR, No murmurs, No gallops or rubs. Pulm: Decreased air movement bilaterally. No rales, wheezing, rhonchi or rubs. Abd: Soft, distended, nontender, no rebound pain, no organomegaly, BS present. Ext: No pitting leg edema bilaterally. 2+DP/PT pulse bilaterally. Musculoskeletal: No joint deformities, No joint redness or warmth, no limitation of ROM in spin. Skin: No rashes.  Neuro: Alert, oriented X3, cranial nerves II-XII grossly intact, muscle strength 5/5 in all extremities, sensation to light touch intact.  Psych: Patient is not psychotic, no suicidal or hemocidal ideation.  Labs on Admission:  Basic Metabolic Panel:  Recent Labs Lab 09/27/15 1803  NA 138  K 4.3  CL 102  CO2 30  GLUCOSE 168*  BUN 16  CREATININE 0.73  CALCIUM 8.7*   Liver Function Tests:  Recent Labs Lab 09/27/15 1803  AST 13*  ALT 23  ALKPHOS 63  BILITOT 0.7  PROT 6.7  ALBUMIN 3.7    Recent Labs Lab 09/27/15 1803  LIPASE 25   No results for input(s): AMMONIA in the last 168 hours. CBC:  Recent Labs Lab 09/27/15 1803  WBC 7.2  NEUTROABS 6.0  HGB 12.1*  HCT 37.7*  MCV 91.1  PLT 105*   Cardiac Enzymes:  Recent Labs Lab 09/27/15 1803  TROPONINI <0.03    BNP (last 3 results)  Recent Labs  09/03/15 2156 09/27/15 1803  BNP 77.9 72.4    ProBNP (last 3 results) No results for input(s): PROBNP in the last 8760 hours.  CBG: No results for input(s): GLUCAP in the last 168 hours.  Radiological Exams on Admission: Ct Abdomen Pelvis Wo Contrast  09/27/2015  CLINICAL DATA:  Abdominal distention for 2-3 days.  Pain. EXAM: CT ABDOMEN AND PELVIS WITHOUT CONTRAST TECHNIQUE: Multidetector CT imaging of the abdomen and pelvis was performed following the standard protocol without IV contrast. COMPARISON:  08/12/2015 FINDINGS: Lower chest: Mild bibasilar chronic interstitial disease. Normal heart size. Hepatobiliary:  Normal liver. Cholelithiasis. No intrahepatic or extrahepatic biliary ductal dilatation. Pancreas: Normal. Spleen: Normal. Adrenals/Urinary Tract: Normal adrenal glands. Severe right hydronephrosis with severe right renal cortical thinning. Normal left kidney. There is a right ureteral calcification measuring 2.7 mm in transverse dimension and 2.8 cm in craniocaudal dimension. Decompressed bladder. Stomach/Bowel: No bowel wall thickening or dilatation. Fact containing right inguinal hernia. Diverticulosis without evidence of diverticulitis. No pneumatosis, pneumoperitoneum or portal venous gas.  Small fat containing umbilical hernia. Vascular/Lymphatic: Normal caliber abdominal aorta. No lymphadenopathy. Other: No fluid collection or hematoma. Musculoskeletal: No lytic or sclerotic osseous lesion. 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 IMPRESSION: 1. Severe right hydronephrosis with severe right renal cortical thinning. There is a right mid ureteral calcification measuring 2.7 mm in transverse dimension and 2.8 cm in craniocaudal dimension. 2. 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 3. Cholelithiasis. Electronically Signed   By: Elige Ko   On: 09/27/2015 18:58   Dg Abd Acute W/chest  09/27/2015  CLINICAL DATA:  Abdominal pain and bloating for several days. EXAM: DG ABDOMEN ACUTE W/ 1V CHEST COMPARISON:  None. FINDINGS: There is no evidence of free intraperitoneal air. There are dilated loops of small bowel in the left upper quadrant measuring up to 5 cm. There is air within the colon. No radiopaque calculi or other significant radiographic abnormality is seen. Heart size and mediastinal contours are within normal limits. The lungs are hyperinflated likely secondary to COPD. IMPRESSION:  Dilated loops of small bowel in the left upper quadrant measuring up to 5 cm with a air in the colon. This may reflect an ileus versus partial small bowel obstruction. Electronically Signed   By: Elige Ko   On: 09/27/2015 17:33    Assessment/Plan Principal Problem:   Abdominal bloating Active Problems:   Stroke [facility])   Essential hypertension   Diabetes mellitus without complication (HCC)   COPD (chronic obstructive pulmonary disease) (HCC)   Tobacco use disorder   Aortic stenosis due to bicuspid aortic valve   Interatrial septal aneurysm with PFO   Paroxysmal atrial fibrillation (HCC)   Chronic respiratory failure with hypoxia (HCC)   Depression   HLD (hyperlipidemia)   Hydroureteronephrosis   Principal Problem:   Abdominal bloating Active Problems:   Stroke [facility])   Essential hypertension   Diabetes mellitus without complication (HCC)   COPD (chronic obstructive pulmonary disease) (HCC)   Tobacco use disorder   Aortic stenosis due to bicuspid aortic valve   Interatrial septal aneurysm with PFO   Paroxysmal atrial fibrillation (HCC)   Chronic respiratory failure with hypoxia (HCC)   Depression   HLD (hyperlipidemia)   Hydroureteronephrosis  Abdominal bloating and severe right hydronephrosis : Etiology for abdominal bloating is not clear. Lipase is negative. CT abdomen/pelvis has no bowel obstruction, but showed old severe right hydronephrosis with severe right renal cortical thinning and right mid ureteral calcification. Renal function normal. Urology was consulted by EDP, Dr. Marlou Porch will see pt in AM. -IVF: NS 100 cc/h -Zofran for nausea  UTI:  -rocephin IV -f/u Bx and Ux  COPD:: Patient is oxygen dependent and wears 6 L nasal cannula oxygen at baseline. He does not seem to have acute exacerbation. X-ray of acute abd/chest did not show infiltration -Advair inhaler -DuoNeb nebulizer, when necessary albuterol nebulizer - Mucinex of a cough  Paroxysmal atrial  fibrillation on Xarelto: Heart rates <110's. chadvasc score, 4 currently on anticoagulation of Xarelto. -Hold Xarelto in case pt need procedure  Diabetes mellitus type 2: Last hemoglobin A1c on record was noted to be 6.7 in 10/22/2013. Patient is taken Januvia, metformin and Lantus - Continue Lantus, decrease Lantus dosing from 16 to 11 units daily - SSI  Hypertension - Continue amlodipine and losartan  Thrombocytopenia: Platelet count noted to be  106, no bleeding tendency. - Continue to monitor by CBC  Depression: Stable, no suicidal or homicidal ideations. -Continue home medications: Zoloft  Tobacco abuse: -Did counseling about importance of quitting smoking -Nicotine patch  HLD: Last LDL was 110 and 10/22/13 -Continue home medications: lipitor  DVT ppx: SCD  Code Status: Full code Family Communication: None at bed side.  Disposition Plan: Admit to inpatient   Date of Service 09/27/2015    Lorretta Harp [facility] Pager [phone]  If 7PM-7AM, please contact night-coverage [email or web address] Password [facility] 09/27/2015, 8:57 PM

## 2015-09-27 NOTE — ED Notes (Signed)
Pt reports abdominal bloating for several days, reports severe pain all night, pt with long history of abdominal problems, pt states he feels so bloated and tight that he can not get a full breath. Pt reports large bm this am

## 2015-09-27 NOTE — ED Notes (Signed)
Assumed care of patient at this time from [place] RN. [facility] ETA 0145. Call bell within reach. Pt reports increased SHOB, lungs diminished - EDP at bedside, neb treatment given. Pt on 6lpm Nasal Canula - humidified.

## 2015-09-27 NOTE — Progress Notes (Signed)
[facility] Dr[facility] Manus Gunningancour  75 year old man with a history of COPD oxygen dependent on 6 L of [place] O2, diabetes mellitus type 2, depression, aortic stenosis 2/2 bicuspid aortic, proximal atrial fibrillation on Xarelto; hyperlipidemia, hypertension, depression, stroke, right ureteral stone, hx of severe right hydroureteronephrosis, who presents with abdominal bloating for 3 days.  Patient was found to have lactate 0.87, negative troponin, BNP 72.4, lipase 25, positive urinalysis for UTI, electrolytes and renal function okay. X-ray of acute abdomen/chest showed dilated loops of small bowel in the left upper quadrant measuring up to 5 cm with a air in the colon. CT-abd/pelvis showed no bowel dilation, but showed severe right hydronephrosis with severe right renal cortical thinning. There is a right mid ureteral calcification measuring 2.7 mm in transverse dimension and 2.8 cm in craniocaudal dimension and holelithiasis. These finding are not new, pt, which is similar with the findings on CT-abd/pelvis on 08/12/15. Urology was consulted by EDP, Dr. Marlou PorchHerrick will see pt in AM.  Patient is accepted to telemetry bed.  # CT abdomen/pelvis on 08/12/15 showed severe right hydroureteronephrosis to the level of the mid to lower lumbar segment of the right ureter. Numerous clustered stones in the proximal right ureter. The right renal collecting system and right ureter remain unopacified on the delayed sequence due to right renal dysfunction, and as such an underlying urothelial lesion cannot be excluded, although the findings favor a chronic benign right ureteral stricture given the advanced right renal parenchymal atrophy and the absence of an obvious mass or wall thickening in the right ureter.  Lorretta HarpXilin Vlada Uriostegui, MD  [facility] Pager [phone]  If 7PM-7AM, please contact night-coverage [email or web address] Password [email or web address] 09/27/2015, 9:56 PM

## 2015-09-28 DIAGNOSIS — I480 Paroxysmal atrial fibrillation: Secondary | ICD-10-CM

## 2015-09-28 DIAGNOSIS — N200 Calculus of kidney: Secondary | ICD-10-CM | POA: Diagnosis present

## 2015-09-28 DIAGNOSIS — E119 Type 2 diabetes mellitus without complications: Secondary | ICD-10-CM

## 2015-09-28 DIAGNOSIS — I10 Essential (primary) hypertension: Secondary | ICD-10-CM

## 2015-09-28 DIAGNOSIS — J449 Chronic obstructive pulmonary disease, unspecified: Secondary | ICD-10-CM

## 2015-09-28 DIAGNOSIS — R140 Abdominal distension (gaseous): Secondary | ICD-10-CM

## 2015-09-28 LAB — GLUCOSE, CAPILLARY
GLUCOSE-CAPILLARY: 191 mg/dL — AB (ref 65–99)
GLUCOSE-CAPILLARY: 165 mg/dL — AB (ref 65–99)
Glucose-Capillary: 236 mg/dL — ABNORMAL HIGH (ref 65–99)
Glucose-Capillary: 214 mg/dL — ABNORMAL HIGH (ref 65–99)

## 2015-09-28 LAB — BASIC METABOLIC PANEL
Anion gap: 10 (ref 5–15)
BUN: 15 mg/dL (ref 6–20)
CHLORIDE: 99 mmol/L — AB (ref 101–111)
CO2: 31 mmol/L (ref 22–32)
CREATININE: 0.89 mg/dL (ref 0.61–1.24)
Calcium: 8.5 mg/dL — ABNORMAL LOW (ref 8.9–10.3)
GFR calc Af Amer: >60 mL/min (ref >60)
GFR calc non Af Amer: >60 mL/min (ref >60)
GLUCOSE: 302 mg/dL — AB (ref 65–99)
POTASSIUM: 4.6 mmol/L (ref 3.5–5.1)
Sodium: 140 mmol/L (ref 135–145)

## 2015-09-28 LAB — HEPATIC FUNCTION PANEL
ALT: 20 U/L (ref 17–63)
AST: 12 U/L — AB (ref 15–41)
Albumin: 3.5 g/dL (ref 3.5–5.0)
Alkaline Phosphatase: 61 U/L (ref 38–126)
BILIRUBIN TOTAL: 0.7 mg/dL (ref 0.3–1.2)
Total Protein: 6.3 g/dL — ABNORMAL LOW (ref 6.5–8.1)

## 2015-09-28 LAB — PROTIME-INR
INR: 1.06 (ref 0.00–1.49)
Prothrombin Time: 14 seconds (ref 11.6–15.2)

## 2015-09-28 LAB — CBC
HEMATOCRIT: 35.7 % — AB (ref 39.0–52.0)
Hemoglobin: 11.5 g/dL — ABNORMAL LOW (ref 13.0–17.0)
MCH: 29.6 pg (ref 26.0–34.0)
MCHC: 32.2 g/dL (ref 30.0–36.0)
MCV: 92 fL (ref 78.0–100.0)
Platelets: 100 10*3/uL — ABNORMAL LOW (ref 150–400)
RBC: 3.88 MIL/uL — ABNORMAL LOW (ref 4.22–5.81)
RDW: 15.9 % — AB (ref 11.5–15.5)
WBC: 4.7 10*3/uL (ref 4.0–10.5)

## 2015-09-28 LAB — TYPE AND SCREEN
ABO/RH(D): A POS
ANTIBODY SCREEN: NEGATIVE

## 2015-09-28 LAB — APTT: APTT: 27 s (ref 24–37)

## 2015-09-28 LAB — ABO/RH: ABO/RH(D): A POS

## 2015-09-28 MED ORDER — SODIUM CHLORIDE 0.9 % IJ SOLN
3.0000 mL | Freq: Two times a day (BID) | INTRAMUSCULAR | Status: DC
  Administered 2015-09-28 – 2015-10-02 (×5): 3 mL via INTRAVENOUS

## 2015-09-28 MED ORDER — IPRATROPIUM-ALBUTEROL 0.5-2.5 (3) MG/3ML IN SOLN
3.0000 mL | Freq: Three times a day (TID) | RESPIRATORY_TRACT | Status: DC
  Administered 2015-09-28 – 2015-09-29 (×4): 3 mL via RESPIRATORY_TRACT
  Filled 2015-09-28 (×7): qty 3

## 2015-09-28 MED ORDER — SODIUM CHLORIDE 0.9 % IV SOLN
INTRAVENOUS | Status: DC
  Administered 2015-09-28: 03:00:00 via INTRAVENOUS

## 2015-09-28 MED ORDER — OCUVITE PO TABS
1.0000 | ORAL_TABLET | Freq: Two times a day (BID) | ORAL | Status: DC
  Administered 2015-09-28 – 2015-10-02 (×10): 1 via ORAL
  Filled 2015-09-28 (×12): qty 1

## 2015-09-28 MED ORDER — AMLODIPINE BESYLATE 10 MG PO TABS
10.0000 mg | ORAL_TABLET | Freq: Every day | ORAL | Status: DC
  Administered 2015-09-28 – 2015-10-02 (×5): 10 mg via ORAL
  Filled 2015-09-28 (×5): qty 1

## 2015-09-28 MED ORDER — ATORVASTATIN CALCIUM 10 MG PO TABS
10.0000 mg | ORAL_TABLET | Freq: Every day | ORAL | Status: DC
  Administered 2015-09-28 – 2015-10-01 (×4): 10 mg via ORAL
  Filled 2015-09-28 (×4): qty 1

## 2015-09-28 MED ORDER — DEXTROSE 5 % IV SOLN
1.0000 g | INTRAVENOUS | Status: DC
  Administered 2015-09-28 – 2015-10-01 (×4): 1 g via INTRAVENOUS
  Filled 2015-09-28 (×4): qty 10

## 2015-09-28 MED ORDER — NICOTINE 21 MG/24HR TD PT24
21.0000 mg | MEDICATED_PATCH | Freq: Every day | TRANSDERMAL | Status: DC
  Administered 2015-09-28 – 2015-10-02 (×5): 21 mg via TRANSDERMAL
  Filled 2015-09-28 (×5): qty 1

## 2015-09-28 MED ORDER — LOSARTAN POTASSIUM 50 MG PO TABS
100.0000 mg | ORAL_TABLET | Freq: Every day | ORAL | Status: DC
  Administered 2015-09-28 – 2015-10-02 (×5): 100 mg via ORAL
  Filled 2015-09-28 (×5): qty 2

## 2015-09-28 MED ORDER — ONDANSETRON HCL 4 MG/2ML IJ SOLN
4.0000 mg | Freq: Three times a day (TID) | INTRAMUSCULAR | Status: DC | PRN
  Administered 2015-09-28: 4 mg via INTRAVENOUS
  Filled 2015-09-28: qty 2

## 2015-09-28 MED ORDER — PANTOPRAZOLE SODIUM 40 MG PO TBEC
40.0000 mg | DELAYED_RELEASE_TABLET | Freq: Two times a day (BID) | ORAL | Status: DC
  Administered 2015-09-28 – 2015-10-02 (×7): 40 mg via ORAL
  Filled 2015-09-28 (×7): qty 1

## 2015-09-28 MED ORDER — ACETAMINOPHEN 650 MG RE SUPP: 650.0000 mg | Freq: Four times a day (QID) | RECTAL | Status: DC | PRN

## 2015-09-28 MED ORDER — ADULT MULTIVITAMIN LIQUID CH
5.0000 mL | Freq: Every day | ORAL | Status: DC
  Administered 2015-09-28 – 2015-10-02 (×3): 5 mL via ORAL
  Filled 2015-09-28 (×5): qty 5

## 2015-09-28 MED ORDER — ALUM & MAG HYDROXIDE-SIMETH 200-200-20 MG/5ML PO SUSP
30.0000 mL | Freq: Four times a day (QID) | ORAL | Status: DC | PRN
  Administered 2015-09-28: 30 mL via ORAL
  Filled 2015-09-28: qty 30

## 2015-09-28 MED ORDER — ACETAMINOPHEN 325 MG PO TABS: 650.0000 mg | ORAL_TABLET | Freq: Four times a day (QID) | ORAL | Status: DC | PRN

## 2015-09-28 MED ORDER — INSULIN ASPART 100 UNIT/ML ~~LOC~~ SOLN
0.0000 [IU] | Freq: Three times a day (TID) | SUBCUTANEOUS | Status: DC
  Administered 2015-09-28 (×2): 2 [IU] via SUBCUTANEOUS
  Administered 2015-09-28 – 2015-09-29 (×2): 3 [IU] via SUBCUTANEOUS
  Administered 2015-09-29: 2 [IU] via SUBCUTANEOUS
  Administered 2015-09-29: 3 [IU] via SUBCUTANEOUS
  Administered 2015-09-30: 7 [IU] via SUBCUTANEOUS
  Administered 2015-09-30: 2 [IU] via SUBCUTANEOUS
  Administered 2015-09-30: 5 [IU] via SUBCUTANEOUS
  Administered 2015-10-01: 3 [IU] via SUBCUTANEOUS
  Administered 2015-10-01 (×2): 5 [IU] via SUBCUTANEOUS
  Administered 2015-10-02: 3 [IU] via SUBCUTANEOUS

## 2015-09-28 MED ORDER — GUAIFENESIN ER 600 MG PO TB12
600.0000 mg | ORAL_TABLET | Freq: Two times a day (BID) | ORAL | Status: DC
  Administered 2015-09-28 – 2015-10-02 (×10): 600 mg via ORAL
  Filled 2015-09-28 (×10): qty 1

## 2015-09-28 MED ORDER — PANTOPRAZOLE SODIUM 40 MG IV SOLR
40.0000 mg | Freq: Once | INTRAVENOUS | Status: AC
  Administered 2015-09-28: 40 mg via INTRAVENOUS
  Filled 2015-09-28: qty 40

## 2015-09-28 MED ORDER — FLUTICASONE-SALMETEROL 250-50 MCG/DOSE IN AEPB: 1.0000 | INHALATION_SPRAY | Freq: Two times a day (BID) | RESPIRATORY_TRACT | Status: DC

## 2015-09-28 MED ORDER — TIOTROPIUM BROMIDE MONOHYDRATE 18 MCG IN CAPS
18.0000 ug | ORAL_CAPSULE | Freq: Every day | RESPIRATORY_TRACT | Status: DC
  Administered 2015-09-28 – 2015-10-02 (×4): 18 ug via RESPIRATORY_TRACT
  Filled 2015-09-28: qty 5

## 2015-09-28 MED ORDER — SERTRALINE HCL 100 MG PO TABS
100.0000 mg | ORAL_TABLET | Freq: Every day | ORAL | Status: DC
  Administered 2015-09-28 – 2015-10-02 (×5): 100 mg via ORAL
  Filled 2015-09-28 (×5): qty 1

## 2015-09-28 MED ORDER — MOMETASONE FURO-FORMOTEROL FUM 100-5 MCG/ACT IN AERO: 2.0000 | INHALATION_SPRAY | Freq: Two times a day (BID) | RESPIRATORY_TRACT | Status: DC

## 2015-09-28 MED ORDER — IPRATROPIUM-ALBUTEROL 0.5-2.5 (3) MG/3ML IN SOLN
3.0000 mL | RESPIRATORY_TRACT | Status: DC
  Administered 2015-09-28: 3 mL via RESPIRATORY_TRACT
  Filled 2015-09-28: qty 3

## 2015-09-28 MED ORDER — ALBUTEROL SULFATE (2.5 MG/3ML) 0.083% IN NEBU: 5.0000 mg | INHALATION_SOLUTION | RESPIRATORY_TRACT | Status: DC | PRN

## 2015-09-28 MED ORDER — INSULIN GLARGINE 100 UNIT/ML ~~LOC~~ SOLN
11.0000 [IU] | Freq: Every day | SUBCUTANEOUS | Status: DC
  Administered 2015-09-28 – 2015-10-01 (×4): 11 [IU] via SUBCUTANEOUS
  Filled 2015-09-28 (×4): qty 0.11

## 2015-09-28 NOTE — Consult Note (Signed)
I have been asked to see the patient by Dr. Glynn OctaveStephen Rancour, for evaluation and management of right hydronephrosis and ureteral stones.  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 The patient's evaluation demonstrated dilated loops of large and small intestines on plain film prompting a CT scan. The CT scan showed a atrophic right kidney with hydronephrosis and mid ureteral stones. The patient's urine analysis bacteria, white blood cells, squamous cells and mucus suggesting contamination but prompting treatment for urinary tract infection. A urine culture was sent at my request. The patient's renal function remains excellent. The patient is not complaining of any flank or CVA tenderness. He denies any progressive voiding symptoms including dysuria, worsening frequency, urgency, incontinence, or hematuria.  The patient was seen in our clinic one month ago after a CT scan was performed for similar symptoms of bloating and abdominal fullness.  His GU history is significant for numerous kidney stones. His last stone procedure was more than 6 years ago and was complicated and protracted based on the patient's history, although the details are not clear. There was no follow-up from that. He has not passed any stones since. 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 The kidney was atrophic with most certainly poor function. At the level of the stricture, there were calcifications.  A decision at that point given the patient's significant comorbidities, lack of symptoms, and excellent renal function was too proceed conservatively with no clinical intervention until the patient developed  symptoms, recurrent infections, or his clinical circumstances dictated intervention.    Review of systems: A 12 point comprehensive review of systems was obtained and is negative unless otherwise stated in the history of present illness.  Patient Active Problem List   Diagnosis Date Noted  . Kidney stone   . HLD (hyperlipidemia) 09/27/2015  . Hydroureteronephrosis 09/27/2015  . Abdominal bloating 09/27/2015  . UTI (lower urinary tract infection) 09/27/2015  . Depression 09/04/2015  . Obstructive chronic bronchitis with exacerbation (HCC)   . COPD exacerbation (HCC) 09/03/2015  . Dyspnea 06/02/2015  . Chronic respiratory failure with hypoxia (HCC) 06/02/2015  . Paroxysmal atrial fibrillation (HCC) 01/29/2015  . Aortic stenosis due to bicuspid aortic valve 10/23/2013  . Interatrial septal aneurysm with PFO 10/23/2013  . Tobacco use disorder 10/22/2013  . Stroke (HCC) 10/21/2013  . Essential hypertension 10/21/2013  . Diabetes mellitus without complication (HCC) 10/21/2013  . COPD (chronic obstructive pulmonary disease) (HCC) 10/21/2013    No current facility-administered medications on file prior to encounter.   Current Outpatient Prescriptions on File Prior to Encounter  Medication Sig Dispense Refill  . amLODipine (NORVASC) 10 MG tablet Take 10 mg by mouth daily.    . insulin glargine (LANTUS) 100 UNIT/ML injection Inject 16 Units into the skin at bedtime.     Marland Kitchen. losartan (COZAAR) 100 MG tablet Take 100 mg by mouth daily.    . metFORMIN (GLUCOPHAGE) 500 MG tablet Take 1 tablet (500 mg total) by mouth 2 (two) times daily with a meal. 60 tablet 0  . Multiple Vitamins-Minerals (MULTIVITAMIN PO) Take 1 tablet by mouth daily.    . OXYGEN-HELIUM IN Inhale 6 L into the lungs continuous.     . rivaroxaban (XARELTO) 20 MG TABS tablet Take 1  tablet (20 mg total) by mouth daily with supper. 30 tablet 11  . sertraline (ZOLOFT) 100 MG tablet Take 100 mg by mouth daily.    . sitaGLIPtin  (JANUVIA) 100 MG tablet Take 100 mg by mouth daily.    Marland Kitchen albuterol (PROVENTIL HFA;VENTOLIN HFA) 108 (90 BASE) MCG/ACT inhaler Inhale 1 puff into the lungs every 6 (six) hours as needed for wheezing or shortness of breath. 1 Inhaler 3  . albuterol (PROVENTIL) (2.5 MG/3ML) 0.083% nebulizer solution Take 3 mLs (2.5 mg total) by nebulization every 2 (two) hours as needed for wheezing or shortness of breath. 75 mL 2  . atorvastatin (LIPITOR) 20 MG tablet Take 0.5 tablets (10 mg total) by mouth daily at 6 PM. KEEP OV. 30 tablet 3  . benzonatate (TESSALON) 200 MG capsule Take 1 capsule (200 mg total) by mouth 2 (two) times daily as needed for cough. 20 capsule 0  . beta carotene w/minerals (OCUVITE) tablet Take 1 tablet by mouth 2 (two) times daily.    Marland Kitchen doxycycline (VIBRA-TABS) 100 MG tablet Take 1 tablet (100 mg total) by mouth every 12 (twelve) hours. 6 tablet 0  . Fluticasone-Salmeterol (ADVAIR) 250-50 MCG/DOSE AEPB Inhale 1 puff into the lungs 2 (two) times daily.    Marland Kitchen guaiFENesin (MUCINEX) 600 MG 12 hr tablet Take 1 tablet (600 mg total) by mouth 2 (two) times daily. 14 tablet 0  . ipratropium-albuterol (DUONEB) 0.5-2.5 (3) MG/3ML SOLN Take 3 mLs by nebulization 3 (three) times daily. 360 mL 2  . nicotine (NICODERM CQ - DOSED IN MG/24 HOURS) 21 mg/24hr patch Place 1 patch (21 mg total) onto the skin daily. 28 patch 0  . predniSONE (DELTASONE) 10 MG tablet Take 40mg  (4 tabs) x 3 days, then taper to 30mg  (3 tabs) x 3 days, then 20mg  (2 tabs) x 3days, then 10mg  (1 tab) x 3days, then OFF. 30 tablet 0  . Spacer/Aero-Holding Chambers (AEROCHAMBER MINI CHAMBER) DEVI Use as directed 1 Device 0  . tiotropium (SPIRIVA) 18 MCG inhalation capsule Place 18 mcg into inhaler and inhale daily.      Past Medical History  Diagnosis Date  . Diabetes mellitus without complication (HCC)   . COPD (chronic obstructive pulmonary disease) (HCC)   . Hypertension   . Renal disorder     kidney stones  . CVA (cerebral  infarction)   . Aortic stenosis     moderate by echo 10/2013  . Paroxysmal atrial fibrillation [facility])     Past Surgical History  Procedure Laterality Date  . Tee without cardioversion N/A 10/23/2013    Procedure: TRANSESOPHAGEAL ECHOCARDIOGRAM (TEE);  Surgeon: [name], MD;  Location: [facility] ENDOSCOPY;  Service: Cardiovascular;  Laterality: N/A;  . 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 Smoking status: Former Smoker -- 1.00 packs/day for 30 years    Types: Cigarettes    Quit date: 09/14/2003  . Smokeless tobacco: Current User    Types: Chew  . Alcohol Use: No    Family History  Problem Relation Age of Onset  . COPD Father   . COPD Brother     PE: Filed Vitals:   09/28/15 0121 09/28/15 0446 09/28/15 0541 09/28/15 0917  BP: 140/72  134/61 149/74  Pulse: 94  85 82  Temp: 97.6 F (36.4 C)  98.1 F (36.7 C) 98.6 F (37 C)  TempSrc: Oral  Oral  Oral  Resp: 20  18 20   Height: 5\' 6"  (1.676 m)     Weight: 86.8 kg (191 lb 5.8 oz)     SpO2: 94% 97% 99% 96%   Patient appears to be in no acute distress  patient is alert and oriented x3 Atraumatic normocephalic head No cervical or supraclavicular lymphadenopathy appreciated No increased work of breathing, no audible wheezes/rhonchi - breath sounds are distant Regular sinus rhythm/rate Abdomen is soft, nontender, mild distended, no CVA or suprapubic tenderness Lower extremities are symmetric without appreciable edema Grossly neurologically intact No identifiable skin lesions   Recent Labs  09/27/15 1803 09/28/15 0355  WBC 7.2 4.7  HGB 12.1* 11.5*  HCT 37.7* 35.7*    Recent Labs  09/27/15 1803 09/28/15 0355  NA 138 140  K 4.3 4.6  CL 102 99*  CO2 30 31  GLUCOSE 168* 302*  BUN 16 15  CREATININE 0.73 0.89  CALCIUM 8.7* 8.5*    Recent Labs  09/28/15 0355  INR 1.06   No results for input(s): LABURIN in the  last 72 hours. Results for orders placed or performed during the hospital encounter of 09/03/15  MRSA PCR Screening     Status: None   Collection Time: 09/04/15  1:00 AM  Result Value Ref Range Status   MRSA by PCR NEGATIVE NEGATIVE Final    Comment:        The GeneXpert MRSA Assay (FDA approved for NASAL specimens only), is one component of a comprehensive MRSA colonization surveillance program. It is not intended to diagnose MRSA infection nor to guide or monitor treatment for MRSA infections.   Culture, sputum-assessment     Status: None   Collection Time: 09/04/15  2:05 AM  Result Value Ref Range Status   Specimen Description EXPECTORATED SPUTUM  Final   Special Requests NONE  Final   Sputum evaluation   Final    THIS SPECIMEN IS ACCEPTABLE. RESPIRATORY CULTURE REPORT TO FOLLOW.   Report Status 09/04/2015 FINAL  Final  Culture, respiratory (NON-Expectorated)     Status: None   Collection Time: 09/04/15  2:05 AM  Result Value Ref Range Status   Specimen Description EXPECTORATED SPUTUM  Final   Special Requests NONE  Final   Gram Stain   Final    ABUNDANT WBC PRESENT, PREDOMINANTLY PMN RARE SQUAMOUS EPITHELIAL CELLS PRESENT ABUNDANT GRAM NEGATIVE COCCOBACILLI FEW GRAM POSITIVE COCCI IN PAIRS    Culture   Final    ABUNDANT HAEMOPHILUS INFLUENZAE Note: BETA LACTAMASE POSITIVE Performed at [facility]    Report Status 09/07/2015 FINAL  Final    Imaging: I have independently reviewed the patient's CT scan performed in the emergency department yesterday and was able to compare this to the images obtained 6 weeks prior.  Imp: The patient has a chronically dilated right kidney with proximal hydronephrosis down to the level of a mid ureteral stricture. To me, the calcifications in the ureter seemed to be intramural/within the wall of the ureter but nonetheless causing hydronephrosis. The scan has not changed significantly from his CT scan obtained 6 weeks  prior. The patient has significant comorbidities including end-stage COPD. Abdominal distention appears to be postprandial, gallbladder etiology workup is pending.  Recommendations: I had a long discussion with the patient and his daughter in regards to the patient's clinical circumstances. The patient's CT scan has not changed over the course of the last 6 weeks. He is asymptomatic. His renal function is normal. Any sort of intervention at  this point is likely to do more harm than good. Together, we opted to not pursue any additional workup until the patient developed symptoms, recurrent infections, or his clinical circumstances dictated intervention. This is in line with Dr. Lenoria Chime recommendation 6 weeks prior.    Thank you for involving me in this patient's care,  please page with any further questions or concerns. Berniece Salines W

## 2015-09-28 NOTE — Progress Notes (Signed)
[facility] PROGRESS NOTE  Brett Michael ZHY:[id number] DOB: 09/12/1941 DOA: 09/27/2015  PCP: Tommy RainwaterShamleffer, Ibethal JARALLA, MD  Brief HPI: 75 year old Caucasian male with past medical history as stated below, presented with abdominal bloating and increased urinary frequency. He was complaining of burping and belching. He was also complaining of heartburn with regurgitation of food. In the emergency department CT scan revealed severe right-sided hydronephrosis with renal calculations. Patient was hospitalized for further management.  Past medical history:  Past Medical History  Diagnosis Date  . Diabetes mellitus without complication (HCC)   . COPD (chronic obstructive pulmonary disease) (HCC)   . Hypertension   . Renal disorder     kidney stones  . CVA (cerebral infarction)   . Aortic stenosis     moderate by echo 10/2013  . Paroxysmal atrial fibrillation [facility](HCC)     Consultants: Urology  Procedures: None  Antibiotics: Ceftriaxone  Subjective: Patient feels better this morning. Denies any abdominal pain, nausea or vomiting. Requesting something to eat. His daughter-in-law is at the bedside.   Objective: Vital Signs  Filed Vitals:   09/28/15 0541 09/28/15 0917 09/28/15 1116 09/28/15 1324  BP: 134/61 149/74  137/67  Pulse: 85 82  88  Temp: 98.1 F (36.7 C) 98.6 F (37 C)  99.1 F (37.3 C)  TempSrc: Oral Oral  Oral  Resp: 18 20  24   Height:      Weight:      SpO2: 99% 96% 96% 95%    Intake/Output Summary (Last 24 hours) at 09/28/15 1335 Last data filed at 09/28/15 1300  Gross per 24 hour  Intake 1896.67 ml  Output    845 ml  Net 1051.67 ml   Filed Weights   09/27/15 1616 09/28/15 0121  Weight: 86.183 kg (190 lb) 86.8 kg (191 lb 5.8 oz)    General appearance: alert, cooperative, appears stated age, no distress and moderately obese Resp: Course breath sounds bilaterally. No crackles, no wheezing. Cardio: regular rate and rhythm, S1, S2 normal, no  murmur, click, rub or gallop GI: soft, non-tender; bowel sounds normal; no masses,  no organomegaly Extremities: extremities normal, atraumatic, no cyanosis or edema Neurologic: Awake and alert. Oriented 3. No focal neurological deficits.  Lab Results:  Basic Metabolic Panel:  Recent Labs Lab 09/27/15 1803 09/28/15 0355  NA 138 140  K 4.3 4.6  CL 102 99*  CO2 30 31  GLUCOSE 168* 302*  BUN 16 15  CREATININE 0.73 0.89  CALCIUM 8.7* 8.5*   Liver Function Tests:  Recent Labs Lab 09/27/15 1803 09/28/15 0355  AST 13* 12*  ALT 23 20  ALKPHOS 63 61  BILITOT 0.7 0.7  PROT 6.7 6.3*  ALBUMIN 3.7 3.5    Recent Labs Lab 09/27/15 1803  LIPASE 25   CBC:  Recent Labs Lab 09/27/15 1803 09/28/15 0355  WBC 7.2 4.7  NEUTROABS 6.0  --   HGB 12.1* 11.5*  HCT 37.7* 35.7*  MCV 91.1 92.0  PLT 105* 100*   Cardiac Enzymes:  Recent Labs Lab 09/27/15 1803  TROPONINI <0.03   BNP (last 3 results)  Recent Labs  09/03/15 2156 09/27/15 1803  BNP 77.9 72.4    CBG:  Recent Labs Lab 09/28/15 0735 09/28/15 1134  GLUCAP 236* 191*    No results found for this or any previous visit (from the past 240 hour(s)).    Studies/Results: Ct Abdomen Pelvis Wo Contrast  09/27/2015  CLINICAL DATA:  Abdominal distention for 2-3 days.  Pain. EXAM:  CT ABDOMEN AND PELVIS WITHOUT CONTRAST TECHNIQUE: Multidetector CT imaging of the abdomen and pelvis was performed following the standard protocol without IV contrast. COMPARISON:  08/12/2015 FINDINGS: Lower chest: Mild bibasilar chronic interstitial disease. Normal heart size. Hepatobiliary: Normal liver. Cholelithiasis. No intrahepatic or extrahepatic biliary ductal dilatation. Pancreas: Normal. Spleen: Normal. Adrenals/Urinary Tract: Normal adrenal glands. Severe right hydronephrosis with severe right renal cortical thinning. Normal left kidney. There is a right ureteral calcification measuring 2.7 mm in transverse dimension and 2.8 cm in  craniocaudal dimension. Decompressed bladder. Stomach/Bowel: No bowel wall thickening or dilatation. Fact containing right inguinal hernia. Diverticulosis without evidence of diverticulitis. No pneumatosis, pneumoperitoneum or portal venous gas. Small fat containing umbilical hernia. Vascular/Lymphatic: Normal caliber abdominal aorta. No lymphadenopathy. Other: No fluid collection or hematoma. Musculoskeletal: No lytic or sclerotic osseous lesion. 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 IMPRESSION: 1. Severe right hydronephrosis with severe right renal cortical thinning. There is a right mid ureteral calcification measuring 2.7 mm in transverse dimension and 2.8 cm in craniocaudal dimension. 2. 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 3. Cholelithiasis. Electronically Signed   By: Elige Ko   On: 09/27/2015 18:58   Dg Abd Acute W/chest  09/27/2015  CLINICAL DATA:  Abdominal pain and bloating for several days. EXAM: DG ABDOMEN ACUTE W/ 1V CHEST COMPARISON:  None. FINDINGS: There is no evidence of free intraperitoneal air. There are dilated loops of small bowel in the left upper quadrant measuring up to 5 cm. There is air within the colon. No radiopaque calculi or other significant radiographic abnormality is seen. Heart size and mediastinal contours are within normal limits. The lungs are hyperinflated likely secondary to COPD. IMPRESSION: Dilated loops of small bowel in the left upper quadrant measuring up to 5 cm with a air in the colon. This may reflect an ileus versus partial small bowel obstruction. Electronically Signed   By: Elige Ko   On: 09/27/2015 17:33    Medications:  Scheduled: . amLODipine  10 mg Oral Daily  . atorvastatin  10 mg Oral q1800  . beta carotene  w/minerals  1 tablet Oral BID  . cefTRIAXone (ROCEPHIN)  IV  1 g Intravenous Q24H  . Fluticasone-Salmeterol  1 puff Inhalation BID  . guaiFENesin  600 mg Oral BID  . insulin aspart  0-9 Units Subcutaneous TID WC  . insulin glargine  11 Units Subcutaneous QHS  . ipratropium-albuterol  3 mL Nebulization TID  . losartan  100 mg Oral Daily  . multivitamin  5 mL Oral Daily  . nicotine  21 mg Transdermal Daily  . pantoprazole  40 mg Oral BID AC  . sertraline  100 mg Oral Daily  . sodium chloride  3 mL Intravenous Q12H  . tiotropium  18 mcg Inhalation Daily   Continuous:  [id number] **OR** acetaminophen, albuterol, ondansetron  Assessment/Plan:  Principal Problem:   Abdominal bloating Active Problems:   Stroke [facility])   Essential hypertension   Diabetes mellitus without complication (HCC)   COPD (chronic obstructive pulmonary disease) (HCC)   Tobacco use disorder   Aortic stenosis due to bicuspid aortic valve   Interatrial septal aneurysm with PFO   Paroxysmal atrial fibrillation (HCC)   Chronic respiratory failure with hypoxia (HCC)   Depression   HLD (hyperlipidemia)   Hydroureteronephrosis   UTI (lower urinary  tract infection)   Kidney stone    Abdominal bloating His symptoms are most likely unrelated to the hydronephrosis detected on CT scan. His symptoms are more suggestive of dyspepsia and GERD. We will initiate PPI. CT scan did show that patient has gallstones. LFTs are normal. We will check ultrasound of the right upper quadrant. Okay for him to eat.  Severe right hydronephrosis CT abdomen/pelvis has no bowel obstruction, but showed old severe right hydronephrosis with severe right renal cortical thinning and right mid ureteral calcification. Urology was consulted. Is closed with Dr. Marlou Porch this morning. These changes are chronic. Patient's renal function is normal. He has good urine output. No indication for acute intervention.  UTI Continue ceftriaxone. Await  urine cultures  History of COPD Patient is oxygen dependent and wears 6 L nasal cannula oxygen at baseline. He does not seem to have acute exacerbation. X-ray of acute abd/chest did not show infiltration. Continue home medications. Continue nebulizers.  Paroxysmal atrial fibrillation on Xarelto Heart rate is reasonably well controlled. Chadvasc score 4. Currently on anticoagulation of Xarelto. Okay to resume anticoagulation.  Diabetes mellitus type 2 Last hemoglobin A1c on record was noted to be 6.7 in 10/22/2013. Patient is taken Januvia, metformin and Lantus.  Essential Hypertension Continue amlodipine and losartan  Thrombocytopenia Platelet count noted to be 106, no bleeding tendency. Continue to monitor by CBC  Depression Stable, no suicidal or homicidal ideations. Continue home medications: Zoloft  Tobacco abuse: Nicotine patch  DVT Prophylaxis: On anticoagulation    Code Status: Full code  Family Communication: Discussed with the patient and his daughter-in-law  Disposition Plan: Await ultrasound of the abdomen. PPI. Mobilize. Anticipate discharge tomorrow.   LOS: 1 day   [facility]  [facility] Pager [phone] 09/28/2015, 1:35 PM  If 7PM-7AM, please contact night-coverage at [email or web address], password [facility]

## 2015-09-29 ENCOUNTER — Inpatient Hospital Stay (HOSPITAL_COMMUNITY): Payer: PPO

## 2015-09-29 DIAGNOSIS — N390 Urinary tract infection, site not specified: Secondary | ICD-10-CM

## 2015-09-29 LAB — CBC
HCT: 35.6 % — ABNORMAL LOW (ref 39.0–52.0)
Hemoglobin: 11.3 g/dL — ABNORMAL LOW (ref 13.0–17.0)
MCH: 29.5 pg (ref 26.0–34.0)
MCHC: 31.7 g/dL (ref 30.0–36.0)
MCV: 93 fL (ref 78.0–100.0)
PLATELETS: 102 10*3/uL — AB (ref 150–400)
RBC: 3.83 MIL/uL — AB (ref 4.22–5.81)
RDW: 16.2 % — ABNORMAL HIGH (ref 11.5–15.5)
WBC: 5.8 10*3/uL (ref 4.0–10.5)

## 2015-09-29 LAB — COMPREHENSIVE METABOLIC PANEL
ALT: 21 U/L (ref 17–63)
AST: 16 U/L (ref 15–41)
Albumin: 3.1 g/dL — ABNORMAL LOW (ref 3.5–5.0)
Alkaline Phosphatase: 52 U/L (ref 38–126)
Anion gap: 7 (ref 5–15)
BILIRUBIN TOTAL: 0.5 mg/dL (ref 0.3–1.2)
BUN: 17 mg/dL (ref 6–20)
CALCIUM: 8.4 mg/dL — AB (ref 8.9–10.3)
CHLORIDE: 105 mmol/L (ref 101–111)
CO2: 30 mmol/L (ref 22–32)
CREATININE: 0.95 mg/dL (ref 0.61–1.24)
Glucose, Bld: 236 mg/dL — ABNORMAL HIGH (ref 65–99)
Potassium: 4.3 mmol/L (ref 3.5–5.1)
Sodium: 142 mmol/L (ref 135–145)
TOTAL PROTEIN: 5.6 g/dL — AB (ref 6.5–8.1)

## 2015-09-29 LAB — GLUCOSE, CAPILLARY
GLUCOSE-CAPILLARY: 206 mg/dL — AB (ref 65–99)
GLUCOSE-CAPILLARY: 151 mg/dL — AB (ref 65–99)
GLUCOSE-CAPILLARY: 287 mg/dL — AB (ref 65–99)
Glucose-Capillary: 210 mg/dL — ABNORMAL HIGH (ref 65–99)

## 2015-09-29 LAB — URINE CULTURE

## 2015-09-29 MED ORDER — ALPRAZOLAM 0.5 MG PO TABS
0.5000 mg | ORAL_TABLET | Freq: Three times a day (TID) | ORAL | Status: DC | PRN
  Administered 2015-09-29 – 2015-09-30 (×2): 0.5 mg via ORAL
  Filled 2015-09-29 (×2): qty 1

## 2015-09-29 MED ORDER — SODIUM CHLORIDE 0.9 % IV SOLN
INTRAVENOUS | Status: DC
  Administered 2015-09-29 – 2015-10-01 (×5): via INTRAVENOUS

## 2015-09-29 MED ORDER — SALINE SPRAY 0.65 % NA SOLN
1.0000 | NASAL | Status: DC | PRN
  Administered 2015-09-29: 1 via NASAL
  Filled 2015-09-29: qty 44

## 2015-09-29 MED ORDER — FLUTICASONE-SALMETEROL 250-50 MCG/DOSE IN AEPB
1.0000 | INHALATION_SPRAY | Freq: Two times a day (BID) | RESPIRATORY_TRACT | Status: DC
  Administered 2015-09-29 – 2015-10-02 (×6): 1 via RESPIRATORY_TRACT

## 2015-09-29 MED ORDER — MORPHINE SULFATE (PF) 4 MG/ML IV SOLN
3.5000 mg | Freq: Once | INTRAVENOUS | Status: AC
Start: 2015-09-29 — End: 2015-09-29
  Administered 2015-09-29: 3.5 mg via INTRAVENOUS
  Filled 2015-09-29: qty 1

## 2015-09-29 MED ORDER — TECHNETIUM TC 99M MEBROFENIN IV KIT
5.3000 | PACK | Freq: Once | INTRAVENOUS | Status: AC | PRN
  Administered 2015-09-29: 5.3 via INTRAVENOUS

## 2015-09-29 NOTE — Progress Notes (Signed)
PT Cancellation Note  Patient Details Name: Lucila MaineJoe A Zook MRN: [id number] DOB: [birth year]   Cancelled Treatment:    Reason Eval/Treat Not Completed: Patient at procedure or test/unavailable. Will check back as schedule allows.    Rebeca AlertJannie Antoine Vandermeulen, MPT Pager: [phone]

## 2015-09-29 NOTE — Progress Notes (Signed)
PT Cancellation Note  Patient Details Name: Brett Michael MRN: [id number] DOB: November 30, 1940   Cancelled Treatment:    Reason Eval/Treat Not Completed: Patient at procedure or test/unavailable--2 attempts to work with pt on today-pt having test/procedure both times.  Will check back another day. thanks.    [name], MPT Pager: [phone]

## 2015-09-29 NOTE — Consult Note (Signed)
Reason for Consult: Evaluate abdominal bloating and solitary gallstone.  Rule out cholecystitis  Referring Physician: Jermy Couper is an 75 y.o. male.   HPI:  This is a 75 year old gentleman with a history of oxygen dependent COPD, limiting him to bed to chair on 6 L of nasal O2, followed by Dr. Lamonte Sakai.  Also diabetes mellitus type 2, depression, aortic stenosis, paroxysmal atrial fibrillation on Xarelto, hyperlipidemia, depression, stroke, hypertension, right ureteral stone, right hydronephrosis.      In this setting he presented and was admitted on 09/27/2015 with abdominal bloating.  He actually states that it has been intermittent for 2 months.  He is been tolerating his meals.  Had one episode of diarrhea 2 days ago otherwise bowel movements are normal.  He is not a good historian.  He apparently has reported to other physicians that this is somewhat postprandial and that he's had some nausea.  He denies pain to me.  Family says that last month he was complaining of right-sided pain.  Lipase and liver function tests are normal.  WBC 5800.  Hemoglobin 11.3.     When he was admitted 2 days ago he was found to have normal liver function tests, normal lipase, normal CBC.  Abdominal x-ray showed dilated loops of small bowel in the left upper quadrant measuring up to 5 cm with air in the colon.  CT head and pelvis showed no bowel dilatation which showed severe right hydronephrosis with right renal cortical thickening and mid ureteral calcification measuring 2.7 mm.  These findings are not new and are similar to prior CT findings last November.  The CT also shows a small gallstone but no inflammatory changes in the right upper quadrant. Ultrasound shows a 7 mm stone in the neck of the gallbladder but no significant gallbladder wall thickening or fluid and sonographic Murphy sign is negative.  Common bile duct measures 3.3 mm.  Nuclear medicine hepatobiliary imaging today shows  nonvisualization of the gallbladder one hour after morphine.  They felt that this was suspicious for cholecystitis.  After he came back from the HIDA scan this afternoon he ate his entire tray of regular food.     The patient has been evaluated by urology and they felt that he was essentially asymptomatic from a renal simple standpoint and the hydronephrosis was long-standing likely a result of stone surgery 6 or more years ago.  They did not feel that there was anything to do urologically.  They noted that Dr. Diona Fanti  had made similar recommendation 6 weeks prior.     The general surgery issue is whether he has acute cholecystitis and whether he would benefit from cholecystostomy or cholecystectomy.               Past Medical History  Diagnosis Date  . Diabetes mellitus without complication ([place])   . COPD (chronic obstructive pulmonary disease) ([place])   . Hypertension   . Renal disorder     kidney stones  . CVA (cerebral infarction)   . Aortic stenosis     moderate by echo 10/2013  . Paroxysmal atrial fibrillation [facility])     Past Surgical History  Procedure Laterality Date  . Tee without cardioversion N/A 10/23/2013    Procedure: TRANSESOPHAGEAL ECHOCARDIOGRAM (TEE);  Surgeon: Pixie Casino, MD;  Location: [facility] ENDOSCOPY;  Service: Cardiovascular;  Laterality: N/A;  . 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 COPD Father   . COPD Brother     Social History:  reports that he quit smoking about 12 years ago. His smoking use included Cigarettes. He has a 30 pack-year smoking history. His smokeless tobacco use includes Chew. He reports that he does not drink alcohol. His drug history is not on file.  Allergies:  Allergies  Allergen Reactions  . Symbicort [Budesonide-Formoterol Fumarate] Anaphylaxis and Swelling    Tongue swells    Medications: I have reviewed the  patient's current medications.  Results for orders placed or performed during the hospital encounter of 09/27/15 (from the past 48 hour(s))  CBC with Differential/Platelet     Status: Abnormal   Collection Time: 09/27/15  6:03 PM  Result Value Ref Range   WBC 7.2 4.0 - 10.5 K/uL   RBC 4.14 (L) 4.22 - 5.81 MIL/uL   Hemoglobin 12.1 (L) 13.0 - 17.0 g/dL   HCT 37.7 (L) 39.0 - 52.0 %   MCV 91.1 78.0 - 100.0 fL   MCH 29.2 26.0 - 34.0 pg   MCHC 32.1 30.0 - 36.0 g/dL   RDW 16.2 (H) 11.5 - 15.5 %   Platelets 105 (L) 150 - 400 K/uL   Neutrophils Relative % 83 %   Neutro Abs 6.0 1.7 - 7.7 K/uL   Lymphocytes Relative 10 %   Lymphs Abs 0.7 0.7 - 4.0 K/uL   Monocytes Relative 6 %   Monocytes Absolute 0.4 0.1 - 1.0 K/uL   Eosinophils Relative 2 %   Eosinophils Absolute 0.1 0.0 - 0.7 K/uL   Basophils Relative 0 %   Basophils Absolute 0.0 0.0 - 0.1 K/uL  Comprehensive metabolic panel     Status: Abnormal   Collection Time: 09/27/15  6:03 PM  Result Value Ref Range   Sodium 138 135 - 145 mmol/L   Potassium 4.3 3.5 - 5.1 mmol/L   Chloride 102 101 - 111 mmol/L   CO2 30 22 - 32 mmol/L   Glucose, Bld 168 (H) 65 - 99 mg/dL   BUN 16 6 - 20 mg/dL   Creatinine, Ser 0.73 0.61 - 1.24 mg/dL   Calcium 8.7 (L) 8.9 - 10.3 mg/dL   Total Protein 6.7 6.5 - 8.1 g/dL   Albumin 3.7 3.5 - 5.0 g/dL   AST 13 (L) 15 - 41 U/L   ALT 23 17 - 63 U/L   Alkaline Phosphatase 63 38 - 126 U/L   Total Bilirubin 0.7 0.3 - 1.2 mg/dL   GFR calc non Af Amer >60 >60 mL/min   GFR calc Af Amer >60 >60 mL/min    Comment: (NOTE) The eGFR has been calculated using the CKD EPI equation. This calculation has not been validated in all clinical situations. eGFR's persistently <60 mL/min signify possible Chronic Kidney Disease.    Anion gap 6 5 - 15  Lipase, blood     Status: None   Collection Time: 09/27/15  6:03 PM  Result Value Ref Range   Lipase 25 11 - 51 U/L  Troponin I     Status: None   Collection Time: 09/27/15  6:03  PM  Result Value Ref Range   Troponin I <0.03 <0.031 ng/mL    Comment:        NO INDICATION OF MYOCARDIAL INJURY.   Brain natriuretic peptide     Status: None   Collection Time: 09/27/15  6:03 PM  Result Value Ref Range   B  Natriuretic Peptide 72.4 0.0 - 100.0 pg/mL  I-Stat CG4 Lactic Acid, ED     Status: None   Collection Time: 09/27/15  6:06 PM  Result Value Ref Range   Lactic Acid, Venous 0.87 0.5 - 2.0 mmol/L  Urinalysis, Routine w reflex microscopic (not at [facility])     Status: Abnormal   Collection Time: 09/27/15  7:20 PM  Result Value Ref Range   Color, Urine YELLOW YELLOW   APPearance CLOUDY (A) CLEAR   Specific Gravity, Urine 1.021 1.005 - 1.030   pH 5.5 5.0 - 8.0   Glucose, UA NEGATIVE NEGATIVE mg/dL   Hgb urine dipstick NEGATIVE NEGATIVE   Bilirubin Urine NEGATIVE NEGATIVE   Ketones, ur NEGATIVE NEGATIVE mg/dL   Protein, ur 30 (A) NEGATIVE mg/dL   Nitrite NEGATIVE NEGATIVE   Leukocytes, UA MODERATE (A) NEGATIVE  Urine microscopic-add on     Status: Abnormal   Collection Time: 09/27/15  7:20 PM  Result Value Ref Range   Squamous Epithelial / LPF 0-5 (A) NONE SEEN   WBC, UA 6-30 0 - 5 WBC/hpf   RBC / HPF NONE SEEN 0 - 5 RBC/hpf   Bacteria, UA MANY (A) NONE SEEN   Urine-Other MUCOUS PRESENT     Comment: YEAST PRESENT  Urine culture     Status: None   Collection Time: 09/27/15  7:20 PM  Result Value Ref Range   Specimen Description URINE, CLEAN CATCH    Special Requests NONE    Culture      MULTIPLE SPECIES PRESENT, SUGGEST RECOLLECTION Performed at [facility]    Report Status 09/29/2015 FINAL   Culture, blood (Routine X 2) w Reflex to ID Panel     Status: None (Preliminary result)   Collection Time: 09/28/15  3:50 AM  Result Value Ref Range   Specimen Description BLOOD RIGHT ARM    Special Requests BOTTLES DRAWN AEROBIC AND ANAEROBIC 6CC    Culture      NO GROWTH 1 DAY Performed at [facility]    Report Status PENDING   Culture, blood  (Routine X 2) w Reflex to ID Panel     Status: None (Preliminary result)   Collection Time: 09/28/15  3:55 AM  Result Value Ref Range   Specimen Description BLOOD RIGHT HAND    Special Requests IN PEDIATRIC BOTTLE 4CC    Culture      NO GROWTH 1 DAY Performed at [facility]    Report Status PENDING   Protime-INR     Status: None   Collection Time: 09/28/15  3:55 AM  Result Value Ref Range   Prothrombin Time 14.0 11.6 - 15.2 seconds   INR 1.06 0.00 - 1.49  APTT     Status: None   Collection Time: 09/28/15  3:55 AM  Result Value Ref Range   aPTT 27 24 - 37 seconds  Type and screen [place]     Status: None   Collection Time: 09/28/15  3:55 AM  Result Value Ref Range   ABO/RH(D) A POS    Antibody Screen NEG    Sample Expiration 20/23/3435   Basic metabolic panel     Status: Abnormal   Collection Time: 09/28/15  3:55 AM  Result Value Ref Range   Sodium 140 135 - 145 mmol/L   Potassium 4.6 3.5 - 5.1 mmol/L   Chloride 99 (L) 101 - 111 mmol/L   CO2 31 22 - 32 mmol/L   Glucose, Bld  302 (H) 65 - 99 mg/dL   BUN 15 6 - 20 mg/dL   Creatinine, Ser 0.89 0.61 - 1.24 mg/dL   Calcium 8.5 (L) 8.9 - 10.3 mg/dL   GFR calc non Af Amer >60 >60 mL/min   GFR calc Af Amer >60 >60 mL/min    Comment: (NOTE) The eGFR has been calculated using the CKD EPI equation. This calculation has not been validated in all clinical situations. eGFR's persistently <60 mL/min signify possible Chronic Kidney Disease.    Anion gap 10 5 - 15  CBC     Status: Abnormal   Collection Time: 09/28/15  3:55 AM  Result Value Ref Range   WBC 4.7 4.0 - 10.5 K/uL   RBC 3.88 (L) 4.22 - 5.81 MIL/uL   Hemoglobin 11.5 (L) 13.0 - 17.0 g/dL   HCT 35.7 (L) 39.0 - 52.0 %   MCV 92.0 78.0 - 100.0 fL   MCH 29.6 26.0 - 34.0 pg   MCHC 32.2 30.0 - 36.0 g/dL   RDW 15.9 (H) 11.5 - 15.5 %   Platelets 100 (L) 150 - 400 K/uL    Comment: SPECIMEN CHECKED FOR CLOTS REPEATED TO VERIFY PLATELET COUNT  CONFIRMED BY SMEAR LARGE PLATELETS PRESENT   ABO/Rh     Status: None   Collection Time: 09/28/15  3:55 AM  Result Value Ref Range   ABO/RH(D) A POS   Hepatic function panel     Status: Abnormal   Collection Time: 09/28/15  3:55 AM  Result Value Ref Range   Total Protein 6.3 (L) 6.5 - 8.1 g/dL   Albumin 3.5 3.5 - 5.0 g/dL   AST 12 (L) 15 - 41 U/L   ALT 20 17 - 63 U/L   Alkaline Phosphatase 61 38 - 126 U/L   Total Bilirubin 0.7 0.3 - 1.2 mg/dL   Bilirubin, Direct <0.1 (L) 0.1 - 0.5 mg/dL   Indirect Bilirubin NOT CALCULATED 0.3 - 0.9 mg/dL  Glucose, capillary     Status: Abnormal   Collection Time: 09/28/15  7:35 AM  Result Value Ref Range   Glucose-Capillary 236 (H) 65 - 99 mg/dL   Comment 1 Notify RN    Comment 2 Document in Chart   Glucose, capillary     Status: Abnormal   Collection Time: 09/28/15 11:34 AM  Result Value Ref Range   Glucose-Capillary 191 (H) 65 - 99 mg/dL  Glucose, capillary     Status: Abnormal   Collection Time: 09/28/15  4:36 PM  Result Value Ref Range   Glucose-Capillary 165 (H) 65 - 99 mg/dL   Comment 1 Notify RN    Comment 2 Document in Chart   Glucose, capillary     Status: Abnormal   Collection Time: 09/28/15 11:06 PM  Result Value Ref Range   Glucose-Capillary 214 (H) 65 - 99 mg/dL  CBC     Status: Abnormal   Collection Time: 09/29/15  4:36 AM  Result Value Ref Range   WBC 5.8 4.0 - 10.5 K/uL   RBC 3.83 (L) 4.22 - 5.81 MIL/uL   Hemoglobin 11.3 (L) 13.0 - 17.0 g/dL   HCT 35.6 (L) 39.0 - 52.0 %   MCV 93.0 78.0 - 100.0 fL   MCH 29.5 26.0 - 34.0 pg   MCHC 31.7 30.0 - 36.0 g/dL   RDW 16.2 (H) 11.5 - 15.5 %   Platelets 102 (L) 150 - 400 K/uL    Comment: CONSISTENT WITH PREVIOUS RESULT  Comprehensive metabolic panel  Status: Abnormal   Collection Time: 09/29/15  4:36 AM  Result Value Ref Range   Sodium 142 135 - 145 mmol/L   Potassium 4.3 3.5 - 5.1 mmol/L   Chloride 105 101 - 111 mmol/L   CO2 30 22 - 32 mmol/L   Glucose, Bld 236 (H) 65 -  99 mg/dL   BUN 17 6 - 20 mg/dL   Creatinine, Ser 0.95 0.61 - 1.24 mg/dL   Calcium 8.4 (L) 8.9 - 10.3 mg/dL   Total Protein 5.6 (L) 6.5 - 8.1 g/dL   Albumin 3.1 (L) 3.5 - 5.0 g/dL   AST 16 15 - 41 U/L   ALT 21 17 - 63 U/L   Alkaline Phosphatase 52 38 - 126 U/L   Total Bilirubin 0.5 0.3 - 1.2 mg/dL   GFR calc non Af Amer >60 >60 mL/min   GFR calc Af Amer >60 >60 mL/min    Comment: (NOTE) The eGFR has been calculated using the CKD EPI equation. This calculation has not been validated in all clinical situations. eGFR's persistently <60 mL/min signify possible Chronic Kidney Disease.    Anion gap 7 5 - 15  Glucose, capillary     Status: Abnormal   Collection Time: 09/29/15  7:29 AM  Result Value Ref Range   Glucose-Capillary 206 (H) 65 - 99 mg/dL  Glucose, capillary     Status: Abnormal   Collection Time: 09/29/15 11:53 AM  Result Value Ref Range   Glucose-Capillary 151 (H) 65 - 99 mg/dL  Glucose, capillary     Status: Abnormal   Collection Time: 09/29/15  4:40 PM  Result Value Ref Range   Glucose-Capillary 210 (H) 65 - 99 mg/dL    Ct Abdomen Pelvis Wo Contrast  09/27/2015  CLINICAL DATA:  Abdominal distention for 2-3 days.  Pain. EXAM: CT ABDOMEN AND PELVIS WITHOUT CONTRAST TECHNIQUE: Multidetector CT imaging of the abdomen and pelvis was performed following the standard protocol without IV contrast. COMPARISON:  08/12/2015 FINDINGS: Lower chest: Mild bibasilar chronic interstitial disease. Normal heart size. Hepatobiliary: Normal liver. Cholelithiasis. No intrahepatic or extrahepatic biliary ductal dilatation. Pancreas: Normal. Spleen: Normal. Adrenals/Urinary Tract: Normal adrenal glands. Severe right hydronephrosis with severe right renal cortical thinning. Normal left kidney. There is a right ureteral calcification measuring 2.7 mm in transverse dimension and 2.8 cm in craniocaudal dimension. Decompressed bladder. Stomach/Bowel: No bowel wall thickening or dilatation. Fact  containing right inguinal hernia. Diverticulosis without evidence of diverticulitis. No pneumatosis, pneumoperitoneum or portal venous gas. Small fat containing umbilical hernia. Vascular/Lymphatic: Normal caliber abdominal aorta. No lymphadenopathy. Other: No fluid collection or hematoma. Musculoskeletal: No lytic or sclerotic osseous lesion. 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 IMPRESSION: 1. Severe right hydronephrosis with severe right renal cortical thinning. There is a right mid ureteral calcification measuring 2.7 mm in transverse dimension and 2.8 cm in craniocaudal dimension. 2. 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 3. Cholelithiasis. Electronically Signed   By: Kathreen Devoid   On: 09/27/2015 18:58   Nm Hepatobiliary Liver Func  09/29/2015  CLINICAL DATA:  Abdominal bloating, reflux, dyspepsia. Cholelithiasis on abdominal ultrasound. EXAM: NUCLEAR MEDICINE HEPATOBILIARY IMAGING TECHNIQUE: Sequential images of the abdomen were obtained out to 60 minutes following intravenous administration of radiopharmaceutical. RADIOPHARMACEUTICALS:  6.16 mCi Tc-71m Choletec IV COMPARISON:  Abdominal ultrasound dated 09/29/2015. FINDINGS:  Normal hepatic excretion. Visualization of small bowel within 10-15 minutes. Nonvisualization of the gallbladder at 1 hour. 3.47 mg morphine was administered IV. Nonvisualization of the gallbladder after 1 hour post morphine administration, suspicious for acute cholecystitis. IMPRESSION: Nonvisualization of the gallbladder, suspicious for acute cholecystitis. These results will be called to the ordering clinician or representative by the Radiologist Assistant, and communication documented in the PACS or zVision Dashboard. Electronically Signed   By: Julian Hy M.D.   On: 09/29/2015 15:37   Dg Abd Acute W/chest  09/27/2015  CLINICAL DATA:  Abdominal pain and bloating for several days. EXAM: DG ABDOMEN ACUTE W/ 1V CHEST COMPARISON:  None. FINDINGS: There is no evidence of free intraperitoneal air. There are dilated loops of small bowel in the left upper quadrant measuring up to 5 cm. There is air within the colon. No radiopaque calculi or other significant radiographic abnormality is seen. Heart size and mediastinal contours are within normal limits. The lungs are hyperinflated likely secondary to COPD. IMPRESSION: Dilated loops of small bowel in the left upper quadrant measuring up to 5 cm with a air in the colon. This may reflect an ileus versus partial small bowel obstruction. Electronically Signed   By: Kathreen Devoid   On: 09/27/2015 17:33   US Abdomen Limited Ruq  09/29/2015  CLINICAL DATA:  Cholelithiasis. EXAM: US ABDOMEN LIMITED - RIGHT UPPER QUADRANT COMPARISON:  September 27, 2015. FINDINGS: Gallbladder: 7 mm stone is seen in neck of gallbladder. No significant gallbladder wall thickening or pericholecystic fluid is noted. No sonographic Murphy's sign is noted. Common bile duct: Diameter: 3.3 mm which is within normal limits. Liver: No focal lesion identified. Within normal limits in parenchymal echogenicity. Right hydronephrosis is noted as described on prior CT scan. IMPRESSION: Solitary gallstone is noted. No definite evidence of cholecystitis is noted. Right hydronephrosis is again noted as described on prior CT scan. Electronically Signed   By: Marijo Conception, M.D.   On: 09/29/2015 10:39    Blood pressure 145/81, pulse 81, temperature 98 F (36.7 C), temperature source Oral, resp. rate 18, height _0  (1.676 m), weight 86.8 kg (191 lb 5.8 oz), SpO2 99 %.   physical exam  general: The patient is in no acute distress.  Not a great historian but answers questions appropriately.  He just finished supper HEENT.  Atraumatic.  Normocephalic.   Sclera clear Neck: No adenopathy or mass Lungs: On nasal O2.  No increased work of breathing.  No wheezes or rhonchi.  Breath sounds are very distant. Heart: Regular rate and rhythm Abdomen:  Soft.  Distended.  Nontender.  Somewhat tympanitic.  No mass.  Tiny umbilical hernia, nontender.                                           Fat-containing by CT. Extremity is: No edema.  Palpable pedal pulses Muscle skeletal: No redness or warmth.  Full range of motion.  No joint deformities Neuro: Alert and oriented.  Muscle strength seems symmetrical Psychiatric: Not psychotic.  Not suicidal.  Alert.  Cooperative.  Short and Long-term memory seems slightly impaired.   Assessment/Plan:    Abdominal distention and bloating.  It is not clear whether this is related to biliary tract disease or due to a motility disorder related to significant inactivity     -will repeat abdominal x-rays now just to make  sure the gas pattern is returning to normal  7 mm gallstone without radiographic or clinical evidence of inflammation.     -Nonvisualization of hepatobiliary scan may indicate cholecystitis or could be a false positive due to inactivity.  Normal lab work, absence of pain, excellent appetite at present are somewhat confusing.  Marland Kitchen  He is not acutely ill.  Will hold off of recommendations for cholecystectomy at this point, repeat lab work tomorrow.  IV Rocephin seems reasonable, but from the gallbladder standpoint it is still empiric.  I suspect he is a poor risk for general anesthesia and abdominal surgery.  End-stage COPD.  - It is not clear whether he can tolerate general anesthesia and come off the ventilator, given his very limited performance status  I discussed this with Dr. Maryland Pink and he is going to get pulmonary medicine to to see if they think he could tolerate general anesthesia and a cholecystectomy.  This would be extremely helpful as we worked through algorithms of care.  If he is not a candidate for  surgery then a percutaneous cholecystostomy is an option.  Paroxysmal atrial fibrillation of Xarelto.  Xarelto appears to be on hold diabetes mellitus type 2.   Continue Lantus.  SSI Hypertension Thrombocytopenia without bleeding episodes.  Latest platelet count 102.K. Tobacco abuse    -PCP recently counseled about the importance of quitting Depression   -On Zoloft  Nimah Uphoff M 09/29/2015, 5:07 PM

## 2015-09-29 NOTE — Progress Notes (Signed)
[facility] PROGRESS NOTE  Brett Michael NUU:[id number] DOB: 03-02-1941 DOA: 09/27/2015  PCP: Tommy Rainwater, MD  Brief HPI: 75 year old Caucasian male with past medical history as stated below, presented with abdominal bloating and increased urinary frequency. He was complaining of burping and belching. He was also complaining of heartburn with regurgitation of food. In the emergency department CT scan revealed severe right-sided hydronephrosis with renal calculations. Patient was hospitalized for further management.  Past medical history:  Past Medical History  Diagnosis Date  . Diabetes mellitus without complication (HCC)   . COPD (chronic obstructive pulmonary disease) (HCC)   . Hypertension   . Renal disorder     kidney stones  . CVA (cerebral infarction)   . Aortic stenosis     moderate by echo 10/2013  . Paroxysmal atrial fibrillation [facility])     Consultants: Urology  Procedures: None  Antibiotics: Ceftriaxone  Subjective: Patient continues to have bloating sensation in his abdomen. He did not do well with his diet yesterday. He had a few episodes of nausea and one or 2 episodes of vomiting. His son and daughter-in-law are at the bedside.   Objective: Vital Signs  Filed Vitals:   09/28/15 1525 09/28/15 2031 09/28/15 2155 09/29/15 0556  BP:  143/76  148/70  Pulse:  91  78  Temp:  98.7 F (37.1 C)  98 F (36.7 C)  TempSrc:  Oral  Oral  Resp:  22  20  Height:      Weight:      SpO2: 93% 94% 96% 99%    Intake/Output Summary (Last 24 hours) at 09/29/15 0941 Last data filed at 09/29/15 0901  Gross per 24 hour  Intake    890 ml  Output   1925 ml  Net  -1035 ml   Filed Weights   09/27/15 1616 09/28/15 0121  Weight: 86.183 kg (190 lb) 86.8 kg (191 lb 5.8 oz)    General appearance: alert, cooperative, appears stated age, no distress and moderately obese Resp: Course breath sounds bilaterally. No crackles, no wheezing. Cardio: regular  rate and rhythm, S1, S2 normal, no murmur, click, rub or gallop GI: soft, remains non-tender; nospecifically nontender in the right upper quadrant. bowel sounds normal; no masses,  no organomegaly Extremities: extremities normal, atraumatic, no cyanosis or edema Neurologic: Awake and alert. Oriented 3. No focal neurological deficits.  Lab Results:  Basic Metabolic Panel:  Recent Labs Lab 09/27/15 1803 09/28/15 0355 09/29/15 0436  NA 138 140 142  K 4.3 4.6 4.3  CL 102 99* 105  CO2 30 31 30   GLUCOSE 168* 302* 236*  BUN 16 15 17   CREATININE 0.73 0.89 0.95  CALCIUM 8.7* 8.5* 8.4*   Liver Function Tests:  Recent Labs Lab 09/27/15 1803 09/28/15 0355 09/29/15 0436  AST 13* 12* 16  ALT 23 20 21   ALKPHOS 63 61 52  BILITOT 0.7 0.7 0.5  PROT 6.7 6.3* 5.6*  ALBUMIN 3.7 3.5 3.1*    Recent Labs Lab 09/27/15 1803  LIPASE 25   CBC:  Recent Labs Lab 09/27/15 1803 09/28/15 0355 09/29/15 0436  WBC 7.2 4.7 5.8  NEUTROABS 6.0  --   --   HGB 12.1* 11.5* 11.3*  HCT 37.7* 35.7* 35.6*  MCV 91.1 92.0 93.0  PLT 105* 100* 102*   Cardiac Enzymes:  Recent Labs Lab 09/27/15 1803  TROPONINI <0.03   BNP (last 3 results)  Recent Labs  09/03/15 2156 09/27/15 1803  BNP 77.9 72.4  CBG:  Recent Labs Lab 09/28/15 0735 09/28/15 1134 09/28/15 1636 09/28/15 2306 09/29/15 0729  GLUCAP 236* 191* 165* 214* 206*    No results found for this or any previous visit (from the past 240 hour(s)).    Studies/Results: Ct Abdomen Pelvis Wo Contrast  09/27/2015  CLINICAL DATA:  Abdominal distention for 2-3 days.  Pain. EXAM: CT ABDOMEN AND PELVIS WITHOUT CONTRAST TECHNIQUE: Multidetector CT imaging of the abdomen and pelvis was performed following the standard protocol without IV contrast. COMPARISON:  08/12/2015 FINDINGS: Lower chest: Mild bibasilar chronic interstitial disease. Normal heart size. Hepatobiliary: Normal liver. Cholelithiasis. No intrahepatic or extrahepatic  biliary ductal dilatation. Pancreas: Normal. Spleen: Normal. Adrenals/Urinary Tract: Normal adrenal glands. Severe right hydronephrosis with severe right renal cortical thinning. Normal left kidney. There is a right ureteral calcification measuring 2.7 mm in transverse dimension and 2.8 cm in craniocaudal dimension. Decompressed bladder. Stomach/Bowel: No bowel wall thickening or dilatation. Fact containing right inguinal hernia. Diverticulosis without evidence of diverticulitis. No pneumatosis, pneumoperitoneum or portal venous gas. Small fat containing umbilical hernia. Vascular/Lymphatic: Normal caliber abdominal aorta. No lymphadenopathy. Other: No fluid collection or hematoma. Musculoskeletal: No lytic or sclerotic osseous lesion. 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 IMPRESSION: 1. Severe right hydronephrosis with severe right renal cortical thinning. There is a right mid ureteral calcification measuring 2.7 mm in transverse dimension and 2.8 cm in craniocaudal dimension. 2. 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 3. Cholelithiasis. Electronically Signed   By: Elige Ko   On: 09/27/2015 18:58   Dg Abd Acute W/chest  09/27/2015  CLINICAL DATA:  Abdominal pain and bloating for several days. EXAM: DG ABDOMEN ACUTE W/ 1V CHEST COMPARISON:  None. FINDINGS: There is no evidence of free intraperitoneal air. There are dilated loops of small bowel in the left upper quadrant measuring up to 5 cm. There is air within the colon. No radiopaque calculi or other significant radiographic abnormality is seen. Heart size and mediastinal contours are within normal limits. The lungs are hyperinflated likely secondary to COPD. IMPRESSION: Dilated loops of small bowel in the left upper quadrant  measuring up to 5 cm with a air in the colon. This may reflect an ileus versus partial small bowel obstruction. Electronically Signed   By: Elige Ko   On: 09/27/2015 17:33    Medications:  Scheduled: . amLODipine  10 mg Oral Daily  . atorvastatin  10 mg Oral q1800  . beta carotene w/minerals  1 tablet Oral BID  . cefTRIAXone (ROCEPHIN)  IV  1 g Intravenous Q24H  . Fluticasone-Salmeterol  1 puff Inhalation BID  . guaiFENesin  600 mg Oral BID  . insulin aspart  0-9 Units Subcutaneous TID WC  . insulin glargine  11 Units Subcutaneous QHS  . ipratropium-albuterol  3 mL Nebulization TID  . losartan  100 mg Oral Daily  . multivitamin  5 mL Oral Daily  . nicotine  21 mg Transdermal Daily  . pantoprazole  40 mg Oral BID AC  . sertraline  100 mg Oral Daily  . sodium chloride  3 mL Intravenous Q12H  . tiotropium  18 mcg Inhalation Daily   Continuous: . sodium chloride Stopped (09/29/15 0930)   ZOX:[id number] **OR** acetaminophen, albuterol, alum & mag hydroxide-simeth, ondansetron, sodium chloride  Assessment/Plan:  Principal Problem:   Abdominal bloating  Active Problems:   Stroke [facility](HCC)   Essential hypertension   Diabetes mellitus without complication (HCC)   COPD (chronic obstructive pulmonary disease) (HCC)   Tobacco use disorder   Aortic stenosis due to bicuspid aortic valve   Interatrial septal aneurysm with PFO   Paroxysmal atrial fibrillation (HCC)   Chronic respiratory failure with hypoxia (HCC)   Depression   HLD (hyperlipidemia)   Hydroureteronephrosis   UTI (lower urinary tract infection)   Kidney stone    Abdominal bloating Ultrasound of the abdomen did not suggest cholecystitis. His symptoms persist despite use of PPI. Proceed with HIDA scan. HIDA scan result was reviewed. Concern was raised regarding acute cholecystitis. However, patient is nontender in the right upper quadrant. His symptoms could still be due to a biliary issues. Consult general surgery.  Discussed with patient's family. His symptoms are most likely unrelated to the hydronephrosis detected on CT scan. LFTs are normal.   Severe right hydronephrosis CT abdomen/pelvis has no bowel obstruction, but showed old severe right hydronephrosis with severe right renal cortical thinning and right mid ureteral calcification. Urology was consulted. These changes are chronic. Patient's renal function is normal. He has good urine output. No indication for acute intervention.  UTI Continue ceftriaxone. Await urine cultures  History of COPD Patient is oxygen dependent and wears 6 L nasal cannula oxygen at baseline. He does not seem to have acute exacerbation. X-ray of acute abd/chest did not show infiltration. Continue home medications. Continue nebulizers. We will need to consult pulmonology if gallbladder surgery is considered.   Paroxysmal atrial fibrillation on Xarelto Heart rate is reasonably well controlled. Chadvasc score 4. Currently on anticoagulation. Hold anticoagulation for now.  Diabetes mellitus type 2 Last hemoglobin A1c on record was noted to be 6.7 in 10/22/2013. Patient is taking Januvia, metformin and Lantus.  Essential Hypertension Continue amlodipine and losartan  Thrombocytopenia Platelet count noted to be 106, no bleeding tendency. Continue to monitor by CBC  Depression Stable. Continue home medications: Zoloft  Tobacco abuse Nicotine patch  DVT Prophylaxis: On anticoagulation    Code Status: Full code  Family Communication: Discussed with the patient and his son and daughter-in-law  Disposition Plan: Await general surgery input    LOS: 2 days   Clifton-Fine HospitalKRISHNAN,Sigmond Patalano  [facility] Pager [phone] 09/29/2015, 9:41 AM  If 7PM-7AM, please contact night-coverage at [email or web address], password [email or web address]

## 2015-09-29 NOTE — Progress Notes (Signed)
Received telephone call from radiology with results of nuclear medicine hepatobiliary imaging:  Results: nonvisualization of gallbladder, suspicious for acute cholecystitis  Barrie LymeVance, Matisse Salais E RN 3:52 PM  09/29/2015

## 2015-09-29 NOTE — Progress Notes (Signed)
Inpatient Diabetes Program Recommendations  AACE/ADA: New Consensus Statement on Inpatient Glycemic Control (2015)  Target Ranges:  Prepandial:   less than 140 mg/dL      Peak postprandial:   less than 180 mg/dL (1-2 hours)      Critically ill patients:  140 - 180 mg/dL   Results for Brett Michael, Brett Michael (MRN [id number]) as of 09/29/2015 10:30  Ref. Range 09/28/2015 07:35 09/28/2015 11:34 09/28/2015 16:36 09/28/2015 23:06  Glucose-Capillary Latest Ref Range: 65-99 mg/dL 409236 (H) 811191 (H) 914165 (H) 214 (H)    Results for Brett Michael, Brett Michael (MRN [id number]) as of 09/29/2015 10:30  Ref. Range 09/29/2015 07:29  Glucose-Capillary Latest Ref Range: 65-99 mg/dL 086206 (H)    Admit with: Abdominal bloating and severe right hydronephrosis   History: DM, CVA, COPD  Home DM Meds: Januvia 100 mg daily       Metformin 500 mg bid       Lantus 20 units QHS  Current Insulin Orders: Lantus 11 units QHS      Novolog Sensitive SSI (0-9 units) TID AC     MD- If patient is not discharged home today, please consider increasing Lantus to 15 units QHS since fasting glucose elevated this AM     --Will follow patient during hospitalization--  Ambrose FinlandJeannine Johnston Lauryn Lizardi RN, MSN, CDE Diabetes Coordinator Inpatient Glycemic Control Team Team Pager: [phone] (8a-5p)

## 2015-09-30 ENCOUNTER — Encounter: Payer: Self-pay | Admitting: *Deleted

## 2015-09-30 ENCOUNTER — Other Ambulatory Visit: Payer: Self-pay | Admitting: *Deleted

## 2015-09-30 ENCOUNTER — Ambulatory Visit: Payer: PPO | Admitting: Adult Health

## 2015-09-30 DIAGNOSIS — J432 Centrilobular emphysema: Secondary | ICD-10-CM

## 2015-09-30 DIAGNOSIS — J9611 Chronic respiratory failure with hypoxia: Secondary | ICD-10-CM

## 2015-09-30 DIAGNOSIS — K810 Acute cholecystitis: Secondary | ICD-10-CM

## 2015-09-30 DIAGNOSIS — Z01811 Encounter for preprocedural respiratory examination: Secondary | ICD-10-CM

## 2015-09-30 DIAGNOSIS — K3189 Other diseases of stomach and duodenum: Secondary | ICD-10-CM | POA: Insufficient documentation

## 2015-09-30 LAB — COMPREHENSIVE METABOLIC PANEL
ALBUMIN: 3 g/dL — AB (ref 3.5–5.0)
ALK PHOS: 57 U/L (ref 38–126)
ALT: 21 U/L (ref 17–63)
ANION GAP: 10 (ref 5–15)
AST: 12 U/L — AB (ref 15–41)
BUN: 15 mg/dL (ref 6–20)
CALCIUM: 8.5 mg/dL — AB (ref 8.9–10.3)
CO2: 33 mmol/L — AB (ref 22–32)
CREATININE: 0.86 mg/dL (ref 0.61–1.24)
Chloride: 100 mmol/L — ABNORMAL LOW (ref 101–111)
GFR calc Af Amer: >60 mL/min (ref >60)
GFR calc non Af Amer: >60 mL/min (ref >60)
GLUCOSE: 179 mg/dL — AB (ref 65–99)
Potassium: 3.9 mmol/L (ref 3.5–5.1)
SODIUM: 143 mmol/L (ref 135–145)
Total Bilirubin: 0.6 mg/dL (ref 0.3–1.2)
Total Protein: 5.5 g/dL — ABNORMAL LOW (ref 6.5–8.1)

## 2015-09-30 LAB — CBC WITH DIFFERENTIAL/PLATELET
Basophils Absolute: 0 10*3/uL (ref 0.0–0.1)
Basophils Relative: 0 %
EOS PCT: 4 %
Eosinophils Absolute: 0.2 10*3/uL (ref 0.0–0.7)
HEMATOCRIT: 34.2 % — AB (ref 39.0–52.0)
HEMOGLOBIN: 10.9 g/dL — AB (ref 13.0–17.0)
LYMPHS ABS: 0.7 10*3/uL (ref 0.7–4.0)
LYMPHS PCT: 14 %
MCH: 29.6 pg (ref 26.0–34.0)
MCHC: 31.9 g/dL (ref 30.0–36.0)
MCV: 92.9 fL (ref 78.0–100.0)
MONOS PCT: 6 %
Monocytes Absolute: 0.3 10*3/uL (ref 0.1–1.0)
NEUTROS PCT: 76 %
Neutro Abs: 3.9 10*3/uL (ref 1.7–7.7)
PLATELETS: 104 10*3/uL — AB (ref 150–400)
RBC: 3.68 MIL/uL — AB (ref 4.22–5.81)
RDW: 16.3 % — ABNORMAL HIGH (ref 11.5–15.5)
WBC: 5.1 10*3/uL (ref 4.0–10.5)

## 2015-09-30 LAB — GLUCOSE, CAPILLARY: Glucose-Capillary: 304 mg/dL — ABNORMAL HIGH (ref 65–99)

## 2015-09-30 MED ORDER — METOCLOPRAMIDE HCL 5 MG/ML IJ SOLN
5.0000 mg | Freq: Three times a day (TID) | INTRAMUSCULAR | Status: DC
  Administered 2015-09-30 – 2015-10-01 (×3): 5 mg via INTRAVENOUS
  Filled 2015-09-30 (×3): qty 2

## 2015-09-30 MED ORDER — ALBUTEROL SULFATE (2.5 MG/3ML) 0.083% IN NEBU
2.5000 mg | INHALATION_SOLUTION | Freq: Three times a day (TID) | RESPIRATORY_TRACT | Status: DC
  Administered 2015-09-30 – 2015-10-02 (×6): 2.5 mg via RESPIRATORY_TRACT
  Filled 2015-09-30 (×6): qty 3

## 2015-09-30 NOTE — Consult Note (Signed)
   [facility] CM Inpatient Consult   09/30/2015  Brett Michael 11/12/1940 [id number]     Came to visit patient on behalf of [employer] Management program. Multiple attempts have been made by Telephonic [facility] RNCM to reach patient and/or daughter to engage patient for [place] Management services prior to admission. Please see chart review tab then notes in EPIC for [facility] correspondence. Met with patient and daughter at bedside to explain [place] Management program services again and to obtain written consent.  Patient's daughter, Olivia Mackie states she would rather call [facility] after hospital discharge and talk to more with patient about services prior to giving consent for [facility] to follow at this time. She politely declines Probation officer requesting [place] follow up and [facility] services at this time. Left [facility] Care Management packet along with contact information for her to call if they reconsider. Made [facility] Telephonic RNCM aware of bedside conversation with patient and daughter. Will make inpatient RNCM aware as well.   Marthenia Rolling, MSN-Ed, RN,BSN [facility] Liaison [phone]

## 2015-09-30 NOTE — Evaluation (Signed)
Physical Therapy Evaluation Patient Details Name: Brett Michael MRN: [id number] DOB: 09-11-1941 Today's Date: 09/30/2015   History of Present Illness  75 yo male former smoker developed progressive abdominal pain, nausea, and bloating. He was admitted and evaluated by surgery. CT abd showed cholelithiasis and Rt hydronephrosis, and confirmed on Abd u/s. He had HIDA scan which was concerning for acute cholecystitis  PMH includes CVA, aortic stenosis, COPD, DM, HTN, afib  Clinical Impression  Pt admitted with above diagnosis. Pt currently with functional limitations due to the deficits listed below (see PT Problem List).  Pt will benefit from skilled PT to increase their independence and safety with mobility to allow discharge to the venue listed below.   Pt assisted into bathroom and with 3/4 dyspnea upon returning to recliner.  Daughter reports pt has granddaughter with him everyday from 9-2 and pt lives in a building behind her home.  Daughter declines HHPT and states family has been and can continue assisting pt at home.  Will continue to assist pt with mobilizing in acute care.     Follow Up Recommendations No PT follow up;Supervision/Assistance - 24 hour    Equipment Recommendations  None recommended by PT    Recommendations for Other Services       Precautions / Restrictions Precautions Precautions: Fall Restrictions Weight Bearing Restrictions: No      Mobility  Bed Mobility Overal bed mobility: Needs Assistance Bed Mobility: Supine to Sit     Supine to sit: Supervision     General bed mobility comments: HOB elevated, no verbal cues or physical assist required, supervision for safety with lines  Transfers Overall transfer level: Needs assistance Equipment used: None Transfers: Sit to/from Stand Sit to Stand: Min guard         General transfer comment: cues for safety as pt rushing (needed to use bathroom) and lines not yet  ready  Ambulation/Gait Ambulation/Gait assistance: Min guard Ambulation Distance (Feet): 16 Feet Assistive device: None Gait Pattern/deviations: Step-through pattern;Decreased stride length     General Gait Details: ambulate to/from bathroom, dyspnea 3/4 after using bathroom and ambulating back to recliner, remained on 6L O2 (pt denies increasing O2 L for activity however family states he sometimes needs 8L in the morning) 3-4 min recovery  Stairs            Wheelchair Mobility    Modified Rankin (Stroke Patients Only)       Balance                                             Pertinent Vitals/Pain Pain Assessment: No/denies pain    Home Living Family/patient expects to be discharged to:: Private residence Living Arrangements: Alone Available Help at Discharge: Family;Available 24 hours/day Type of Home: House Home Access: Stairs to enter Entrance Stairs-Rails: Lawyer of Steps: 3 Home Layout: One level Home Equipment: Cane - single point Additional Comments: Pt has his own house on the same property as his family.    Prior Function Level of Independence: Independent         Comments: chronic 6L O2 [place]     Hand Dominance        Extremity/Trunk Assessment               Lower Extremity Assessment: Overall WFL for tasks assessed  Communication   Communication: No difficulties  Cognition Arousal/Alertness: Awake/alert Behavior During Therapy: Flat affect Overall Cognitive Status: Within Functional Limits for tasks assessed                      General Comments      Exercises        Assessment/Plan    PT Assessment Patient needs continued PT services  PT Diagnosis Difficulty walking   PT Problem List Decreased activity tolerance;Decreased mobility;Decreased knowledge of use of DME;Decreased safety awareness;Cardiopulmonary status limiting activity  PT Treatment  Interventions DME instruction;Gait training;Stair training;Functional mobility training;Therapeutic activities;Therapeutic exercise;Balance training;Neuromuscular re-education;Patient/family education   PT Goals (Current goals can be found in the Care Plan section) Acute Rehab PT Goals PT Goal Formulation: With patient/family Time For Goal Achievement: 10/07/15 Potential to Achieve Goals: Good    Frequency Min 3X/week   Barriers to discharge        Co-evaluation               End of Session Equipment Utilized During Treatment: Oxygen Activity Tolerance: Patient limited by fatigue Patient left: in chair;with call bell/phone within reach;with family/visitor present Nurse Communication: Mobility status         Time: 1610-9604 PT Time Calculation (min) (ACUTE ONLY): 19 min   Charges:   PT Evaluation $PT Eval Low Complexity: 1 Procedure     PT G Codes:        Esli Jernigan,KATHrine E 09/30/2015, 12:04 PM Zenovia Jarred, PT, DPT 09/30/2015 Pager: [phone]

## 2015-09-30 NOTE — Patient Outreach (Signed)
[facility]) Care Management  09/30/2015  Brett Michael [birth year] [id number]  Subjective: Telephone call to patient's home number, spoke with patient and HIPAA verified.  Patient gave verbal authorization for RNCM to speak with patient's daughter-in-law Brett Michael), regarding his healthcare needs.   Patient states his daughter-in-law takes care of everything for him.   Daughter-in-law states patient is currently in the hospital.   Discussed [facility] Care Management services and daughter-in-law in agreement to receive visit from [facility] Liaison to learn more about the program and receive program packet.    Left voicemail message for [facility] Liaison and requested call back regarding new referral. Telephone call from [facility] states she has spoken with patient and daughter-in-law, who have declined [facility] Care Management services at this time.  Patient's daughter-in-law accepted [facility] program information and will call [facility] if services needed in the future.    Objective: Per Epic case review: Patient's discharge diagnosis from 09/03/15 - 12-26/16 hospitalization: respiratory failure, depression, hyperlipidemia, COPD exacerbation, diabetes, hypertension, and possible PNA. Patient also oxygen dependent. Patient to follow up with primary care MD, Dr. Lonzo Cloud and pulmonologist Dr. Delton Coombes within 1 week of discharge from hospital. Per [facility] of Care Recommendation report: patient has had 1 ED visit and 1 inpatient admit. Patient was given flu vaccine on 06/24/15. Patient's primary care MD is Dr. Harrington Challenger.   Assessment: Referral received :09/25/15. Referral date : 09/22/15. Referral from [facility] for COPD. Per referral patient has advanced COPD and is currently on 4 -6 liters of oxygen. Patient had recent hospitalization and discharged on 09/10/15. Referral source requesting [facility] services.   Patient currently inpatient for COPD  exacerbation.  Patient and daughter-in-law has declined [facility] Care Management services at this time.  Patient's daughter will contact [facility] if services needed in the future.    Plan: RNCM will notify patient's primary MD of case closure due refusal of care management services. RNCM will notify Damita Rhodie at [facility] Care Management to close case due refusal of care management services.    Maitland Lesiak H. Gardiner Barefoot, BSN, CCM [facility] Care Management [facility] Telephonic CM Phone: [phone]

## 2015-09-30 NOTE — Evaluation (Signed)
Occupational Therapy Evaluation Patient Details Name: Brett Michael MRN: [id number] DOB: 04/26/1941 Today's Date: 09/30/2015    History of Present Illness 75 yo male former smoker developed progressive abdominal pain, nausea, and bloating. He was admitted and evaluated by surgery. CT abd showed cholelithiasis and Rt hydronephrosis, and confirmed on Abd u/s. He had HIDA scan which was concerning for acute cholecystitis  PMH includes CVA, aortic stenosis, COPD, DM, HTN, afib   Clinical Impression   Pt was admitted for the above. He is near baseline and familywill assist as needed.  no further OT is needed at this time.    Follow Up Recommendations  No OT follow up    Equipment Recommendations  3 in 1 bedside comode    Recommendations for Other Services       Precautions / Restrictions Precautions Precautions: Fall Restrictions Weight Bearing Restrictions: No      Mobility Bed Mobility Overal bed mobility: Needs Assistance Bed Mobility: Supine to Sit     Supine to sit: Supervision     General bed mobility comments: for lines.  Pt has a sleep number bed and the [facility] raises  Transfers Overall transfer level: Needs assistance Equipment used: None Transfers: Sit to/from Stand Sit to Stand: Supervision         General transfer comment: for safety/lines    Balance                                            ADL Overall ADL's : At baseline                                       General ADL Comments: near baseline.  Pt gets to bathroom himself--furniture walks.  He verbalizes understanding of energy conservation.  He would like a 3:1 to use by his bed     Vision     Perception     Praxis      Pertinent Vitals/Pain Pain Assessment: No/denies pain     Hand Dominance     Extremity/Trunk Assessment Upper Extremity Assessment Upper Extremity Assessment: Overall WFL for tasks assessed          Communication  Communication Communication: No difficulties   Cognition Arousal/Alertness: Awake/alert Behavior During Therapy: WFL for tasks assessed/performed Overall Cognitive Status: Within Functional Limits for tasks assessed                     General Comments       Exercises       Shoulder Instructions      Home Living Family/patient expects to be discharged to:: Private residence Living Arrangements: Alone Available Help at Discharge: Family;Available 24 hours/day Type of Home: House             Bathroom Shower/Tub: Tub/shower unit Shower/tub characteristics: Engineer, building services: Standard     Home Equipment: Shower seat   Additional Comments: Pt has his own house on the same property as his family.      Prior Functioning/Environment Level of Independence: Needs assistance        Comments: granddaughter assists with adls/meals.  Pt warms something up for dinner    OT Diagnosis: Generalized weakness   OT Problem List:     OT Treatment/Interventions:  OT Goals(Current goals can be found in the care plan section) Acute Rehab OT Goals Patient Stated Goal: none stated OT Goal Formulation: All assessment and education complete, DC therapy  OT Frequency:     Barriers to D/C:            Co-evaluation              End of Session    Activity Tolerance: Patient tolerated treatment well Patient left: in bed;with call bell/phone within reach   Time: 1505-1520 OT Time Calculation (min): 15 min Charges:  OT General Charges $OT Visit: 1 Procedure OT Evaluation $OT Eval Low Complexity: 1 Procedure G-Codes:    Elise Gladden 18-Oct-2015, 3:38 PM   Marica Otter, OTR/L [phone] 18-Oct-2015

## 2015-09-30 NOTE — Progress Notes (Signed)
[facility] PROGRESS NOTE  GOVANI RADLOFF ZOX:[id number] DOB: [birth year] DOA: 09/27/2015  PCP: Tommy Rainwater, MD  Brief HPI: 75 year old Caucasian male with past medical history as stated below, presented with abdominal bloating and increased urinary frequency. He was complaining of burping and belching. He was also complaining of heartburn with regurgitation of food. In the emergency department CT scan revealed severe right-sided hydronephrosis with renal calculations. Patient was hospitalized for further management. Patient underwent biliary workup, which was equivocal. General surgery was consulted. Subsequently, gastroenterology was also consulted.  Past medical history:  Past Medical History  Diagnosis Date  . Diabetes mellitus without complication (HCC)   . COPD (chronic obstructive pulmonary disease) (HCC)   . Hypertension   . Renal disorder     kidney stones  . CVA (cerebral infarction)   . Aortic stenosis     moderate by echo 10/2013  . Paroxysmal atrial fibrillation [facility])     Consultants: Urology. General surgery. Pulmonology. Gastroenterology  Procedures: None  Antibiotics: Ceftriaxone  Subjective: Patient feels about the same. Continues to have abdominal bloating. Denies any nausea, vomiting yesterday. His daughter-in-law is at the bedside.   Objective: Vital Signs  Filed Vitals:   09/29/15 1526 09/29/15 2007 09/29/15 2104 09/30/15 0433  BP: 145/81  117/63 141/63  Pulse: 81  78 73  Temp: 98 F (36.7 C)  98 F (36.7 C) 98.2 F (36.8 C)  TempSrc: Oral  Oral Oral  Resp: 18  24 20   Height:      Weight:      SpO2:  97% 93% 95%    Intake/Output Summary (Last 24 hours) at 09/30/15 0954 Last data filed at 09/30/15 0740  Gross per 24 hour  Intake 917.17 ml  Output   1425 ml  Net -507.83 ml   Filed Weights   09/27/15 1616 09/28/15 0121  Weight: 86.183 kg (190 lb) 86.8 kg (191 lb 5.8 oz)    General appearance: alert, cooperative,  appears stated age, and moderately obese Resp: Course breath sounds bilaterally. No crackles, no wheezing. Cardio: regular rate and rhythm, S1, S2 normal, no murmur, click, rub or gallop GI: soft, slightly distended. remains non-tender; specifically nontender in the right upper quadrant. bowel sounds normal; no masses,  no organomegaly Extremities: extremities normal, atraumatic, no cyanosis or edema Neurologic: Awake and alert. Oriented 3. No focal neurological deficits.  Lab Results:  Basic Metabolic Panel:  Recent Labs Lab 09/27/15 1803 09/28/15 0355 09/29/15 0436 09/30/15 0456  NA 138 140 142 143  K 4.3 4.6 4.3 3.9  CL 102 99* 105 100*  CO2 30 31 30  33*  GLUCOSE 168* 302* 236* 179*  BUN 16 15 17 15   CREATININE 0.73 0.89 0.95 0.86  CALCIUM 8.7* 8.5* 8.4* 8.5*   Liver Function Tests:  Recent Labs Lab 09/27/15 1803 09/28/15 0355 09/29/15 0436 09/30/15 0456  AST 13* 12* 16 12*  ALT 23 20 21 21   ALKPHOS 63 61 52 57  BILITOT 0.7 0.7 0.5 0.6  PROT 6.7 6.3* 5.6* 5.5*  ALBUMIN 3.7 3.5 3.1* 3.0*    Recent Labs Lab 09/27/15 1803  LIPASE 25   CBC:  Recent Labs Lab 09/27/15 1803 09/28/15 0355 09/29/15 0436 09/30/15 0456  WBC 7.2 4.7 5.8 5.1  NEUTROABS 6.0  --   --  3.9  HGB 12.1* 11.5* 11.3* 10.9*  HCT 37.7* 35.7* 35.6* 34.2*  MCV 91.1 92.0 93.0 92.9  PLT 105* 100* 102* 104*   Cardiac Enzymes:  Recent Labs  Lab 09/27/15 1803  TROPONINI <0.03   BNP (last 3 results)  Recent Labs  09/03/15 2156 09/27/15 1803  BNP 77.9 72.4    CBG:  Recent Labs Lab 09/28/15 2306 09/29/15 0729 09/29/15 1153 09/29/15 1640 09/29/15 2102  GLUCAP 214* 206* 151* 210* 287*    Recent Results (from the past 240 hour(s))  Urine culture     Status: None   Collection Time: 09/27/15  7:20 PM  Result Value Ref Range Status   Specimen Description URINE, CLEAN CATCH  Final   Special Requests NONE  Final   Culture   Final    MULTIPLE SPECIES PRESENT, SUGGEST  RECOLLECTION Performed at [facility]    Report Status 09/29/2015 FINAL  Final  Culture, blood (Routine X 2) w Reflex to ID Panel     Status: None (Preliminary result)   Collection Time: 09/28/15  3:50 AM  Result Value Ref Range Status   Specimen Description BLOOD RIGHT ARM  Final   Special Requests BOTTLES DRAWN AEROBIC AND ANAEROBIC 6CC  Final   Culture   Final    NO GROWTH 1 DAY Performed at [facility]    Report Status PENDING  Incomplete  Culture, blood (Routine X 2) w Reflex to ID Panel     Status: None (Preliminary result)   Collection Time: 09/28/15  3:55 AM  Result Value Ref Range Status   Specimen Description BLOOD RIGHT HAND  Final   Special Requests IN PEDIATRIC BOTTLE 4CC  Final   Culture   Final    NO GROWTH 1 DAY Performed at [facility]    Report Status PENDING  Incomplete      Studies/Results: Nm Hepatobiliary Liver Func  09/29/2015  CLINICAL DATA:  Abdominal bloating, reflux, dyspepsia. Cholelithiasis on abdominal ultrasound. EXAM: NUCLEAR MEDICINE HEPATOBILIARY IMAGING TECHNIQUE: Sequential images of the abdomen were obtained out to 60 minutes following intravenous administration of radiopharmaceutical. RADIOPHARMACEUTICALS:  6.16 mCi Tc-22m  Choletec IV COMPARISON:  Abdominal ultrasound dated 09/29/2015. FINDINGS: Normal hepatic excretion. Visualization of small bowel within 10-15 minutes. Nonvisualization of the gallbladder at 1 hour. 3.47 mg morphine was administered IV. Nonvisualization of the gallbladder after 1 hour post morphine administration, suspicious for acute cholecystitis. IMPRESSION: Nonvisualization of the gallbladder, suspicious for acute cholecystitis. These results will be called to the ordering clinician or representative by the Radiologist Assistant, and communication documented in the PACS or zVision Dashboard. Electronically Signed   By: Charline Bills M.D.   On: 09/29/2015 15:37   Dg Abd 2 Views  09/29/2015   CLINICAL DATA:  Abdominal pain and bloating. EXAM: ABDOMEN - 2 VIEW COMPARISON:  CT of the abdomen pelvis 09/27/2015 FINDINGS: The bowel gas pattern is nonobstructive/nonspecific. Single air-fluid level containing gas distended structure within the upper mid abdomen likely represents the stomach. There is normal colonic bowel gas pattern. There is no evidence of free air. No radio-opaque calculi or other significant radiographic abnormality is seen. IMPRESSION: Probable distention of the stomach, with nonspecific appearance. Otherwise no evidence of small-bowel obstruction. Electronically Signed   By: Ted Mcalpine M.D.   On: 09/29/2015 19:22   US Abdomen Limited Ruq  09/29/2015  CLINICAL DATA:  Cholelithiasis. EXAM: US ABDOMEN LIMITED - RIGHT UPPER QUADRANT COMPARISON:  September 27, 2015. FINDINGS: Gallbladder: 7 mm stone is seen in neck of gallbladder. No significant gallbladder wall thickening or pericholecystic fluid is noted. No sonographic Murphy's sign is noted. Common bile duct: Diameter: 3.3 mm which is within normal limits.  Liver: No focal lesion identified. Within normal limits in parenchymal echogenicity. Right hydronephrosis is noted as described on prior CT scan. IMPRESSION: Solitary gallstone is noted. No definite evidence of cholecystitis is noted. Right hydronephrosis is again noted as described on prior CT scan. Electronically Signed   By: Lupita Raider, M.D.   On: 09/29/2015 10:39    Medications:  Scheduled: . amLODipine  10 mg Oral Daily  . atorvastatin  10 mg Oral q1800  . beta carotene w/minerals  1 tablet Oral BID  . cefTRIAXone (ROCEPHIN)  IV  1 g Intravenous Q24H  . Fluticasone-Salmeterol  1 puff Inhalation BID  . guaiFENesin  600 mg Oral BID  . insulin aspart  0-9 Units Subcutaneous TID WC  . insulin glargine  11 Units Subcutaneous QHS  . ipratropium-albuterol  3 mL Nebulization TID  . losartan  100 mg Oral Daily  . multivitamin  5 mL Oral Daily  . nicotine  21 mg  Transdermal Daily  . pantoprazole  40 mg Oral BID AC  . sertraline  100 mg Oral Daily  . sodium chloride  3 mL Intravenous Q12H  . tiotropium  18 mcg Inhalation Daily   Continuous: . sodium chloride 10 mL/hr at 09/29/15 1802   NWG:[id number] **OR** acetaminophen, albuterol, ALPRAZolam, alum & mag hydroxide-simeth, ondansetron, sodium chloride  Assessment/Plan:  Principal Problem:   Abdominal bloating Active Problems:   Stroke [facility])   Essential hypertension   Diabetes mellitus without complication (HCC)   COPD (chronic obstructive pulmonary disease) (HCC)   Tobacco use disorder   Aortic stenosis due to bicuspid aortic valve   Interatrial septal aneurysm with PFO   Paroxysmal atrial fibrillation (HCC)   Chronic respiratory failure with hypoxia (HCC)   Depression   HLD (hyperlipidemia)   Hydroureteronephrosis   UTI (lower urinary tract infection)   Kidney stone    Abdominal bloating/cholelithiasis Ultrasound of the abdomen did not suggest cholecystitis. His symptoms persist despite use of PPI. HIDA scan result was reviewed. Concern was raised regarding acute cholecystitis. However, patient is nontender in the right upper quadrant. General surgery was consulted. Abdominal films were obtained which shows gastric distention. Patient was also seen by pulmonology to ascertain his risk to be able to undergo any kind of surgical intervention. They recommend that the patient NOT undergo elective surgery. The only option for him would be a percutaneous drain. However it is not even clear if his symptoms are due to the gallbladder or not. So we will request input from gastroenterology in view of the gastric distention noted on x-ray. Discussed with Dr. Derrell Lolling with surgery. His symptoms are most likely unrelated to the hydronephrosis detected on CT scan. LFTs are normal.   Severe right hydronephrosis CT abdomen/pelvis has no bowel obstruction, but showed old severe right hydronephrosis with  severe right renal cortical thinning and right mid ureteral calcification. Urology was consulted. These changes are chronic. Patient's renal function is normal. He has good urine output. No indication for acute intervention.  UTI Continue ceftriaxone. No growth on urine culture.  History of COPD Patient is oxygen dependent and wears 6 L nasal cannula oxygen at baseline. He does not seem to have acute exacerbation. X-ray of chest did not show infiltration. Continue home medications. Continue nebulizers. Not a good candidate for surgical intervention due to his severe COPD.  Paroxysmal atrial fibrillation on Xarelto Heart rate is reasonably well controlled. Chadvasc score 4. Currently on anticoagulation. Hold anticoagulation for now.  Diabetes mellitus type 2 Last  hemoglobin A1c on record was noted to be 6.7 in 10/22/2013. Patient is taking Januvia, metformin and Lantus.  Essential Hypertension Continue amlodipine and losartan  Thrombocytopenia Platelet count noted to be 106, no bleeding tendency. Continue to monitor by CBC  Depression Stable. Continue home medications: Zoloft  Tobacco abuse Nicotine patch  DVT Prophylaxis: On anticoagulation    Code Status: Full code  Family Communication: Discussed with the patient and his son and daughter-in-law  Disposition Plan: Await gastroenterology input. Mobilize as tolerated.   LOS: 3 days   [facility]  [facility] Pager [phone] 09/30/2015, 9:54 AM  If 7PM-7AM, please contact night-coverage at [email or web address], password [facility]

## 2015-09-30 NOTE — Care Management Important Message (Signed)
Important Message  Patient Details  Name: Brett Michael MRN: [id number] Date of Birth: Feb 23, 1941   Medicare Important Message Given:  Yes    Haskell Flirt 09/30/2015, 10:10 AMImportant Message  Patient Details  Name: Brett Michael MRN: [id number] Date of Birth: 02-Jun-1941   Medicare Important Message Given:  Yes    Haskell Flirt 09/30/2015, 10:08 AM

## 2015-09-30 NOTE — Consult Note (Signed)
Referring Provider:  [facility] Primary Care Physician:  Shamleffer, Landry Mellow, MD Primary Gastroenterologist:  unassigned  Reason for Consultation:   Abdominal pain, bloating,  cholelithiasis  HPI: Brett Michael is a 75 y.o. male a history of paroxysmal atrial fibrillation on Xarelto (last dose last Friday), moderate Aortic stenosis, CVA, renal stones, hypertension, diabetes mellitus, and oxygen-dependent COPD for which she is on 6 L of oxygen at baseline at home. He presented to the emergency room in January with complaints of postprandial nausea and abdominal bloating. His daughter-in-law and granddaughter, who are with him his bedside, report that he has been complaining of early satiety since October. He has been eating less because he often feels queasy after he eats. He has not had any abdominal pain. He has not vomited. He has not been constipated. His activity level has been decreasing over the past several months due to his respiratory status, and he has been gaining weight. He had spoken to his primary care provider about this and was started on invokana. Apparently he tried invokana about 2 weeks and felt very nauseous and so he discontinued it 2 weeks ago. Despite stopping it, he continued to feel very full and bloated. He has been getting several episodes of ascitic regurgitation, and his daughter purchased Prilosec for him to try last Saturday morning, but he ended  up being admitted later that day. His daughter-in-law reports that his most recent A1c on 06/23/2015 at [facility] was 7.2. She also reports that he completed 3 stool cards on December 6 that were developed by equal and all were negative.  In the emergency room patient had abdominal films and chest films that revealed dilated loops of small bowel in the left upper quadrant measuring up to 5 cm with air in the colon. He had an abdominal/pelvic CT that showed no bowel dilation but showed severe right hydronephrosis  with severe right cortical thinning. He was evaluated by urology and it was felt that the atrophic kidney and some reflection of long-standing hydronephrosis. He had an ultrasound of the abdomen the 16th that showed a solitary gallstone but no evidence of cholecystitis. He had a HIDA scan showed nonvisualization of the gallbladder suspicious for acute cholecystitis. Repeat abdominal films showed a single air-fluid level continue gas-distended structure within the upper midabdomen likely representing. Normal colonic bowel gas pattern. No evidence of free air. No evidence of small bowel obstruction. LFTs have remained normal. Patient denies epigastric pain or right upper quadrant pain. He was evaluated by general surgery to determine whether he had acute cholecystitis or whether he would benefit from a cholecystostomy or cholecystectomy. Surgery felt that in the absence of pain, and with normal lab work and a good appetite and they would hold off on cholecystectomy. He was evaluated by pulmonary and found that with his poor respiratory status he would not be a surgical candidate because of upper abdominal surgery may cause further diaphragmatic dysfunction in a patient has no pulmonary reserve. Patient currently denying abdominal pain. He states he just feels full after a few bites and then feels full for hours afterwards even if he is only in a small meal. He has had 3 loose bowel movements today.   Past Medical History  Diagnosis Date  . Diabetes mellitus without complication (HCC)   . COPD (chronic obstructive pulmonary disease) (HCC)   . Hypertension   . Renal disorder     kidney stones  . CVA (cerebral infarction)   . Aortic stenosis  moderate by echo 10/2013  . Paroxysmal atrial fibrillation [facility])     Past Surgical History  Procedure Laterality Date  . Tee without cardioversion N/A 10/23/2013    Procedure: TRANSESOPHAGEAL ECHOCARDIOGRAM (TEE);  Surgeon: [name], MD;  Location: [facility]  ENDOSCOPY;  Service: Cardiovascular;  Laterality: N/A;  . 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 Authorizing Provider  albuterol (PROVENTIL HFA;VENTOLIN HFA) 108 (90 BASE) MCG/ACT inhaler Inhale 1 puff into the lungs every 6 (six) hours as needed for wheezing or shortness of breath. 04/23/14  Yes [name], MD  albuterol (PROVENTIL) (2.5 MG/3ML) 0.083% nebulizer solution Take 3 mLs (2.5 mg total) by nebulization every 2 (two) hours as needed for wheezing or shortness of breath. 09/08/15  Yes [name], DO  amLODipine (NORVASC) 10 MG tablet Take 10 mg by mouth daily.   Yes Historical Provider, MD  atorvastatin (LIPITOR) 20 MG tablet Take 0.5 tablets (10 mg total) by mouth daily at 6 PM. KEEP OV. 03/18/15  Yes [name], MD  benzonatate (TESSALON) 200 MG capsule Take 1 capsule (200 mg total) by mouth 2 (two) times daily as needed for cough. 09/08/15  Yes [name], DO  beta carotene w/minerals (OCUVITE) tablet Take 1 tablet by mouth 2 (two) times daily.   Yes Historical Provider, MD  Fluticasone-Salmeterol (ADVAIR) 250-50 MCG/DOSE AEPB Inhale 1 puff into the lungs 2 (two) times daily.   Yes Historical Provider, MD  guaiFENesin (MUCINEX) 600 MG 12 hr tablet Take 1 tablet (600 mg total) by mouth 2 (two) times daily. 09/08/15  Yes [name], DO  insulin glargine (LANTUS) 100 UNIT/ML injection Inject 20 Units into the skin at bedtime.    Yes Historical Provider, MD  ipratropium-albuterol (DUONEB) 0.5-2.5 (3) MG/3ML SOLN Take 3 mLs by nebulization 3 (three) times daily. 09/08/15  Yes [name], DO  losartan (COZAAR) 100 MG tablet Take 100 mg by mouth daily.   Yes Historical Provider, MD  metFORMIN (GLUCOPHAGE) 500 MG tablet Take 1 tablet (500 mg total) by mouth 2 (two) times daily with a meal. 10/23/13  Yes [name], MD  Multiple  Vitamins-Minerals (MULTIVITAMIN PO) Take 1 tablet by mouth daily.   Yes Historical Provider, MD  nicotine (NICODERM CQ - DOSED IN MG/24 HOURS) 21 mg/24hr patch Place 1 patch (21 mg total) onto the skin daily. 09/08/15  Yes [name], DO  omeprazole (PRILOSEC) 20 MG capsule Take 20 mg by mouth daily.   Yes Historical Provider, MD  OXYGEN-HELIUM IN Inhale 6 L into the lungs continuous.    Yes Historical Provider, MD  rivaroxaban (XARELTO) 20 MG TABS tablet Take 1 tablet (20 mg total) by mouth daily with supper. 01/23/15  Yes [name], MD  sertraline (ZOLOFT) 100 MG tablet Take 100 mg by mouth daily.   Yes Historical Provider, MD  sitaGLIPtin (JANUVIA) 100 MG tablet Take 100 mg by mouth daily.   Yes Historical Provider, MD  Spacer/Aero-Holding Chambers (AEROCHAMBER MINI CHAMBER) DEVI Use as directed 10/31/13  Yes [name], MD  tiotropium (SPIRIVA) 18 MCG inhalation capsule Place 18 mcg into inhaler and inhale daily.   Yes Historical Provider, MD    Current Facility-Administered Medications  Medication Dose Route Frequency Provider Last Rate Last Dose  . 0.9 %  sodium chloride infusion   Intravenous Continuous Osvaldo Shipper, MD 10  mL/hr at 09/29/15 1802    . acetaminophen (TYLENOL) tablet 650 mg  650 mg Oral Q6H PRN Lorretta Harp, MD       Or  . acetaminophen (TYLENOL) suppository 650 mg  650 mg Rectal Q6H PRN Lorretta Harp, MD      . albuterol (PROVENTIL) (2.5 MG/3ML) 0.083% nebulizer solution 2.5 mg  2.5 mg Nebulization TID Coralyn Helling, MD   2.5 mg at 09/30/15 1018  . albuterol (PROVENTIL) (2.5 MG/3ML) 0.083% nebulizer solution 5 mg  5 mg Nebulization Q2H PRN Coralyn Helling, MD      . ALPRAZolam Prudy Feeler) tablet 0.5 mg  0.5 mg Oral TID PRN Osvaldo Shipper, MD   0.5 mg at 09/29/15 1843  . alum & mag hydroxide-simeth (MAALOX/MYLANTA) 200-200-20 MG/5ML suspension 30 mL  30 mL Oral Q6H PRN Osvaldo Shipper, MD   30 mL at 09/28/15 1757  . amLODipine (NORVASC) tablet 10 mg  10 mg Oral Daily Lorretta Harp, MD    10 mg at 09/30/15 0933  . atorvastatin (LIPITOR) tablet 10 mg  10 mg Oral q1800 Lorretta Harp, MD   10 mg at 09/29/15 1713  . beta carotene w/minerals (OCUVITE) tablet 1 tablet  1 tablet Oral BID Lorretta Harp, MD   1 tablet at 09/30/15 0935  . cefTRIAXone (ROCEPHIN) 1 g in dextrose 5 % 50 mL IVPB  1 g Intravenous Q24H Lorretta Harp, MD   1 g at 09/29/15 2024  . Fluticasone-Salmeterol (ADVAIR) 250-50 MCG/DOSE inhaler 1 puff  1 puff Inhalation BID Osvaldo Shipper, MD   1 puff at 09/30/15 1010  . guaiFENesin (MUCINEX) 12 hr tablet 600 mg  600 mg Oral BID Lorretta Harp, MD   600 mg at 09/30/15 0934  . insulin aspart (novoLOG) injection 0-9 Units  0-9 Units Subcutaneous TID WC Lorretta Harp, MD   2 Units at 09/30/15 0935  . insulin glargine (LANTUS) injection 11 Units  11 Units Subcutaneous QHS Lorretta Harp, MD   11 Units at 09/29/15 2212  . losartan (COZAAR) tablet 100 mg  100 mg Oral Daily Lorretta Harp, MD   100 mg at 09/30/15 0935  . multivitamin liquid 5 mL  5 mL Oral Daily Lorretta Harp, MD   5 mL at 09/30/15 0933  . nicotine (NICODERM CQ - dosed in mg/24 hours) patch 21 mg  21 mg Transdermal Daily Lorretta Harp, MD   21 mg at 09/30/15 0933  . ondansetron (ZOFRAN) injection 4 mg  4 mg Intravenous Q8H PRN Lorretta Harp, MD   4 mg at 09/28/15 1517  . pantoprazole (PROTONIX) EC tablet 40 mg  40 mg Oral BID AC Osvaldo Shipper, MD   40 mg at 09/30/15 0934  . sertraline (ZOLOFT) tablet 100 mg  100 mg Oral Daily Lorretta Harp, MD   100 mg at 09/30/15 0934  . sodium chloride (OCEAN) 0.65 % nasal spray 1 spray  1 spray Each Nare PRN Rolan Lipa, NP   1 spray at 09/29/15 0256  . sodium chloride 0.9 % injection 3 mL  3 mL Intravenous Q12H Lorretta Harp, MD   3 mL at 09/29/15 1530  . tiotropium (SPIRIVA) inhalation capsule 18 mcg  18 mcg Inhalation Daily Lorretta Harp, MD   18 mcg at 09/30/15 1011    Allergies as of 09/27/2015 - Review Complete 09/27/2015  Allergen Reaction Noted  . Symbicort [budesonide-formoterol fumarate] Anaphylaxis and  Swelling 01/29/2015    Family History  Problem Relation Age of Onset  . COPD Father   .  COPD Brother     Social History   Social History  . Marital Status: Widowed    Spouse Name: N/A  . Number of Children: N/A  . Years of Education: N/A   Occupational History  . Not on file.   Social History Main Topics  . Smoking status: Former Smoker -- 1.00 packs/day for 30 years    Types: Cigarettes    Quit date: 09/14/2003  . Smokeless tobacco: Current User    Types: Chew  . Alcohol Use: No  . Drug Use: Not on file  . Sexual Activity: Not on file   Other Topics Concern  . Not on file   Social History Narrative    Review of Systems: Gen: Denies any fever, chills, sweats, anorexia, fatigue, weakness, malaise, weight loss, and sleep disorder CV: Denies chest pain, angina, palpitations, syncope, orthopnea, PND, peripheral edema, and claudication. Resp: Has frequent productive cough and dyspnea GI: Denies vomiting blood, jaundice, and fecal incontinence.   Denies dysphagia or odynophagia. Has abdominal bloating and early satiety GU : Denies urinary burning, blood in urine, urinary frequency, urinary hesitancy, nocturnal urination, and urinary incontinence. MS: Denies joint pain, limitation of movement, and swelling, stiffness, low back pain, extremity pain. Denies muscle weakness, cramps, atrophy.  Derm: Denies rash, itching, dry skin, hives, moles, warts, or unhealing ulcers.  Psych: Denies depression, anxiety, memory loss, suicidal ideation, hallucinations, paranoia, and confusion. Heme: Denies bruising, bleeding, and enlarged lymph nodes. Neuro:  Denies any headaches, dizziness, paresthesias. Endo:  Denies any problems with DM, thyroid, adrenal function.  Physical Exam: Vital signs in last 24 hours: Temp:  [98 F (36.7 C)-98.2 F (36.8 C)] 98.2 F (36.8 C) (01/17 0433) Pulse Rate:  [73-81] 73 (01/17 0433) Resp:  [18-24] 20 (01/17 0433) BP: (117-159)/(63-81) 141/63 mmHg  (01/17 0433) SpO2:  [93 %-97 %] 96 % (01/17 1020) Last BM Date: 09/29/15 General:   Alert,  Well-developed, lying in bed in no apparent distress Head:  Normocephalic and atraumatic. Eyes:  Sclera clear, no icterus. Conjunctiva pink. Ears:  Normal auditory acuity. Nose:  No deformity, discharge,  or lesions. Mouth:  No deformity or lesions.   Neck:  Supple; no masses or thyromegaly. Lungs:  Diminished breath sounds throughout . Heart:  Regular rate and rhythm Abdomen:  Soft, mild distention, mild tenderness to palpation in epigastric and periumbilical area, right bowel sounds Rectal:  Deferred  Msk:  Symmetrical without gross deformities. . Neurologic:  Alert and  oriented x4;  grossly normal neurologically. Skin:  Intact without significant lesions or rashes.. Psych: Alert and cooperative. Normal mood and affect.  Intake/Output from previous day: 01/16 0701 - 01/17 0700 In: 917.2 [P.O.:720; I.V.:147.2; IV Piggyback:50] Out: 1375 [Urine:1375] Intake/Output this shift: Total I/O In: 240 [P.O.:240] Out: 200 [Urine:200]  Lab Results:  Recent Labs  09/28/15 0355 09/29/15 0436 09/30/15 0456  WBC 4.7 5.8 5.1  HGB 11.5* 11.3* 10.9*  HCT 35.7* 35.6* 34.2*  PLT 100* 102* 104*   BMET  Recent Labs  09/28/15 0355 09/29/15 0436 09/30/15 0456  NA 140 142 143  K 4.6 4.3 3.9  CL 99* 105 100*  CO2 31 30 33*  GLUCOSE 302* 236* 179*  BUN 15 17 15   CREATININE 0.89 0.95 0.86  CALCIUM 8.5* 8.4* 8.5*   LFT  Recent Labs  09/28/15 0355  09/30/15 0456  PROT 6.3*  < > 5.5*  ALBUMIN 3.5  < > 3.0*  AST 12*  < > 12*  ALT 20  < >  21  ALKPHOS 61  < > 57  BILITOT 0.7  < > 0.6  BILIDIR <0.1*  --   --   IBILI NOT CALCULATED  --   --   < > = values in this interval not displayed. PT/INR  Recent Labs  09/28/15 0355  LABPROT 14.0  INR 1.06    Studies/Results: Nm Hepatobiliary Liver Func  09/29/2015  CLINICAL DATA:  Abdominal bloating, reflux, dyspepsia. Cholelithiasis on  abdominal ultrasound. EXAM: NUCLEAR MEDICINE HEPATOBILIARY IMAGING TECHNIQUE: Sequential images of the abdomen were obtained out to 60 minutes following intravenous administration of radiopharmaceutical. RADIOPHARMACEUTICALS:  6.16 mCi Tc-54m  Choletec IV COMPARISON:  Abdominal ultrasound dated 09/29/2015. FINDINGS: Normal hepatic excretion. Visualization of small bowel within 10-15 minutes. Nonvisualization of the gallbladder at 1 hour. 3.47 mg morphine was administered IV. Nonvisualization of the gallbladder after 1 hour post morphine administration, suspicious for acute cholecystitis. IMPRESSION: Nonvisualization of the gallbladder, suspicious for acute cholecystitis. These results will be called to the ordering clinician or representative by the Radiologist Assistant, and communication documented in the PACS or zVision Dashboard. Electronically Signed   By: Charline Bills M.D.   On: 09/29/2015 15:37   Dg Abd 2 Views  09/29/2015  CLINICAL DATA:  Abdominal pain and bloating. EXAM: ABDOMEN - 2 VIEW COMPARISON:  CT of the abdomen pelvis 09/27/2015 FINDINGS: The bowel gas pattern is nonobstructive/nonspecific. Single air-fluid level containing gas distended structure within the upper mid abdomen likely represents the stomach. There is normal colonic bowel gas pattern. There is no evidence of free air. No radio-opaque calculi or other significant radiographic abnormality is seen. IMPRESSION: Probable distention of the stomach, with nonspecific appearance. Otherwise no evidence of small-bowel obstruction. Electronically Signed   By: Ted Mcalpine M.D.   On: 09/29/2015 19:22   US Abdomen Limited Ruq  09/29/2015  CLINICAL DATA:  Cholelithiasis. EXAM: US ABDOMEN LIMITED - RIGHT UPPER QUADRANT COMPARISON:  September 27, 2015. FINDINGS: Gallbladder: 7 mm stone is seen in neck of gallbladder. No significant gallbladder wall thickening or pericholecystic fluid is noted. No sonographic Murphy's sign is noted.  Common bile duct: Diameter: 3.3 mm which is within normal limits. Liver: No focal lesion identified. Within normal limits in parenchymal echogenicity. Right hydronephrosis is noted as described on prior CT scan. IMPRESSION: Solitary gallstone is noted. No definite evidence of cholecystitis is noted. Right hydronephrosis is again noted as described on prior CT scan. Electronically Signed   By: Lupita Raider, M.D.   On: 09/29/2015 10:39    IMPRESSION/PLAN: 75 year old male with a history and COPD, paroxysmal atrial fibrillation, aortic stenosis, CVA, renal stones, hypertension, and diabetes, admitted with abdominal bloating and belching. He has been experiencing early satiety and some postprandial dyspepsia for several months. Admission labs reveal normal LFTs, normal CBC, normal lipase, abdominal x-ray showed dilated loops of small bowel in the left upper quadrant measuring up to 5 cm with air in the colon. CT abdomen showed no bowel dilatation but severe right hydronephrosis. CT also showed a small gallstone in the right upper quadrant. Ultrasound showed a 7 mm stone in the neck of the gallbladder but no gallbladder wall thickening. Common bile duct 3.3 mm. HIDA scan with nonvisualization of the gallbladder, suspicious for cholecystitis.Symptoms may be functional in nature/gastroparesis.COPD pts often swallow a lot of air as well. Continue bid PPI. Will give trial of low dose reglan IV. Low fat, small volume feedings. Id symptoms not improving,  Can check UGI, but at this time not a  great candidate for any procedures. Will review with Dr Lataisha Colan asLeone Payorfurther recommendations.     Hvozdovic, Moise Boring 09/30/2015,  Pager [phone]  Mon-Fri 8a-5p [phone] after 5p, weekends, holidays      [facility] Attending   I have taken an interval history, reviewed the chart and examined the patient. I agree with the Advanced Practitioner's note, impression and recommendations.   My ideas as to his problems  with bloating and early satiety are   1) autonomic gut dysfx 2) ? Partial gastric outlet obstruction 3) Ingestion of air - Sx increase correlates with higher O2 flow 4) med side effects though he has stopped Invokana and Januvia on hold  Will see if IV Reglan helps.  Daughter-in-law hoping that he doesn't "bounce right back into hospital after dc' - seems like his overall status is declining and there may be more of that regardless.  Iva Boop, MD, [facility] [phone] (pager) [phone] after 5 PM, weekends and holidays  09/30/2015 2:15 PM

## 2015-09-30 NOTE — Consult Note (Signed)
PCCM PROGRESS NOTE  ADMISSION DATE: 09/27/2015 CONSULT DATE: 09/30/2015 REFERRING PROVIDER: Dr. Rito Ehrlich, [facility]  CC: Abdominal pain  HISTORY OF PRESENT ILLNESS: 74 yo male former smoker developed progressive abdominal pain, nausea, and bloating.  He was admitted and evaluated by surgery.  CT abd showed cholelithiasis and Rt hydronephrosis, and confirmed on Abd u/s.  He had HIDA scan which was concerning for acute cholecystitis.  With these findings he is being considered for cholecystectomy versus cholecystostomy.  Pulmonary consultation requested to assess his fitness for surgery.  He is followed by Dr. 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  His family reports that he does very minimal activity >> bed to chair to table.  If he ever leaves his home it is usual to go to the doctor; otherwise he doesn't get out of the car.  He uses anywhere from 5 to 8 liters oxygen.  His oxygen levels can drop into the 70's if he stands up and walks more than a few feet.  He has cough with clear sputum.  He will get episodes of wheezing.  PAST MEDICAL HISTORY: He  has a past medical history of Diabetes mellitus without complication (HCC); COPD (chronic obstructive pulmonary disease) (HCC); Hypertension; Renal disorder; CVA (cerebral infarction); Aortic stenosis; and Paroxysmal atrial fibrillation (HCC).  PAST SURGICAL HISTORY: He  has past surgical history that includes TEE without cardioversion (N/A, 10/23/2013) and loop recorder implant (N/A, 10/23/2013).  FAMILY HISTORY: His family history includes COPD in his brother and father.  SOCIAL HISTORY: He  reports that he quit smoking about 12 years ago. His smoking use included Cigarettes. He has a 30 pack-year smoking history. His smokeless tobacco use includes Chew. He reports that he does not drink alcohol.  Allergies  Allergen Reactions  . Symbicort [Budesonide-Formoterol Fumarate] Anaphylaxis and  Swelling    Tongue swells    No current facility-administered medications on file prior to encounter.   Current Outpatient Prescriptions on File Prior to Encounter  Medication Sig  . albuterol (PROVENTIL HFA;VENTOLIN HFA) 108 (90 BASE) MCG/ACT inhaler Inhale 1 puff into the lungs every 6 (six) hours as needed for wheezing or shortness of breath.  Marland Kitchen albuterol (PROVENTIL) (2.5 MG/3ML) 0.083% nebulizer solution Take 3 mLs (2.5 mg total) by nebulization every 2 (two) hours as needed for wheezing or shortness of breath.  Marland Kitchen amLODipine (NORVASC) 10 MG tablet Take 10 mg by mouth daily.  Marland Kitchen atorvastatin (LIPITOR) 20 MG tablet Take 0.5 tablets (10 mg total) by mouth daily at 6 PM. KEEP OV.  . benzonatate (TESSALON) 200 MG capsule Take 1 capsule (200 mg total) by mouth 2 (two) times daily as needed for cough.  . beta carotene w/minerals (OCUVITE) tablet Take 1 tablet by mouth 2 (two) times daily.  . Fluticasone-Salmeterol (ADVAIR) 250-50 MCG/DOSE AEPB Inhale 1 puff into the lungs 2 (two) times daily.  Marland Kitchen guaiFENesin (MUCINEX) 600 MG 12 hr tablet Take 1 tablet (600 mg total) by mouth 2 (two) times daily.  . insulin glargine (LANTUS) 100 UNIT/ML injection Inject 20 Units into the skin at bedtime.   Marland Kitchen ipratropium-albuterol (DUONEB) 0.5-2.5 (3) MG/3ML SOLN Take 3 mLs by nebulization 3 (three) times daily.  Marland Kitchen losartan (COZAAR) 100 MG tablet Take 100 mg by mouth daily.  . metFORMIN (GLUCOPHAGE) 500 MG tablet Take 1 tablet (500 mg total) by mouth 2 (two) times daily with a meal.  . Multiple Vitamins-Minerals (MULTIVITAMIN PO) Take 1 tablet by mouth daily.  Marland Kitchen  nicotine (NICODERM CQ - DOSED IN MG/24 HOURS) 21 mg/24hr patch Place 1 patch (21 mg total) onto the skin daily.  . OXYGEN-HELIUM IN Inhale 6 L into the lungs continuous.   . rivaroxaban (XARELTO) 20 MG TABS tablet Take 1 tablet (20 mg total) by mouth daily with supper.  . sertraline (ZOLOFT) 100 MG tablet Take 100 mg by mouth daily.  . sitaGLIPtin (JANUVIA)  100 MG tablet Take 100 mg by mouth daily.  Marland Kitchen Spacer/Aero-Holding Chambers (AEROCHAMBER MINI CHAMBER) DEVI Use as directed  . tiotropium (SPIRIVA) 18 MCG inhalation capsule Place 18 mcg into inhaler and inhale daily.    REVIEW OF SYSTEMS: C/o bloating and nausea.  Denies chest pain.  Had 2 BMs since yesterday.    SUBJECTIVE: Feels bloated  VITALS SIGNS: BP 141/63 mmHg  Pulse 73  Temp(Src) 98.2 F (36.8 C) (Oral)  Resp 20  Ht  (1.676 m)  Wt 191 lb 5.8 oz (86.8 kg)  BMI 30.90 kg/m2  SpO2 95%  INTAKE/OUTPUT: I/O last 3 completed shifts: In: 967.2 [P.O.:720; I.V.:147.2; IV Piggyback:100] Out: 2350 [Urine:2350]  General: sitting in chair HEENT: wears glasses, no sinus tenderness, no LAN Cardiac: regular, no murmur Chest: decreased BS, poor air movement, no wheeze Abd: mild distention, increased tympany, non tender Ext: no edema Neuro: alert, normal strength Skin: no rashes   CMP Latest Ref Rng 09/30/2015 09/29/2015 09/28/2015  Glucose 65 - 99 mg/dL 409(W) 119(J) 478(G)  BUN 6 - 20 mg/dL Creatinine 0.61 - 1.24 mg/dL 9.56 2.13 0.86  Sodium 135 - 145 mmol/L 143 142 140  Potassium 3.5 - 5.1 mmol/L 3.9 4.3 4.6  Chloride 101 - 111 mmol/L 100(L) 105 99(L)  CO2 22 - 32 mmol/L 33(H) 30 31  Calcium 8.9 - 10.3 mg/dL 5.7(Q) 4.6(N) 6.2(X)  Total Protein 6.5 - 8.1 g/dL 5.2(W) 5.6(L) 6.3(L)  Total Bilirubin 0.3 - 1.2 mg/dL 0.6 0.5 0.7  Alkaline Phos 38 - 126 U/L 57 52 61  AST 15 - 41 U/L 12(L) 16 12(L)  ALT 17 - 63 U/L CBC Latest Ref Rng 09/30/2015 09/29/2015 09/28/2015  WBC 4.0 - 10.5 K/uL 5.1 5.8 4.7  Hemoglobin 13.0 - 17.0 g/dL 10.9(L) 11.3(L) 11.5(L)  Hematocrit 39.0 - 52.0 % 34.2(L) 35.6(L) 35.7(L)  Platelets 150 - 400 K/uL 104(L) 102(L) 100(L)     Nm Hepatobiliary Liver Func  09/29/2015  CLINICAL DATA:  Abdominal bloating, reflux, dyspepsia. Cholelithiasis on abdominal ultrasound. EXAM: NUCLEAR MEDICINE HEPATOBILIARY IMAGING TECHNIQUE: Sequential  images of the abdomen were obtained out to 60 minutes following intravenous administration of radiopharmaceutical. RADIOPHARMACEUTICALS:  6.16 mCi Tc-34m  Choletec IV COMPARISON:  Abdominal ultrasound dated 09/29/2015. FINDINGS: Normal hepatic excretion. Visualization of small bowel within 10-15 minutes. Nonvisualization of the gallbladder at 1 hour. 3.47 mg morphine was administered IV. Nonvisualization of the gallbladder after 1 hour post morphine administration, suspicious for acute cholecystitis. IMPRESSION: Nonvisualization of the gallbladder, suspicious for acute cholecystitis. These results will be called to the ordering clinician or representative by the Radiologist Assistant, and communication documented in the PACS or zVision Dashboard. Electronically Signed   By: Charline Bills M.D.   On: 09/29/2015 15:37   Dg Abd 2 Views  09/29/2015  CLINICAL DATA:  Abdominal pain and bloating. EXAM: ABDOMEN - 2 VIEW COMPARISON:  CT of the abdomen pelvis 09/27/2015 FINDINGS: The bowel gas pattern is nonobstructive/nonspecific. Single air-fluid level containing gas distended structure within the upper mid abdomen likely represents  the stomach. There is normal colonic bowel gas pattern. There is no evidence of free air. No radio-opaque calculi or other significant radiographic abnormality is seen. IMPRESSION: Probable distention of the stomach, with nonspecific appearance. Otherwise no evidence of small-bowel obstruction. Electronically Signed   By: Ted Mcalpine M.D.   On: 09/29/2015 19:22   US Abdomen Limited Ruq  09/29/2015  CLINICAL DATA:  Cholelithiasis. EXAM: US ABDOMEN LIMITED - RIGHT UPPER QUADRANT COMPARISON:  September 27, 2015. FINDINGS: Gallbladder: 7 mm stone is seen in neck of gallbladder. No significant gallbladder wall thickening or pericholecystic fluid is noted. No sonographic Murphy's sign is noted. Common bile duct: Diameter: 3.3 mm which is within normal limits. Liver: No focal lesion  identified. Within normal limits in parenchymal echogenicity. Right hydronephrosis is noted as described on prior CT scan. IMPRESSION: Solitary gallstone is noted. No definite evidence of cholecystitis is noted. Right hydronephrosis is again noted as described on prior CT scan. Electronically Signed   By: Lupita Raider, M.D.   On: 09/29/2015 10:39    DISCUSSION: 75 yo male former smoker with concern for acute cholecystitis.  Concern is whether is a candidate for surgical intervention given the severity of his lung function in the setting of GOLD 4 COPD/emphysema with chronic hypoxic respiratory failure needing 5 to 8 liters oxygen.  PFT from 06/06/15 >> FEV1 1.28 (56%), FEV% 56, TLC 4.36 (74%), DLCO 29%, +BD.  I have explained to the patient and his family that I am recommending against surgery.  My concern is that with upper abdominal surgery he will experiencing diaphragmatic dysfunction.  He has no pulmonary reserve.  He will most likely become ventilator dependent after surgery, and would likely never be liberated from the ventilator.  ASSESSMENT/PLAN:  Acute cholecystitis. Plan: - per surgery and primary team >> likely will proceed with cholecystostomy drainage  GOLD 4 COPD/emphysema. Plan: - continue spiriva, advair - prn albuterol >> no additional benefit with ipratropium while he is on spiriva - no indication for systemic steroids at this time  Chronic hypoxic respiratory failure. Plan: - adjust oxygen to keep SpO2 90 to 95%   PCCM can be available as needed.  Please call if further help needed while he is in hospital.  Coralyn Helling, MD [facility] Pulmonary/Critical Care 09/30/2015, 10:11 AM Pager:  [phone] After 3pm call: [phone]

## 2015-09-30 NOTE — Progress Notes (Signed)
Inpatient Diabetes Program Recommendations  AACE/ADA: New Consensus Statement on Inpatient Glycemic Control (2015)  Target Ranges:  Prepandial:   less than 140 mg/dL      Peak postprandial:   less than 180 mg/dL (1-2 hours)      Critically ill patients:  140 - 180 mg/dL    Results for DEARION, HUOT (MRN [id number]) as of 09/30/2015 10:05  Ref. Range 09/29/2015 07:29 09/29/2015 11:53 09/29/2015 16:40 09/29/2015 21:02  Glucose-Capillary Latest Ref Range: 65-99 mg/dL 409 (H) 811 (H) 914 (H) 287 (H)    Home DM Meds: Januvia 100 mg daily  Metformin 500 mg bid  Lantus 20 units QHS  Current Insulin Orders: Lantus 11 units QHS  Novolog Sensitive SSI (0-9 units) TID AC     MD- Please consider the following in-hospital insulin adjustments while patient's home oral DM meds on hold:  1. Increase Lantus to 15 units QHS   2. Start low dose Novolog Meal Coverage- Novolog 3 units tidwc    --Will follow patient during hospitalization--  Ambrose Finland RN, MSN, CDE Diabetes Coordinator Inpatient Glycemic Control Team Team Pager: [phone] (8a-5p)

## 2015-09-30 NOTE — Progress Notes (Signed)
Patient ID: Brett Michael, male   DOB: 1941-07-30, 75 y.o.   MRN: [id number]     [address] [place], [place] [address]    Phone: [phone] FAX: [phone]     Subjective: Pt ate breakfast, had a BM.  No pain, complains of belching which has been ongoing. WBC remains normal, afebrile, LFTs are normal.    Objective:  Vital signs:  Filed Vitals:   09/29/15 1526 09/29/15 2007 09/29/15 2104 09/30/15 0433  BP: 145/81  117/63 141/63  Pulse: 81  78 73  Temp: 98 F (36.7 C)  98 F (36.7 C) 98.2 F (36.8 C)  TempSrc: Oral  Oral Oral  Resp: _0 Height:      Weight:      SpO2:  97% 93% 95%    Last BM Date: 09/29/15  Intake/Output   Yesterday:  01/16 0701 - 01/17 0700 In: 917.2 [P.O.:720; I.V.:147.2; IV Piggyback:50] Out: 3664 [Urine:1375] This shift:  Total I/O In: -  Out: 200 [Urine:200]  Physical Exam: General: Pt awake/alert/oriented x4 in no acute distress Abdomen: Soft.  Distended. ttp periumbilical region and epigastrium.  Umbilical hernia, soft.    Problem List:   Principal Problem:   Abdominal bloating Active Problems:   Stroke [facility])   Essential hypertension   Diabetes mellitus without complication (HCC)   COPD (chronic obstructive pulmonary disease) (HCC)   Tobacco use disorder   Aortic stenosis due to bicuspid aortic valve   Interatrial septal aneurysm with PFO   Paroxysmal atrial fibrillation (HCC)   Chronic respiratory failure with hypoxia (HCC)   Depression   HLD (hyperlipidemia)   Hydroureteronephrosis   UTI (lower urinary tract infection)   Kidney stone    Results:   Labs: Results for orders placed or performed during the hospital encounter of 09/27/15 (from the past 48 hour(s))  Glucose, capillary     Status: Abnormal   Collection Time: 09/28/15 11:34 AM  Result Value Ref Range   Glucose-Capillary 191 (H) 65 - 99 mg/dL  Glucose, capillary     Status:  Abnormal   Collection Time: 09/28/15  4:36 PM  Result Value Ref Range   Glucose-Capillary 165 (H) 65 - 99 mg/dL   Comment 1 Notify RN    Comment 2 Document in Chart   Glucose, capillary     Status: Abnormal   Collection Time: 09/28/15 11:06 PM  Result Value Ref Range   Glucose-Capillary 214 (H) 65 - 99 mg/dL  CBC     Status: Abnormal   Collection Time: 09/29/15  4:36 AM  Result Value Ref Range   WBC 5.8 4.0 - 10.5 K/uL   RBC 3.83 (L) 4.22 - 5.81 MIL/uL   Hemoglobin 11.3 (L) 13.0 - 17.0 g/dL   HCT 35.6 (L) 39.0 - 52.0 %   MCV 93.0 78.0 - 100.0 fL   MCH 29.5 26.0 - 34.0 pg   MCHC 31.7 30.0 - 36.0 g/dL   RDW 16.2 (H) 11.5 - 15.5 %   Platelets 102 (L) 150 - 400 K/uL    Comment: CONSISTENT WITH PREVIOUS RESULT  Comprehensive metabolic panel     Status: Abnormal   Collection Time: 09/29/15  4:36 AM  Result Value Ref Range   Sodium 142 135 - 145 mmol/L   Potassium 4.3 3.5 - 5.1 mmol/L   Chloride 105 101 - 111 mmol/L   CO2 30 22 - 32 mmol/L  Glucose, Bld 236 (H) 65 - 99 mg/dL   BUN 17 6 - 20 mg/dL   Creatinine, Ser 0.95 0.61 - 1.24 mg/dL   Calcium 8.4 (L) 8.9 - 10.3 mg/dL   Total Protein 5.6 (L) 6.5 - 8.1 g/dL   Albumin 3.1 (L) 3.5 - 5.0 g/dL   AST 16 15 - 41 U/L   ALT 21 17 - 63 U/L   Alkaline Phosphatase 52 38 - 126 U/L   Total Bilirubin 0.5 0.3 - 1.2 mg/dL   GFR calc non Af Amer >60 >60 mL/min   GFR calc Af Amer >60 >60 mL/min    Comment: (NOTE) The eGFR has been calculated using the CKD EPI equation. This calculation has not been validated in all clinical situations. eGFR's persistently <60 mL/min signify possible Chronic Kidney Disease.    Anion gap 7 5 - 15  Glucose, capillary     Status: Abnormal   Collection Time: 09/29/15  7:29 AM  Result Value Ref Range   Glucose-Capillary 206 (H) 65 - 99 mg/dL  Glucose, capillary     Status: Abnormal   Collection Time: 09/29/15 11:53 AM  Result Value Ref Range   Glucose-Capillary 151 (H) 65 - 99 mg/dL  Glucose, capillary      Status: Abnormal   Collection Time: 09/29/15  4:40 PM  Result Value Ref Range   Glucose-Capillary 210 (H) 65 - 99 mg/dL  Glucose, capillary     Status: Abnormal   Collection Time: 09/29/15  9:02 PM  Result Value Ref Range   Glucose-Capillary 287 (H) 65 - 99 mg/dL  Comprehensive metabolic panel     Status: Abnormal   Collection Time: 09/30/15  4:56 AM  Result Value Ref Range   Sodium 143 135 - 145 mmol/L   Potassium 3.9 3.5 - 5.1 mmol/L   Chloride 100 (L) 101 - 111 mmol/L   CO2 33 (H) 22 - 32 mmol/L   Glucose, Bld 179 (H) 65 - 99 mg/dL   BUN 15 6 - 20 mg/dL   Creatinine, Ser 0.86 0.61 - 1.24 mg/dL   Calcium 8.5 (L) 8.9 - 10.3 mg/dL   Total Protein 5.5 (L) 6.5 - 8.1 g/dL   Albumin 3.0 (L) 3.5 - 5.0 g/dL   AST 12 (L) 15 - 41 U/L   ALT 21 17 - 63 U/L   Alkaline Phosphatase 57 38 - 126 U/L   Total Bilirubin 0.6 0.3 - 1.2 mg/dL   GFR calc non Af Amer >60 >60 mL/min   GFR calc Af Amer >60 >60 mL/min    Comment: (NOTE) The eGFR has been calculated using the CKD EPI equation. This calculation has not been validated in all clinical situations. eGFR's persistently <60 mL/min signify possible Chronic Kidney Disease.    Anion gap 10 5 - 15  CBC with Differential/Platelet     Status: Abnormal   Collection Time: 09/30/15  4:56 AM  Result Value Ref Range   WBC 5.1 4.0 - 10.5 K/uL   RBC 3.68 (L) 4.22 - 5.81 MIL/uL   Hemoglobin 10.9 (L) 13.0 - 17.0 g/dL   HCT 34.2 (L) 39.0 - 52.0 %   MCV 92.9 78.0 - 100.0 fL   MCH 29.6 26.0 - 34.0 pg   MCHC 31.9 30.0 - 36.0 g/dL   RDW 16.3 (H) 11.5 - 15.5 %   Platelets 104 (L) 150 - 400 K/uL    Comment: CONSISTENT WITH PREVIOUS RESULT   Neutrophils Relative % 76 %   Lymphocytes  Relative 14 %   Monocytes Relative 6 %   Eosinophils Relative 4 %   Basophils Relative 0 %   Neutro Abs 3.9 1.7 - 7.7 K/uL   Lymphs Abs 0.7 0.7 - 4.0 K/uL   Monocytes Absolute 0.3 0.1 - 1.0 K/uL   Eosinophils Absolute 0.2 0.0 - 0.7 K/uL   Basophils Absolute 0.0 0.0 -  0.1 K/uL    Imaging / Studies: Nm Hepatobiliary Liver Func  09/29/2015  CLINICAL DATA:  Abdominal bloating, reflux, dyspepsia. Cholelithiasis on abdominal ultrasound. EXAM: NUCLEAR MEDICINE HEPATOBILIARY IMAGING TECHNIQUE: Sequential images of the abdomen were obtained out to 60 minutes following intravenous administration of radiopharmaceutical. RADIOPHARMACEUTICALS:  6.16 mCi Tc-52m Choletec IV COMPARISON:  Abdominal ultrasound dated 09/29/2015. FINDINGS: Normal hepatic excretion. Visualization of small bowel within 10-15 minutes. Nonvisualization of the gallbladder at 1 hour. 3.47 mg morphine was administered IV. Nonvisualization of the gallbladder after 1 hour post morphine administration, suspicious for acute cholecystitis. IMPRESSION: Nonvisualization of the gallbladder, suspicious for acute cholecystitis. These results will be called to the ordering clinician or representative by the Radiologist Assistant, and communication documented in the PACS or zVision Dashboard. Electronically Signed   By: SJulian HyM.D.   On: 09/29/2015 15:37   Dg Abd 2 Views  09/29/2015  CLINICAL DATA:  Abdominal pain and bloating. EXAM: ABDOMEN - 2 VIEW COMPARISON:  CT of the abdomen pelvis 09/27/2015 FINDINGS: The bowel gas pattern is nonobstructive/nonspecific. Single air-fluid level containing gas distended structure within the upper mid abdomen likely represents the stomach. There is normal colonic bowel gas pattern. There is no evidence of free air. No radio-opaque calculi or other significant radiographic abnormality is seen. IMPRESSION: Probable distention of the stomach, with nonspecific appearance. Otherwise no evidence of small-bowel obstruction. Electronically Signed   By: DFidela SalisburyM.D.   On: 09/29/2015 19:22   [place]domen Limited Ruq  09/29/2015  CLINICAL DATA:  Cholelithiasis. EXAM: [place]DOMEN LIMITED - RIGHT UPPER QUADRANT COMPARISON:  September 27, 2015. FINDINGS: Gallbladder: 7 mm stone is  seen in neck of gallbladder. No significant gallbladder wall thickening or pericholecystic fluid is noted. No sonographic Murphy's sign is noted. Common bile duct: Diameter: 3.3 mm which is within normal limits. Liver: No focal lesion identified. Within normal limits in parenchymal echogenicity. Right hydronephrosis is noted as described on prior CT scan. IMPRESSION: Solitary gallstone is noted. No definite evidence of cholecystitis is noted. Right hydronephrosis is again noted as described on prior CT scan. Electronically Signed   By: JMarijo Conception M.D.   On: 09/29/2015 10:39    Medications / Allergies:  Scheduled Meds: . amLODipine  10 mg Oral Daily  . atorvastatin  10 mg Oral q1800  . beta carotene w/minerals  1 tablet Oral BID  . cefTRIAXone (ROCEPHIN)  IV  1 g Intravenous Q24H  . Fluticasone-Salmeterol  1 puff Inhalation BID  . guaiFENesin  600 mg Oral BID  . insulin aspart  0-9 Units Subcutaneous TID WC  . insulin glargine  11 Units Subcutaneous QHS  . ipratropium-albuterol  3 mL Nebulization TID  . losartan  100 mg Oral Daily  . multivitamin  5 mL Oral Daily  . nicotine  21 mg Transdermal Daily  . pantoprazole  40 mg Oral BID AC  . sertraline  100 mg Oral Daily  . sodium chloride  3 mL Intravenous Q12H  . tiotropium  18 mcg Inhalation Daily   Continuous Infusions: . sodium chloride 10 mL/hr at 09/29/15 1802  PRN Meds:.acetaminophen **OR** acetaminophen, albuterol, ALPRAZolam, alum & mag hydroxide-simeth, ondansetron, sodium chloride  Antibiotics: Anti-infectives    Start     Dose/Rate Route Frequency Ordered Stop   09/28/15 2000  cefTRIAXone (ROCEPHIN) 1 g in dextrose 5 % 50 mL IVPB     1 g 100 mL/hr over 30 Minutes Intravenous Every 24 hours 09/28/15 0216     09/27/15 2042  cefTRIAXone (ROCEPHIN) 1 g injection    Comments:  Hodgin, Georgianna  : cabinet override      09/27/15 2042 09/27/15 2046   09/27/15 2030  cefTRIAXone (ROCEPHIN) 1 g in dextrose 5 % 50 mL IVPB      1 g 100 mL/hr over 30 Minutes Intravenous  Once 09/27/15 2020 09/27/15 2118        Assessment/Plan Abdominal distention and bloating Cholelithiasis  HIDA scan +cholecystitis The patient is tolerating a soft diet.  His only complaint is belching.  WBC remains normal along with LFTs.   No indications for urgent surgical intervention.  Considering a cholecystostomy tube, however, the patient lacks any physical signs of cholecystitis.  We have asked pulmonary to weigh in on surgery, but I suspect he will not be a surgical candidate.  For nor now, continue with Rocephin, continue with soft diet. 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  Pulmonary consulted. PAF-last dose of Xarelto Friday evening  Thrombocytopenia-plt 104. DM II-CBG/SSI Nicotine dependence--ongoing  ID-rocephin, also for UTI VTE prophylaxis-SCD, may have heparin/lovenox from a surgical standpoint  Erby Pian, [facility] Pager [phone](7A-4:30P)   09/30/2015 9:43 AM

## 2015-10-01 DIAGNOSIS — F329 Major depressive disorder, single episode, unspecified: Secondary | ICD-10-CM

## 2015-10-01 DIAGNOSIS — J441 Chronic obstructive pulmonary disease with (acute) exacerbation: Secondary | ICD-10-CM

## 2015-10-01 DIAGNOSIS — I350 Nonrheumatic aortic (valve) stenosis: Secondary | ICD-10-CM

## 2015-10-01 DIAGNOSIS — K3189 Other diseases of stomach and duodenum: Secondary | ICD-10-CM

## 2015-10-01 DIAGNOSIS — Q231 Congenital insufficiency of aortic valve: Secondary | ICD-10-CM

## 2015-10-01 LAB — CBC
HCT: 34.8 % — ABNORMAL LOW (ref 39.0–52.0)
Hemoglobin: 11.2 g/dL — ABNORMAL LOW (ref 13.0–17.0)
MCH: 29.4 pg (ref 26.0–34.0)
MCHC: 32.2 g/dL (ref 30.0–36.0)
MCV: 91.3 fL (ref 78.0–100.0)
PLATELETS: 124 10*3/uL — AB (ref 150–400)
RBC: 3.81 MIL/uL — AB (ref 4.22–5.81)
RDW: 16.2 % — AB (ref 11.5–15.5)
WBC: 4.7 10*3/uL (ref 4.0–10.5)

## 2015-10-01 LAB — GLUCOSE, CAPILLARY
GLUCOSE-CAPILLARY: 176 mg/dL — AB (ref 65–99)
GLUCOSE-CAPILLARY: 264 mg/dL — AB (ref 65–99)
GLUCOSE-CAPILLARY: 348 mg/dL — AB (ref 65–99)
GLUCOSE-CAPILLARY: 300 mg/dL — AB (ref 65–99)
Glucose-Capillary: 215 mg/dL — ABNORMAL HIGH (ref 65–99)
Glucose-Capillary: 287 mg/dL — ABNORMAL HIGH (ref 65–99)

## 2015-10-01 LAB — BASIC METABOLIC PANEL
ANION GAP: 10 (ref 5–15)
BUN: 14 mg/dL (ref 6–20)
CALCIUM: 8.6 mg/dL — AB (ref 8.9–10.3)
CO2: 31 mmol/L (ref 22–32)
Chloride: 101 mmol/L (ref 101–111)
Creatinine, Ser: 0.79 mg/dL (ref 0.61–1.24)
Glucose, Bld: 207 mg/dL — ABNORMAL HIGH (ref 65–99)
Potassium: 3.7 mmol/L (ref 3.5–5.1)
SODIUM: 142 mmol/L (ref 135–145)

## 2015-10-01 MED ORDER — METOCLOPRAMIDE HCL 5 MG/ML IJ SOLN
5.0000 mg | Freq: Four times a day (QID) | INTRAMUSCULAR | Status: DC
  Administered 2015-10-01: 5 mg via INTRAVENOUS
  Filled 2015-10-01 (×2): qty 2

## 2015-10-01 MED ORDER — SIMETHICONE 80 MG PO CHEW
80.0000 mg | CHEWABLE_TABLET | Freq: Three times a day (TID) | ORAL | Status: DC
  Administered 2015-10-01 – 2015-10-02 (×4): 80 mg via ORAL
  Filled 2015-10-01 (×4): qty 1

## 2015-10-01 MED ORDER — GLYCERIN (LAXATIVE) 2.1 G RE SUPP
1.0000 | Freq: Once | RECTAL | Status: DC
  Filled 2015-10-01: qty 1

## 2015-10-01 MED ORDER — METOCLOPRAMIDE HCL 5 MG/ML IJ SOLN
5.0000 mg | Freq: Three times a day (TID) | INTRAMUSCULAR | Status: DC
  Administered 2015-10-01 – 2015-10-02 (×3): 5 mg via INTRAVENOUS
  Filled 2015-10-01 (×2): qty 2

## 2015-10-01 NOTE — Progress Notes (Signed)
Inpatient Diabetes Program Recommendations  AACE/ADA: New Consensus Statement on Inpatient Glycemic Control (2015)  Target Ranges:  Prepandial:   less than 140 mg/dL      Peak postprandial:   less than 180 mg/dL (1-2 hours)      Critically ill patients:  140 - 180 mg/dL   Review of Glycemic Control  Inpatient Diabetes Program Recommendations:  Insulin - Basal: increase Lantus to 15 Insulin - Meal Coverage: add Novolog 3-4 units TID with meals per Glycemic Control order-set  Thank you  Chaselynn Kepple DaPiedad Climes, RN,CDE Inpatient Diabetes Coordinator [phone] (team pager)

## 2015-10-01 NOTE — Progress Notes (Signed)
     [facility] Progress Note  Subjective:   Afeb. Tolerating diet. Feels less bloated. Belching. Had a smal formed BM but feels he needs to pas some gas.   Objective:  Vital signs in last 24 hours: Temp:  [98.1 F (36.7 C)-98.4 F (36.9 C)] 98.1 F (36.7 C) (01/18 0559) Pulse Rate:  [84-88] 88 (01/18 0559) Resp:  [20-22] 20 (01/18 0559) BP: (128-149)/(53-69) 149/69 mmHg (01/18 0559) SpO2:  [95 %-97 %] 95 % (01/18 0925) Last BM Date: 09/30/15 General:   Alert,  Well-developed,    in NAD Heart:  Regular rate and rhythm; no murmurs Pulm;diminished BS Abdomen:  Soft, nontender, distended  Normal bowel sounds    Lab Results:  Recent Labs  09/29/15 0436 09/30/15 0456 10/01/15 0350  WBC 5.8 5.1 4.7  HGB 11.3* 10.9* 11.2*  HCT 35.6* 34.2* 34.8*  PLT 102* 104* 124*   BMET  Recent Labs  09/29/15 0436 09/30/15 0456 10/01/15 0350  NA 142 143 142  K 4.3 3.9 3.7  CL 105 100* 101  CO2 30 33* 31  GLUCOSE 236* 179* 207*  BUN CREATININE 0.95 0.86 0.79  CALCIUM 8.4* 8.5* 8.6*   LFT  Recent Labs  09/30/15 0456  PROT 5.5*  ALBUMIN 3.0*  AST 12*  ALT 21  ALKPHOS 57  BILITOT 0.6      ASSESSMENT/PLAN:  75 yo male with sx bloating and early satiety--improving with addition of reglan. Some bloating may be due to air ingestion. Will change reglan to q 6 hr. Add simethicone. Glycerin suppository. OOB to chair.Home when ok with medicine.   LOS: 4 days   Hvozdovic, Tollie Pizza PA-C 10/01/2015, Pager [phone] Mon-Fri 8a-5p [phone] after 5p, weekends, holidays    [facility] Attending   I have taken an interval history, reviewed the chart and examined the patient. I agree with the Advanced Practitioner's note, impression and recommendations.     Feels a bit better. I advised that worsening of overall health playing a role I thought. Will continue metaclopramide - try to change to po tomorrow. Could go to 10 mg if needed.  Should not need GI  clinic visit after DC - f/u PCP  Please call if further ?  Iva Boop, MD, [facility] [phone] (pager) [phone] after 5 PM, weekends and holidays  10/01/2015 5:19 PM

## 2015-10-01 NOTE — Progress Notes (Signed)
[facility] PROGRESS NOTE  Brett Michael ZOX:[id number] DOB: [birth year] DOA: 09/27/2015 PCP: Tommy Rainwater, MD  HPI/Brief narrative 75 year old Caucasian male with past medical history as stated below, presented with abdominal bloating and increased urinary frequency. He was complaining of burping and belching. He was also complaining of heartburn with regurgitation of food. In the emergency department CT scan revealed severe right-sided hydronephrosis with renal calculations. Patient was hospitalized for further management. Patient underwent biliary workup, which was equivocal. General surgery was consulted. Subsequently, gastroenterology was also consulted.  Assessment/Plan: Abdominal bloating/cholelithiasis - Ultrasound of the abdomen without signs of cholecystitis.  - His symptoms persist despite use of PPI.  - HIDA scan with concerns for questionable acute cholecystitis. However, patient is nontender in the right upper quadrant. General surgery was consulted. Abdominal films were obtained which shows gastric distention.  -Patient was also seen by pulmonology to ascertain his risk to be able to undergo any kind of surgical intervention. Recommendation NOT undergo elective surgery. The only option for him would be a percutaneous drain. However it is not even clear if his symptoms are due to the gallbladder or not.  -Gastroenterology was subsequently consulted in view of the gastric distention noted on x-ray.  -His symptoms are most likely unrelated to the hydronephrosis detected on CT scan.  -LFTs are normal. - Symptoms improved with reglan. GI recs noted to transition to PO reglan tomorrow, possible d/c then   Severe right hydronephrosis -CT abdomen/pelvis has no bowel obstruction, but showed old severe right hydronephrosis with severe right renal cortical thinning and right mid ureteral calcification.  -Urology was consulted. These changes are chronic.  -Patient's  renal function is normal with good urine output. No indication for acute intervention.  UTI -Continue empiric ceftriaxone. No growth on urine culture.  History of COPD -Patient is oxygen dependent and wears 6 L nasal cannula oxygen at baseline. He does not seem to have acute exacerbation.  -X-ray of chest did not show infiltration. Continue home medications. Continue nebulizers.  -Not a good candidate for surgical intervention due to his severe COPD.  Paroxysmal atrial fibrillation on Xarelto Heart rate is reasonably well controlled. Chadvasc score 4. Currently on anticoagulation. Hold anticoagulation for now.  Diabetes mellitus type 2 -Last hemoglobin A1c on record was noted to be 6.7 in 10/22/2013.  -Patient is taking Januvia, metformin and Lantus.  Essential Hypertension -Continue amlodipine and losartan  Thrombocytopenia No bleeding tendency. Continue to monitor by CBC  Depression Stable. Continue home medications: Zoloft  Tobacco abuse Nicotine patch  Code Status: Full Family Communication: Pt in room, family at bedside Disposition Plan: possible d/c in 24hrs   Consultants: Urology. General surgery. Pulmonology. Gastroenterology  Procedures:    Antibiotics: Anti-infectives    Start     Dose/Rate Route Frequency Ordered Stop   09/28/15 2000  cefTRIAXone (ROCEPHIN) 1 g in dextrose 5 % 50 mL IVPB     1 g 100 mL/hr over 30 Minutes Intravenous Every 24 hours 09/28/15 0216     09/27/15 2042  cefTRIAXone (ROCEPHIN) 1 g injection    Comments:  Hodgin, Georgianna  : cabinet override      09/27/15 2042 09/27/15 2046   09/27/15 2030  cefTRIAXone (ROCEPHIN) 1 g in dextrose 5 % 50 mL IVPB     1 g 100 mL/hr over 30 Minutes Intravenous  Once 09/27/15 2020 09/27/15 2118       HPI/Subjective: Feels better today  Objective: Filed Vitals:   10/01/15 0559 10/01/15 0925 10/01/15  1300 10/01/15 1431  BP: 149/69  141/69   Pulse: 88  86   Temp: 98.1 F (36.7 C)  98.2 F  (36.8 C)   TempSrc: Oral  Oral   Resp: 20  20   Height:      Weight:      SpO2: 97% 95% 95% 93%    Intake/Output Summary (Last 24 hours) at 10/01/15 1753 Last data filed at 10/01/15 1700  Gross per 24 hour  Intake   1750 ml  Output   2175 ml  Net   -425 ml   Filed Weights   09/27/15 1616 09/28/15 0121  Weight: 86.183 kg (190 lb) 86.8 kg (191 lb 5.8 oz)    Exam:   General:  Awake, in nad  Cardiovascular: regular, s1, s2  Respiratory: normal resp effort, no wheezing  Abdomen: soft, nondistended  Musculoskeletal: perfused, no clubbing   Data Reviewed: Basic Metabolic Panel:  Recent Labs Lab 09/27/15 1803 09/28/15 0355 09/29/15 0436 09/30/15 0456 10/01/15 0350  NA 138 140 142 143 142  K 4.3 4.6 4.3 3.9 3.7  CL 102 99* 105 100* 101  CO2 33* 31  GLUCOSE 168* 302* 236* 179* 207*  BUN CREATININE 0.73 0.89 0.95 0.86 0.79  CALCIUM 8.7* 8.5* 8.4* 8.5* 8.6*   Liver Function Tests:  Recent Labs Lab 09/27/15 1803 09/28/15 0355 09/29/15 0436 09/30/15 0456  AST 13* 12* 16 12*  ALT ALKPHOS 63 61 52 57  BILITOT 0.7 0.7 0.5 0.6  PROT 6.7 6.3* 5.6* 5.5*  ALBUMIN 3.7 3.5 3.1* 3.0*    Recent Labs Lab 09/27/15 1803  LIPASE 25   No results for input(s): AMMONIA in the last 168 hours. CBC:  Recent Labs Lab 09/27/15 1803 09/28/15 0355 09/29/15 0436 09/30/15 0456 10/01/15 0350  WBC 7.2 4.7 5.8 5.1 4.7  NEUTROABS 6.0  --   --  3.9  --   HGB 12.1* 11.5* 11.3* 10.9* 11.2*  HCT 37.7* 35.7* 35.6* 34.2* 34.8*  MCV 91.1 92.0 93.0 92.9 91.3  PLT 105* 100* 102* 104* 124*   Cardiac Enzymes:  Recent Labs Lab 09/27/15 1803  TROPONINI <0.03   BNP (last 3 results)  Recent Labs  09/03/15 2156 09/27/15 1803  BNP 77.9 72.4    ProBNP (last 3 results) No results for input(s): PROBNP in the last 8760 hours.  CBG:  Recent Labs Lab 09/30/15 1654 09/30/15 2215 10/01/15 0748 10/01/15 1151 10/01/15 1653  GLUCAP  264* 348* 215* 300* 287*    Recent Results (from the past 240 hour(s))  Urine culture     Status: None   Collection Time: 09/27/15  7:20 PM  Result Value Ref Range Status   Specimen Description URINE, CLEAN CATCH  Final   Special Requests NONE  Final   Culture   Final    MULTIPLE SPECIES PRESENT, SUGGEST RECOLLECTION Performed at [facility]    Report Status 09/29/2015 FINAL  Final  Culture, blood (Routine X 2) w Reflex to ID Panel     Status: None (Preliminary result)   Collection Time: 09/28/15  3:50 AM  Result Value Ref Range Status   Specimen Description BLOOD RIGHT ARM  Final   Special Requests BOTTLES DRAWN AEROBIC AND ANAEROBIC 6CC  Final   Culture   Final    NO GROWTH 3 DAYS Performed at [facility]    Report Status PENDING  Incomplete  Culture, blood (  Routine X 2) w Reflex to ID Panel     Status: None (Preliminary result)   Collection Time: 09/28/15  3:55 AM  Result Value Ref Range Status   Specimen Description BLOOD RIGHT HAND  Final   Special Requests IN PEDIATRIC BOTTLE 4CC  Final   Culture   Final    NO GROWTH 3 DAYS Performed at [facility]    Report Status PENDING  Incomplete     Studies: No results found.  Scheduled Meds: . albuterol  2.5 mg Nebulization TID  . amLODipine  10 mg Oral Daily  . atorvastatin  10 mg Oral q1800  . beta carotene w/minerals  1 tablet Oral BID  . cefTRIAXone (ROCEPHIN)  IV  1 g Intravenous Q24H  . Fluticasone-Salmeterol  1 puff Inhalation BID  . Glycerin (Adult)  1 suppository Rectal Once  . guaiFENesin  600 mg Oral BID  . insulin aspart  0-9 Units Subcutaneous TID WC  . insulin glargine  11 Units Subcutaneous QHS  . losartan  100 mg Oral Daily  . metoCLOPramide (REGLAN) injection  5 mg Intravenous TID AC & HS  . multivitamin  5 mL Oral Daily  . nicotine  21 mg Transdermal Daily  . pantoprazole  40 mg Oral BID AC  . sertraline  100 mg Oral Daily  . simethicone  80 mg Oral Q8H  . sodium chloride   3 mL Intravenous Q12H  . tiotropium  18 mcg Inhalation Daily   Continuous Infusions: . sodium chloride 10 mL/hr at 09/30/15 1644    Principal Problem:   Abdominal bloating Active Problems:   Stroke [facility])   Essential hypertension   Diabetes mellitus without complication (HCC)   COPD (chronic obstructive pulmonary disease) (HCC)   Tobacco use disorder   Aortic stenosis due to bicuspid aortic valve   Interatrial septal aneurysm with PFO   Paroxysmal atrial fibrillation (HCC)   Chronic respiratory failure with hypoxia (HCC)   Depression   HLD (hyperlipidemia)   Hydroureteronephrosis   UTI (lower urinary tract infection)   Kidney stone   Gastric distention    CHIU, STEPHEN K  [facility] Pager [phone]. If 7PM-7AM, please contact night-coverage at [email or web address], password [facility] 10/01/2015, 5:53 PM  LOS: 4 days

## 2015-10-01 NOTE — Progress Notes (Signed)
Patient ID: Brett Michael, male   DOB: July 09, 1941, 75 y.o.   MRN: [id number]     [facility]      [address] [place], [place] [address]    Phone: [phone] FAX: [phone]     Subjective: Tolerating POs. Afebrile. Thinks he's had some improvement with reglan.  Still with belching.   Objective:  Vital signs:  Filed Vitals:   09/30/15 1342 09/30/15 1822 09/30/15 2217 10/01/15 0559  BP: 128/53  132/59 149/69  Pulse: 86  84 88  Temp: 98.4 F (36.9 C)  98.1 F (36.7 C) 98.1 F (36.7 C)  TempSrc: Oral  Oral Oral  Resp: _0 Height:      Weight:      SpO2: 96% 96% 96% 97%    Last BM Date: 09/30/15  Intake/Output   Yesterday:  01/17 0701 - 01/18 0700 In: 1735.5 [P.O.:1440; I.V.:245.5; IV Piggyback:50] Out: 2075 [Urine:2075] This shift:  Total I/O In: -  Out: 350 [Urine:350]  Physical Exam: General: Pt awake/alert/oriented x4 in no acute distress Abdomen: Soft. Distended.  Non tender. Umbilical hernia, soft.   Problem List:   Principal Problem:   Abdominal bloating Active Problems:   Stroke [facility])   Essential hypertension   Diabetes mellitus without complication (HCC)   COPD (chronic obstructive pulmonary disease) (HCC)   Tobacco use disorder   Aortic stenosis due to bicuspid aortic valve   Interatrial septal aneurysm with PFO   Paroxysmal atrial fibrillation (HCC)   Chronic respiratory failure with hypoxia (HCC)   Depression   HLD (hyperlipidemia)   Hydroureteronephrosis   UTI (lower urinary tract infection)   Kidney stone   Gastric distention    Results:   Labs: Results for orders placed or performed during the hospital encounter of 09/27/15 (from the past 48 hour(s))  Glucose, capillary     Status: Abnormal   Collection Time: 09/29/15 11:53 AM  Result Value Ref Range   Glucose-Capillary 151 (H) 65 - 99 mg/dL  Glucose, capillary     Status: Abnormal   Collection Time: 09/29/15  4:40  PM  Result Value Ref Range   Glucose-Capillary 210 (H) 65 - 99 mg/dL  Glucose, capillary     Status: Abnormal   Collection Time: 09/29/15  9:02 PM  Result Value Ref Range   Glucose-Capillary 287 (H) 65 - 99 mg/dL  Comprehensive metabolic panel     Status: Abnormal   Collection Time: 09/30/15  4:56 AM  Result Value Ref Range   Sodium 143 135 - 145 mmol/L   Potassium 3.9 3.5 - 5.1 mmol/L   Chloride 100 (L) 101 - 111 mmol/L   CO2 33 (H) 22 - 32 mmol/L   Glucose, Bld 179 (H) 65 - 99 mg/dL   BUN 15 6 - 20 mg/dL   Creatinine, Ser 0.86 0.61 - 1.24 mg/dL   Calcium 8.5 (L) 8.9 - 10.3 mg/dL   Total Protein 5.5 (L) 6.5 - 8.1 g/dL   Albumin 3.0 (L) 3.5 - 5.0 g/dL   AST 12 (L) 15 - 41 U/L   ALT 21 17 - 63 U/L   Alkaline Phosphatase 57 38 - 126 U/L   Total Bilirubin 0.6 0.3 - 1.2 mg/dL   GFR calc non Af Amer >60 >60 mL/min   GFR calc Af Amer >60 >60 mL/min    Comment: (NOTE) The eGFR has been calculated using the CKD EPI equation. This calculation has not been  validated in all clinical situations. eGFR's persistently <60 mL/min signify possible Chronic Kidney Disease.    Anion gap 10 5 - 15  CBC with Differential/Platelet     Status: Abnormal   Collection Time: 09/30/15  4:56 AM  Result Value Ref Range   WBC 5.1 4.0 - 10.5 K/uL   RBC 3.68 (L) 4.22 - 5.81 MIL/uL   Hemoglobin 10.9 (L) 13.0 - 17.0 g/dL   HCT 34.2 (L) 39.0 - 52.0 %   MCV 92.9 78.0 - 100.0 fL   MCH 29.6 26.0 - 34.0 pg   MCHC 31.9 30.0 - 36.0 g/dL   RDW 16.3 (H) 11.5 - 15.5 %   Platelets 104 (L) 150 - 400 K/uL    Comment: CONSISTENT WITH PREVIOUS RESULT   Neutrophils Relative % 76 %   Lymphocytes Relative 14 %   Monocytes Relative 6 %   Eosinophils Relative 4 %   Basophils Relative 0 %   Neutro Abs 3.9 1.7 - 7.7 K/uL   Lymphs Abs 0.7 0.7 - 4.0 K/uL   Monocytes Absolute 0.3 0.1 - 1.0 K/uL   Eosinophils Absolute 0.2 0.0 - 0.7 K/uL   Basophils Absolute 0.0 0.0 - 0.1 K/uL  Glucose, capillary     Status: Abnormal    Collection Time: 09/30/15  7:25 AM  Result Value Ref Range   Glucose-Capillary 176 (H) 65 - 99 mg/dL  Glucose, capillary     Status: Abnormal   Collection Time: 09/30/15 11:51 AM  Result Value Ref Range   Glucose-Capillary 304 (H) 65 - 99 mg/dL  Glucose, capillary     Status: Abnormal   Collection Time: 09/30/15  4:54 PM  Result Value Ref Range   Glucose-Capillary 264 (H) 65 - 99 mg/dL  Glucose, capillary     Status: Abnormal   Collection Time: 09/30/15 10:15 PM  Result Value Ref Range   Glucose-Capillary 348 (H) 65 - 99 mg/dL   Comment 1 Notify RN    Comment 2 Document in Chart   CBC     Status: Abnormal   Collection Time: 10/01/15  3:50 AM  Result Value Ref Range   WBC 4.7 4.0 - 10.5 K/uL   RBC 3.81 (L) 4.22 - 5.81 MIL/uL   Hemoglobin 11.2 (L) 13.0 - 17.0 g/dL   HCT 34.8 (L) 39.0 - 52.0 %   MCV 91.3 78.0 - 100.0 fL   MCH 29.4 26.0 - 34.0 pg   MCHC 32.2 30.0 - 36.0 g/dL   RDW 16.2 (H) 11.5 - 15.5 %   Platelets 124 (L) 150 - 400 K/uL  Basic metabolic panel     Status: Abnormal   Collection Time: 10/01/15  3:50 AM  Result Value Ref Range   Sodium 142 135 - 145 mmol/L   Potassium 3.7 3.5 - 5.1 mmol/L   Chloride 101 101 - 111 mmol/L   CO2 31 22 - 32 mmol/L   Glucose, Bld 207 (H) 65 - 99 mg/dL   BUN 14 6 - 20 mg/dL   Creatinine, Ser 0.79 0.61 - 1.24 mg/dL   Calcium 8.6 (L) 8.9 - 10.3 mg/dL   GFR calc non Af Amer >60 >60 mL/min   GFR calc Af Amer >60 >60 mL/min    Comment: (NOTE) The eGFR has been calculated using the CKD EPI equation. This calculation has not been validated in all clinical situations. eGFR's persistently <60 mL/min signify possible Chronic Kidney Disease.    Anion gap 10 5 - 15    Imaging /  Studies: Nm Hepatobiliary Liver Func  09/29/2015  CLINICAL DATA:  Abdominal bloating, reflux, dyspepsia. Cholelithiasis on abdominal ultrasound. EXAM: NUCLEAR MEDICINE HEPATOBILIARY IMAGING TECHNIQUE: Sequential images of the abdomen were obtained out to 60 minutes  following intravenous administration of radiopharmaceutical. RADIOPHARMACEUTICALS:  6.16 mCi Tc-94m Choletec IV COMPARISON:  Abdominal ultrasound dated 09/29/2015. FINDINGS: Normal hepatic excretion. Visualization of small bowel within 10-15 minutes. Nonvisualization of the gallbladder at 1 hour. 3.47 mg morphine was administered IV. Nonvisualization of the gallbladder after 1 hour post morphine administration, suspicious for acute cholecystitis. IMPRESSION: Nonvisualization of the gallbladder, suspicious for acute cholecystitis. These results will be called to the ordering clinician or representative by the Radiologist Assistant, and communication documented in the PACS or zVision Dashboard. Electronically Signed   By: SJulian HyM.D.   On: 09/29/2015 15:37   Dg Abd 2 Views  09/29/2015  CLINICAL DATA:  Abdominal pain and bloating. EXAM: ABDOMEN - 2 VIEW COMPARISON:  CT of the abdomen pelvis 09/27/2015 FINDINGS: The bowel gas pattern is nonobstructive/nonspecific. Single air-fluid level containing gas distended structure within the upper mid abdomen likely represents the stomach. There is normal colonic bowel gas pattern. There is no evidence of free air. No radio-opaque calculi or other significant radiographic abnormality is seen. IMPRESSION: Probable distention of the stomach, with nonspecific appearance. Otherwise no evidence of small-bowel obstruction. Electronically Signed   By: DFidela SalisburyM.D.   On: 09/29/2015 19:22   [place]dom[place] Limited Ruq  09/29/2015  CLINICAL DATA:  Cholelithiasis. EXAM: [place]DOM[place] LIMITED - RIGHT UPPER QUADRANT COMPARISON:  September 27, 2015. FINDINGS: Gallbladder: 7 mm stone is seen in neck of gallbladder. No significant gallbladder wall thickening or pericholecystic fluid is noted. No sonographic Murphy's sign is noted. Common bile duct: Diameter: 3.3 mm which is within normal limits. Liver: No focal lesion identified. Within normal limits in parenchymal  echogenicity. Right hydronephrosis is noted as described on prior CT scan. IMPRESSION: Solitary gallstone is noted. No definite evidence of cholecystitis is noted. Right hydronephrosis is again noted as described on prior CT scan. Electronically Signed   By: JMarijo Conception M.D.   On: 09/29/2015 10:39    Medications / Allergies:  Scheduled Meds: . albuterol  2.5 mg Nebulization TID  . amLODipine  10 mg Oral Daily  . atorvastatin  10 mg Oral q1800  . beta carotene w/minerals  1 tablet Oral BID  . cefTRIAXone (ROCEPHIN)  IV  1 g Intravenous Q24H  . Fluticasone-Salmeterol  1 puff Inhalation BID  . guaiFENesin  600 mg Oral BID  . insulin aspart  0-9 Units Subcutaneous TID WC  . insulin glargine  11 Units Subcutaneous QHS  . losartan  100 mg Oral Daily  . metoCLOPramide (REGLAN) injection  5 mg Intravenous 3 times per day  . multivitamin  5 mL Oral Daily  . nicotine  21 mg Transdermal Daily  . pantoprazole  40 mg Oral BID AC  . sertraline  100 mg Oral Daily  . sodium chloride  3 mL Intravenous Q12H  . tiotropium  18 mcg Inhalation Daily   Continuous Infusions: . sodium chloride 10 mL/hr at 09/30/15 1644   PRN Meds:.acetaminophen **OR** acetaminophen, albuterol, ALPRAZolam, alum & mag hydroxide-simeth, ondansetron, sodium chloride  Antibiotics: Anti-infectives    Start     Dose/Rate Route Frequency Ordered Stop   09/28/15 2000  cefTRIAXone (ROCEPHIN) 1 g in dextrose 5 % 50 mL IVPB     1 g 100 mL/hr over 30 Minutes Intravenous  Every 24 hours 09/28/15 0216     09/27/15 2042  cefTRIAXone (ROCEPHIN) 1 g injection    Comments:  Hodgin, Georgianna  : cabinet override      09/27/15 2042 09/27/15 2046   09/27/15 2030  cefTRIAXone (ROCEPHIN) 1 g in dextrose 5 % 50 mL IVPB     1 g 100 mL/hr over 30 Minutes Intravenous  Once 09/27/15 2020 09/27/15 2118       Assessment/Plan Abdominal distention and bloating Cholelithiasis  HIDA scan +cholecystitis, but clinically does not appear to  have cholecystitis. No role for a cholecystostomy tube place.  He is not a surgical candidate given advanced COPD. Recommend changing to PO antibiotics to complete a 5 day course. Further management per primary team/GI Surgery signing off.  Please call with further assistance     End stage COPD-home 02, can tolerate transfers from bed to chair.  PAF-may resume Xarelto from a surgical standpoint.  Thrombocytopenia-plt 124. DM II-CBG/SSI Nicotine dependence--ongoing  ID-rocephin, also for UTI.  See above.      Erby Pian, [facility] Pager [phone](7A-4:30P)   10/01/2015 8:56 AM

## 2015-10-02 DIAGNOSIS — E785 Hyperlipidemia, unspecified: Secondary | ICD-10-CM

## 2015-10-02 LAB — GLUCOSE, CAPILLARY
Glucose-Capillary: 249 mg/dL — ABNORMAL HIGH (ref 65–99)
Glucose-Capillary: 215 mg/dL — ABNORMAL HIGH (ref 65–99)

## 2015-10-02 LAB — HEMOGLOBIN A1C
HEMOGLOBIN A1C: 7.2 % — AB (ref 4.8–5.6)
Mean Plasma Glucose: 160 mg/dL

## 2015-10-02 MED ORDER — METOCLOPRAMIDE HCL 5 MG PO TABS: 5.0000 mg | ORAL_TABLET | Freq: Three times a day (TID) | ORAL | Status: AC

## 2015-10-02 MED ORDER — ALPRAZOLAM 0.5 MG PO TABS: 0.5000 mg | ORAL_TABLET | Freq: Every evening | ORAL | Status: AC | PRN

## 2015-10-02 NOTE — Progress Notes (Addendum)
Inpatient Diabetes Program Recommendations  AACE/ADA: New Consensus Statement on Inpatient Glycemic Control (2015)  Target Ranges:  Prepandial:   less than 140 mg/dL      Peak postprandial:   less than 180 mg/dL (1-2 hours)      Critically ill patients:  140 - 180 mg/dL   Results for LEODAN, BOLYARD (MRN [id number]) as of 10/02/2015 10:39  Ref. Range 10/01/2015 07:48 10/01/2015 11:51 10/01/2015 16:53 10/01/2015 22:27  Glucose-Capillary Latest Ref Range: 65-99 mg/dL 409 (H) 811 (H) 914 (H) 249 (H)   Results for DEAGAN, SEVIN (MRN [id number]) as of 10/02/2015 10:39  Ref. Range 10/02/2015 07:49  Glucose-Capillary Latest Ref Range: 65-99 mg/dL 086 (H)    Home DM Meds: Januvia 100 mg daily  Metformin 500 mg bid  Lantus 20 units QHS  Current Insulin Orders: Lantus 11 units QHS  Novolog Sensitive SSI (0-9 units) TID AC     MD- Please consider the following in-hospital insulin adjustments while patient's home oral DM meds on hold:  1. Increase Lantus to 15 units QHS   2. Start low dose Novolog Meal Coverage- Novolog 4 units tidwc (patient is eating 100% of meals)    --Will follow patient during hospitalization--  Ambrose Finland RN, MSN, CDE Diabetes Coordinator Inpatient Glycemic Control Team Team Pager: [phone] (8a-5p)

## 2015-10-02 NOTE — Progress Notes (Signed)
Physical Therapy Treatment Patient Details Name: Brett Michael MRN: [id number] DOB: 1941-02-11 Today's Date: 10-23-15    History of Present Illness 75 yo male former smoker developed progressive abdominal pain, nausea, and bloating. He was admitted and evaluated by surgery. CT abd showed cholelithiasis and Rt hydronephrosis, and confirmed on Abd u/s. He had HIDA scan which was concerning for acute cholecystitis  PMH includes CVA, aortic stenosis, COPD, DM, HTN, afib    PT Comments    Pt ambulated good distance in hallway with granddaughter present.  Pt to d/c home today.  Follow Up Recommendations  No PT follow up;Supervision/Assistance - 24 hour     Equipment Recommendations  None recommended by PT    Recommendations for Other Services       Precautions / Restrictions Precautions Precautions: Fall Precaution Comments: monitor O2    Mobility  Bed Mobility Overal bed mobility: Modified Independent                Transfers Overall transfer level: Needs assistance Equipment used: None Transfers: Sit to/from Stand Sit to Stand: Supervision         General transfer comment: for safety/lines  Ambulation/Gait Ambulation/Gait assistance: Min guard;Supervision Ambulation Distance (Feet): 160 Feet Assistive device: None Gait Pattern/deviations: Step-through pattern     General Gait Details: pt pushed O2 tank and ambulated good distance, remained on 6L O2 Brett Michael and SpO2 79% upon return to room so increased to 8L for recovery and quickly improved to 95%, left back on 6L    Stairs            Wheelchair Mobility    Modified Rankin (Stroke Patients Only)       Balance                                    Cognition Arousal/Alertness: Awake/alert Behavior During Therapy: WFL for tasks assessed/performed Overall Cognitive Status: Within Functional Limits for tasks assessed                      Exercises      General Comments         Pertinent Vitals/Pain Pain Assessment: No/denies pain    Home Living                      Prior Function            PT Goals (current goals can now be found in the care plan section) Progress towards PT goals: Progressing toward goals    Frequency  Min 3X/week    PT Plan Current plan remains appropriate    Co-evaluation             End of Session Equipment Utilized During Treatment: Oxygen Activity Tolerance: Patient tolerated treatment well Patient left: in bed;with call bell/phone within reach;with family/visitor present     Time: 4098-1191 PT Time Calculation (min) (ACUTE ONLY): 14 min  Charges:  $Gait Training: 8-22 mins                    G Codes:      Brett Michael,KATHrine E 23-Oct-2015, 1:49 PM Zenovia Jarred, PT, DPT 10/23/15 Pager: [phone]

## 2015-10-02 NOTE — Discharge Summary (Addendum)
Physician Discharge Summary  Brett Michael QMV:[id number] DOB: May 30, 1941 DOA: 09/27/2015  PCP: Tommy Rainwater, MD  Admit date: 09/27/2015 Discharge date: 10/02/2015  Time spent: 20 minutes  Recommendations for Outpatient Follow-up:  1. Follow up with PCP in 1-2 weeks   Discharge Diagnoses:  Principal Problem:   Abdominal bloating Active Problems:   Stroke [facility])   Essential hypertension   Diabetes mellitus without complication (HCC)   COPD (chronic obstructive pulmonary disease) (HCC)   Tobacco use disorder   Aortic stenosis due to bicuspid aortic valve   Interatrial septal aneurysm with PFO   Paroxysmal atrial fibrillation (HCC)   Chronic respiratory failure with hypoxia (HCC)   Depression   HLD (hyperlipidemia)   Hydroureteronephrosis   UTI (lower urinary tract infection)   Kidney stone   Gastric distention   Discharge Condition: Stable  Diet recommendation: Diabetic, Heart Healthy  Filed Weights   09/27/15 1616 09/28/15 0121  Weight: 86.183 kg (190 lb) 86.8 kg (191 lb 5.8 oz)    History of present illness:  Please review dictated H and P from 1/14 for details. Briefly, 75 year old Caucasian male with past medical history as stated below, presented with abdominal bloating and increased urinary frequency. He was complaining of burping and belching. He was also complaining of heartburn with regurgitation of food. In the emergency department CT scan revealed severe right-sided hydronephrosis with renal calculations. Patient was hospitalized for further management. Patient underwent biliary workup, which was equivocal. General surgery was consulted. Subsequently, gastroenterology was also consulted.  Hospital Course:  Abdominal bloating/cholelithiasis - Ultrasound of the abdomen without signs of cholecystitis.  - His symptoms persist despite use of PPI.  - HIDA scan with concerns for questionable acute cholecystitis. However, patient is nontender in the  right upper quadrant. General surgery was consulted. Abdominal films were obtained which showed gastric distention.  -Patient was also seen by pulmonology to ascertain his risk to be able to undergo any kind of surgical intervention. Pulmonary recommendation NOT to undergo elective surgery. The only option for him would be a percutaneous drain. However it is not even clear if his symptoms are due to the gallbladder or not.  -Gastroenterology was subsequently consulted in view of the gastric distention noted on x-ray.  -His symptoms are most likely unrelated to the hydronephrosis detected on CT scan.  -LFTs are normal. - Symptoms improved markedly with reglan. GI recs noted to transition to PO reglan then d/c home  Severe right hydronephrosis -CT abdomen/pelvis has no bowel obstruction, but showed old severe right hydronephrosis with severe right renal cortical thinning and right mid ureteral calcification.  -Urology was consulted. These changes are chronic.  -Patient's renal function is normal with good urine output. No indication for acute intervention.  UTI -Continued empiric ceftriaxone. No growth on urine culture. No further abx on discharge after completing 5 days of antibiotics  History of COPD -Patient is oxygen dependent and wears 6 L nasal cannula oxygen at baseline. He does not seem to have acute exacerbation.  -X-ray of chest did not show infiltration. Continue home medications. Continue nebulizers.  -Not a good candidate for surgical intervention due to his severe COPD.  Paroxysmal atrial fibrillation on Xarelto Heart rate is reasonably well controlled. Chadvasc score 4. Currently on anticoagulation. Hold anticoagulation for now.  Diabetes mellitus type 2 -Last hemoglobin A1c on record was noted to be 6.7 in 10/22/2013.  -Patient is taking Januvia, metformin and Lantus.  Essential Hypertension -Continue amlodipine and losartan  Thrombocytopenia No bleeding  tendency.  Continue to monitor by CBC  Depression Stable. Continue home medications: Zoloft  Tobacco abuse Nicotine patch  Consultations: Urology. General surgery. Pulmonology. Gastroenterology  Discharge Exam: Filed Vitals:   10/01/15 2154 10/01/15 2223 10/02/15 0550 10/02/15 0758  BP:  151/70 129/53   Pulse:  83 73 88  Temp:  99.1 F (37.3 C) 98.8 F (37.1 C)   TempSrc:  Oral Oral   Resp:  20 18 20   Height:      Weight:      SpO2: 93% 95% 96%     General: awake, in nad Cardiovascular: regular, s1, s2 Respiratory: normal resp effort, no wheezing  Discharge Instructions     Medication List    TAKE these medications        AEROCHAMBER MINI CHAMBER Devi  Use as directed     albuterol 108 (90 Base) MCG/ACT inhaler  Commonly known as:  PROVENTIL HFA;VENTOLIN HFA  Inhale 1 puff into the lungs every 6 (six) hours as needed for wheezing or shortness of breath.     albuterol (2.5 MG/3ML) 0.083% nebulizer solution  Commonly known as:  PROVENTIL  Take 3 mLs (2.5 mg total) by nebulization every 2 (two) hours as needed for wheezing or shortness of breath.     ALPRAZolam 0.5 MG tablet  Commonly known as:  XANAX  Take 1 tablet (0.5 mg total) by mouth at bedtime as needed for anxiety.     amLODipine 10 MG tablet  Commonly known as:  NORVASC  Take 10 mg by mouth daily.     atorvastatin 20 MG tablet  Commonly known as:  LIPITOR  Take 0.5 tablets (10 mg total) by mouth daily at 6 PM. KEEP OV.     benzonatate 200 MG capsule  Commonly known as:  TESSALON  Take 1 capsule (200 mg total) by mouth 2 (two) times daily as needed for cough.     Fluticasone-Salmeterol 250-50 MCG/DOSE Aepb  Commonly known as:  ADVAIR  Inhale 1 puff into the lungs 2 (two) times daily.     guaiFENesin 600 MG 12 hr tablet  Commonly known as:  MUCINEX  Take 1 tablet (600 mg total) by mouth 2 (two) times daily.     insulin glargine 100 UNIT/ML injection  Commonly known as:  LANTUS  Inject 20 Units into  the skin at bedtime.     ipratropium-albuterol 0.5-2.5 (3) MG/3ML Soln  Commonly known as:  DUONEB  Take 3 mLs by nebulization 3 (three) times daily.     losartan 100 MG tablet  Commonly known as:  COZAAR  Take 100 mg by mouth daily.     metFORMIN 500 MG tablet  Commonly known as:  GLUCOPHAGE  Take 1 tablet (500 mg total) by mouth 2 (two) times daily with a meal.     metoCLOPramide 5 MG tablet  Commonly known as:  REGLAN  Take 1 tablet (5 mg total) by mouth 4 (four) times daily -  before meals and at bedtime.     MULTIVITAMIN PO  Take 1 tablet by mouth daily.     beta carotene w/minerals tablet  Take 1 tablet by mouth 2 (two) times daily.     nicotine 21 mg/24hr patch  Commonly known as:  NICODERM CQ - dosed in mg/24 hours  Place 1 patch (21 mg total) onto the skin daily.     omeprazole 20 MG capsule  Commonly known as:  PRILOSEC  Take 20 mg by mouth daily.  OXYGEN  Inhale 6 L into the lungs continuous.     rivaroxaban 20 MG Tabs tablet  Commonly known as:  XARELTO  Take 1 tablet (20 mg total) by mouth daily with supper.     sertraline 100 MG tablet  Commonly known as:  ZOLOFT  Take 100 mg by mouth daily.     sitaGLIPtin 100 MG tablet  Commonly known as:  JANUVIA  Take 100 mg by mouth daily.     tiotropium 18 MCG inhalation capsule  Commonly known as:  SPIRIVA  Place 18 mcg into inhaler and inhale daily.       Allergies  Allergen Reactions  . Symbicort [Budesonide-Formoterol Fumarate] Anaphylaxis and Swelling    Tongue swells   Follow-up Information    Follow up with Shamleffer, Landry Mellow, MD. Schedule an appointment as soon as possible for a visit in 2 weeks.   Specialty:  Internal Medicine   Why:  Hospital follow up   Contact information:   [address]  [address] [place] [address] [phone]        The results of significant diagnostics from this hospitalization (including imaging, microbiology, ancillary and laboratory) are  listed below for reference.    Significant Diagnostic Studies: Ct Abdomen Pelvis Wo Contrast  09/27/2015  CLINICAL DATA:  Abdominal distention for 2-3 days.  Pain. EXAM: CT ABDOMEN AND PELVIS WITHOUT CONTRAST TECHNIQUE: Multidetector CT imaging of the abdomen and pelvis was performed following the standard protocol without IV contrast. COMPARISON:  08/12/2015 FINDINGS: Lower chest: Mild bibasilar chronic interstitial disease. Normal heart size. Hepatobiliary: Normal liver. Cholelithiasis. No intrahepatic or extrahepatic biliary ductal dilatation. Pancreas: Normal. Spleen: Normal. Adrenals/Urinary Tract: Normal adrenal glands. Severe right hydronephrosis with severe right renal cortical thinning. Normal left kidney. There is a right ureteral calcification measuring 2.7 mm in transverse dimension and 2.8 cm in craniocaudal dimension. Decompressed bladder. Stomach/Bowel: No bowel wall thickening or dilatation. Fact containing right inguinal hernia. Diverticulosis without evidence of diverticulitis. No pneumatosis, pneumoperitoneum or portal venous gas. Small fat containing umbilical hernia. Vascular/Lymphatic: Normal caliber abdominal aorta. No lymphadenopathy. Other: No fluid collection or hematoma. Musculoskeletal: No lytic or sclerotic osseous lesion. 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 IMPRESSION: 1. Severe right hydronephrosis with severe right renal cortical thinning. There is a right mid ureteral calcification measuring 2.7 mm in transverse dimension and 2.8 cm in craniocaudal dimension. 2. 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 3. Cholelithiasis. Electronically Signed   By: Elige Ko   On: 09/27/2015 18:58   Dg Chest 2 View  09/05/2015  CLINICAL DATA:  Evaluate pneumonia.  Shortness of breath, cough and congestion. EXAM: CHEST  2 VIEW COMPARISON:  09/03/2015 FINDINGS: Normal heart size. There is aortic atherosclerosis noted. The lungs are hyperinflated there are coarsened interstitial markings of emphysema. Calcified granuloma in the left lower lobe is identified. No superimposed airspace consolidation identified. IMPRESSION: 1. Chronic lung disease compatible with COPD/emphysema. 2. Aortic atherosclerosis. Electronically Signed   By: Signa Kell M.D.   On: 09/05/2015 08:07   Nm Hepatobiliary Liver Func  09/29/2015  CLINICAL DATA:  Abdominal bloating, reflux, dyspepsia. Cholelithiasis on abdominal ultrasound. EXAM: NUCLEAR MEDICINE HEPATOBILIARY IMAGING TECHNIQUE: Sequential images of the abdomen were obtained out to 60 minutes following intravenous administration of radiopharmaceutical. RADIOPHARMACEUTICALS:  6.16 mCi Tc-73m  Choletec  IV COMPARISON:  Abdominal ultrasound dated 09/29/2015. FINDINGS: Normal hepatic excretion. Visualization of small bowel within 10-15 minutes. Nonvisualization of the gallbladder at 1 hour. 3.47 mg morphine was administered IV. Nonvisualization of the gallbladder after 1 hour post morphine administration, suspicious for acute cholecystitis. IMPRESSION: Nonvisualization of the gallbladder, suspicious for acute cholecystitis. These results will be called to the ordering clinician or representative by the Radiologist Assistant, and communication documented in the PACS or zVision Dashboard. Electronically Signed   By: Charline Bills M.D.   On: 09/29/2015 15:37   Dg Chest Port 1 View  09/07/2015  CLINICAL DATA:  Pt with SOB, cough since last night Was SOB during exam, especially after exertion Hx DM, COPD, HTN, aortic stenosis, paroxysmal atrial fibrillation, loop recorded implant 2015 EXAM: PORTABLE CHEST - 1 VIEW COMPARISON:  09/05/2015 FINDINGS: Coarse interstitial opacities in both lung bases right worse than left, slightly improved since  previous exam. Calcified granuloma in the left mid lung as before. Implanted loop recorder overlies the cardiac silhouette. Heart size within normal limits for technique. Atheromatous aorta. No pneumothorax or effusion. Visualized skeletal structures are unremarkable. IMPRESSION: 1. Chronic bibasilar interstitial lung disease with marginally improved aeration. Electronically Signed   By: Corlis Leak M.D.   On: 09/07/2015 10:07   Dg Chest Port 1 View  09/03/2015  CLINICAL DATA:  75 year old male with shortness of breath and chest tightness EXAM: PORTABLE CHEST 1 VIEW COMPARISON:  Radiograph dated 05/1915 FINDINGS: Single-view of the chest demonstrate bibasilar linear atelectatic changes/ scarring. No focal consolidation, pleural effusion, or pneumothorax. Stable cardiac silhouette. The osseous structures are grossly unremarkable. IMPRESSION: No active disease. Electronically Signed   By: Elgie Collard M.D.   On: 09/03/2015 21:54   Dg Abd 2 Views  09/29/2015  CLINICAL DATA:  Abdominal pain and bloating. EXAM: ABDOMEN - 2 VIEW COMPARISON:  CT of the abdomen pelvis 09/27/2015 FINDINGS: The bowel gas pattern is nonobstructive/nonspecific. Single air-fluid level containing gas distended structure within the upper mid abdomen likely represents the stomach. There is normal colonic bowel gas pattern. There is no evidence of free air. No radio-opaque calculi or other significant radiographic abnormality is seen. IMPRESSION: Probable distention of the stomach, with nonspecific appearance. Otherwise no evidence of small-bowel obstruction. Electronically Signed   By: Ted Mcalpine M.D.   On: 09/29/2015 19:22   Dg Abd Acute W/chest  09/27/2015  CLINICAL DATA:  Abdominal pain and bloating for several days. EXAM: DG ABDOMEN ACUTE W/ 1V CHEST COMPARISON:  None. FINDINGS: There is no evidence of free intraperitoneal air. There are dilated loops of small bowel in the left upper quadrant measuring up to 5 cm. There is  air within the colon. No radiopaque calculi or other significant radiographic abnormality is seen. Heart size and mediastinal contours are within normal limits. The lungs are hyperinflated likely secondary to COPD. IMPRESSION: Dilated loops of small bowel in the left upper quadrant measuring up to 5 cm with a air in the colon. This may reflect an ileus versus partial small bowel obstruction. Electronically Signed   By: Elige Ko   On: 09/27/2015 17:33   US Abdomen Limited Ruq  09/29/2015  CLINICAL DATA:  Cholelithiasis. EXAM: US ABDOMEN LIMITED - RIGHT UPPER QUADRANT COMPARISON:  September 27, 2015. FINDINGS: Gallbladder: 7 mm stone is seen in neck of gallbladder. No significant gallbladder wall thickening or pericholecystic fluid is noted. No sonographic Murphy's sign is noted. Common bile duct: Diameter: 3.3 mm which is within normal limits. Liver: No  focal lesion identified. Within normal limits in parenchymal echogenicity. Right hydronephrosis is noted as described on prior CT scan. IMPRESSION: Solitary gallstone is noted. No definite evidence of cholecystitis is noted. Right hydronephrosis is again noted as described on prior CT scan. Electronically Signed   By: Lupita Raider, M.D.   On: 09/29/2015 10:39    Microbiology: Recent Results (from the past 240 hour(s))  Urine culture     Status: None   Collection Time: 09/27/15  7:20 PM  Result Value Ref Range Status   Specimen Description URINE, CLEAN CATCH  Final   Special Requests NONE  Final   Culture   Final    MULTIPLE SPECIES PRESENT, SUGGEST RECOLLECTION Performed at [facility]    Report Status 09/29/2015 FINAL  Final  Culture, blood (Routine X 2) w Reflex to ID Panel     Status: None (Preliminary result)   Collection Time: 09/28/15  3:50 AM  Result Value Ref Range Status   Specimen Description BLOOD RIGHT ARM  Final   Special Requests BOTTLES DRAWN AEROBIC AND ANAEROBIC 6CC  Final   Culture   Final    NO GROWTH 3  DAYS Performed at [facility]    Report Status PENDING  Incomplete  Culture, blood (Routine X 2) w Reflex to ID Panel     Status: None (Preliminary result)   Collection Time: 09/28/15  3:55 AM  Result Value Ref Range Status   Specimen Description BLOOD RIGHT HAND  Final   Special Requests IN PEDIATRIC BOTTLE 4CC  Final   Culture   Final    NO GROWTH 3 DAYS Performed at [facility]    Report Status PENDING  Incomplete     Labs: Basic Metabolic Panel:  Recent Labs Lab 09/27/15 1803 09/28/15 0355 09/29/15 0436 09/30/15 0456 10/01/15 0350  NA 138 140 142 143 142  K 4.3 4.6 4.3 3.9 3.7  CL 102 99* 105 100* 101  CO2 30 31 30  33* 31  GLUCOSE 168* 302* 236* 179* 207*  BUN 16 15 17 15 14   CREATININE 0.73 0.89 0.95 0.86 0.79  CALCIUM 8.7* 8.5* 8.4* 8.5* 8.6*   Liver Function Tests:  Recent Labs Lab 09/27/15 1803 09/28/15 0355 09/29/15 0436 09/30/15 0456  AST 13* 12* 16 12*  ALT 23 20 21 21   ALKPHOS 63 61 52 57  BILITOT 0.7 0.7 0.5 0.6  PROT 6.7 6.3* 5.6* 5.5*  ALBUMIN 3.7 3.5 3.1* 3.0*    Recent Labs Lab 09/27/15 1803  LIPASE 25   No results for input(s): AMMONIA in the last 168 hours. CBC:  Recent Labs Lab 09/27/15 1803 09/28/15 0355 09/29/15 0436 09/30/15 0456 10/01/15 0350  WBC 7.2 4.7 5.8 5.1 4.7  NEUTROABS 6.0  --   --  3.9  --   HGB 12.1* 11.5* 11.3* 10.9* 11.2*  HCT 37.7* 35.7* 35.6* 34.2* 34.8*  MCV 91.1 92.0 93.0 92.9 91.3  PLT 105* 100* 102* 104* 124*   Cardiac Enzymes:  Recent Labs Lab 09/27/15 1803  TROPONINI <0.03   BNP: BNP (last 3 results)  Recent Labs  09/03/15 2156 09/27/15 1803  BNP 77.9 72.4    ProBNP (last 3 results) No results for input(s): PROBNP in the last 8760 hours.  CBG:  Recent Labs Lab 10/01/15 0748 10/01/15 1151 10/01/15 1653 10/01/15 2227 10/02/15 0749  GLUCAP 215* 300* 287* 249* 215*   Signed:  Adelita Hone K  [facility] 10/02/2015, 12:08 PM

## 2015-10-03 LAB — CULTURE, BLOOD (ROUTINE X 2)
CULTURE: NO GROWTH
CULTURE: NO GROWTH

## 2015-10-14 LAB — CUP PACEART REMOTE DEVICE CHECK: Date Time Interrogation Session: 20161207073544

## 2015-10-20 ENCOUNTER — Ambulatory Visit (INDEPENDENT_AMBULATORY_CARE_PROVIDER_SITE_OTHER): Payer: PPO | Admitting: *Deleted

## 2015-10-20 DIAGNOSIS — I639 Cerebral infarction, unspecified: Secondary | ICD-10-CM | POA: Diagnosis not present

## 2015-10-21 NOTE — Progress Notes (Signed)
Carelink Summary Report / Loop Recorder 

## 2015-10-31 LAB — CUP PACEART REMOTE DEVICE CHECK: MDC IDC SESS DTM: 20170106073549

## 2015-10-31 NOTE — Progress Notes (Signed)
[facility] summary report received. Battery status OK. Normal device function. No new symptom episodes, tachy episodes, brady, or pause episodes. No new AF episodes. Monthly summary reports and ROV/PRN 

## 2015-11-18 ENCOUNTER — Ambulatory Visit (INDEPENDENT_AMBULATORY_CARE_PROVIDER_SITE_OTHER): Payer: PPO | Admitting: *Deleted

## 2015-11-18 DIAGNOSIS — I639 Cerebral infarction, unspecified: Secondary | ICD-10-CM

## 2015-11-21 NOTE — Progress Notes (Signed)
Carelink Summary Report / Loop Recorder 

## 2015-11-22 LAB — CUP PACEART REMOTE DEVICE CHECK: Date Time Interrogation Session: 20170307080534

## 2015-11-22 NOTE — Progress Notes (Signed)
[facility] summary report received. Battery status OK. Normal device function. No new symptom episodes, tachy episodes, brady, or pause episodes. No new AF episodes. Monthly summary reports and ROV/PRN 

## 2015-12-18 ENCOUNTER — Ambulatory Visit (INDEPENDENT_AMBULATORY_CARE_PROVIDER_SITE_OTHER): Payer: PPO | Admitting: *Deleted

## 2015-12-18 DIAGNOSIS — I639 Cerebral infarction, unspecified: Secondary | ICD-10-CM

## 2015-12-19 NOTE — Progress Notes (Signed)
Carelink Summary Report / Loop Recorder 

## 2015-12-24 ENCOUNTER — Other Ambulatory Visit: Payer: Self-pay

## 2015-12-24 MED ORDER — ATORVASTATIN CALCIUM 20 MG PO TABS: 10.0000 mg | ORAL_TABLET | Freq: Every day | ORAL | Status: AC

## 2016-01-03 LAB — CUP PACEART REMOTE DEVICE CHECK: MDC IDC SESS DTM: 20170205073524

## 2016-01-03 NOTE — Progress Notes (Signed)
[facility] summary report received. Battery status OK. Normal device function. No new symptom episodes, brady, or pause episodes. No new AF episodes. 1 new tachy episode- 8 seconds- EGM appears SVT. Monthly summary reports and ROV/PRN

## 2016-01-05 ENCOUNTER — Other Ambulatory Visit: Payer: Self-pay | Admitting: Internal Medicine

## 2016-01-19 ENCOUNTER — Ambulatory Visit (INDEPENDENT_AMBULATORY_CARE_PROVIDER_SITE_OTHER): Payer: PPO | Admitting: *Deleted

## 2016-01-19 DIAGNOSIS — I639 Cerebral infarction, unspecified: Secondary | ICD-10-CM | POA: Diagnosis not present

## 2016-01-20 NOTE — Progress Notes (Signed)
Carelink Summary Report / Loop Recorder 

## 2016-01-23 ENCOUNTER — Telehealth: Payer: Self-pay | Admitting: Cardiology

## 2016-01-23 NOTE — Telephone Encounter (Signed)
LMOVM requesting that pt send manual transmission b/c home monitor has not updated in at least 14 days.    

## 2016-01-30 ENCOUNTER — Encounter: Payer: Self-pay | Admitting: Cardiology

## 2016-02-07 LAB — CUP PACEART REMOTE DEVICE CHECK: MDC IDC SESS DTM: 20170406080530

## 2016-02-07 NOTE — Progress Notes (Signed)
[facility] summary report received. Battery status OK. Normal device function. No new symptom episodes, tachy episodes, brady, or pause episodes. No new AF episodes. Monthly summary reports and ROV/PRN 

## 2016-02-12 DEATH — deceased

## 2016-02-26 LAB — CUP PACEART REMOTE DEVICE CHECK: MDC IDC SESS DTM: 20170506083539

## 2017-03-10 IMAGING — CT CT ABD-PELV W/O CM
2 of 4 series · 16 of 46 positions shown, 18 images · non-contrast
Comparison: 08/12/2015

CLINICAL DATA: Abdominal distention for 2-3 days.  Pain.

EXAM:
CT ABDOMEN AND PELVIS WITHOUT CONTRAST
TECHNIQUE: Multidetector CT imaging of the abdomen and pelvis was performed
following the standard protocol without IV contrast.

[Series 2: axial st · axial · 0.97mm/px · z∈[-518,-82]mm · 13 of 95 slices shown, 15 images]
[im 4/95  soft-tissue]
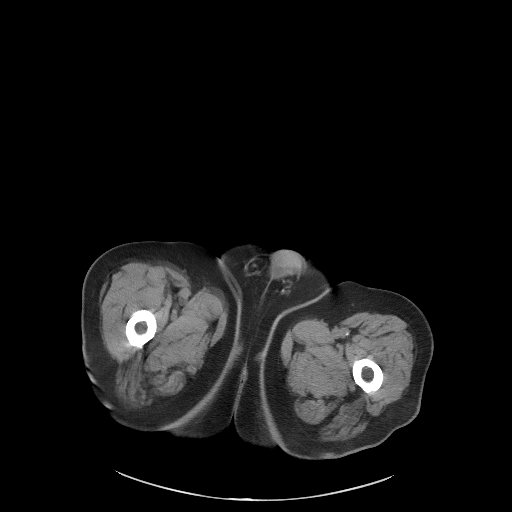
[im 4/95  bone]
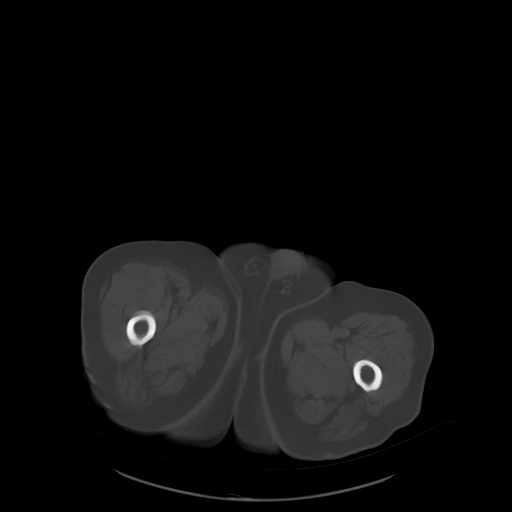
[im 11/95  soft-tissue]
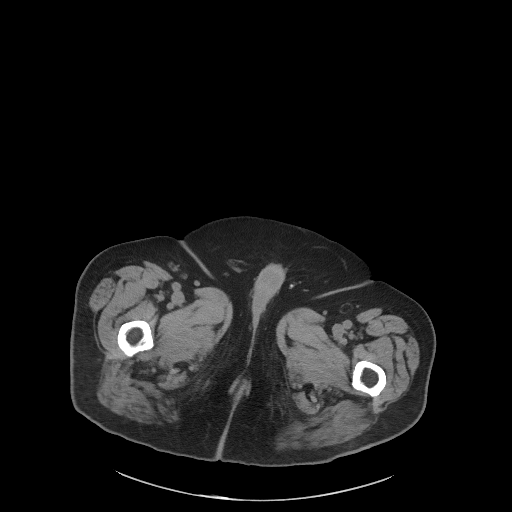
[im 19/95  soft-tissue]
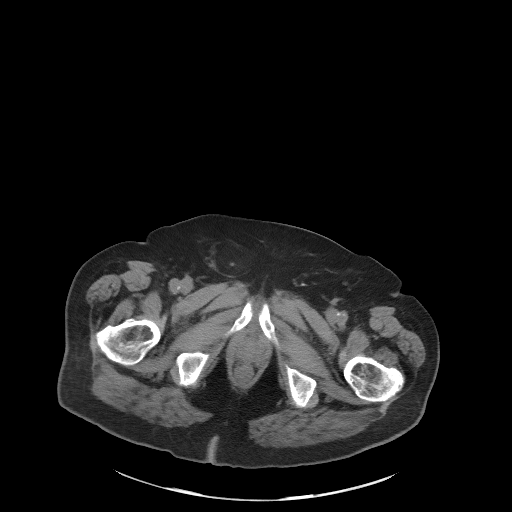
[im 26/95  soft-tissue]
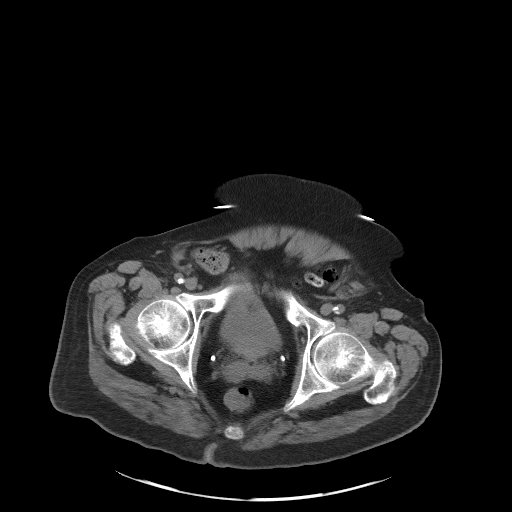
[im 33/95  soft-tissue]
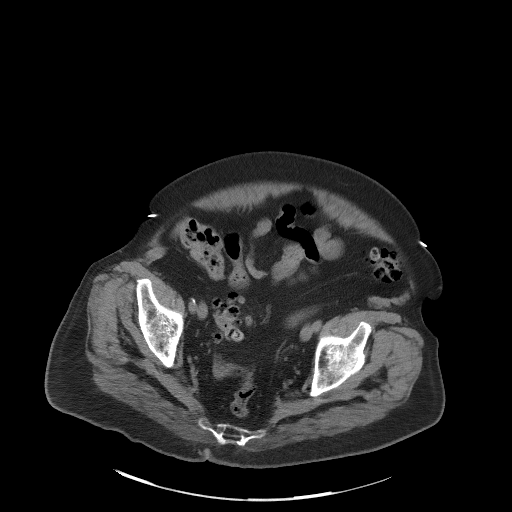
[im 40/95  soft-tissue]
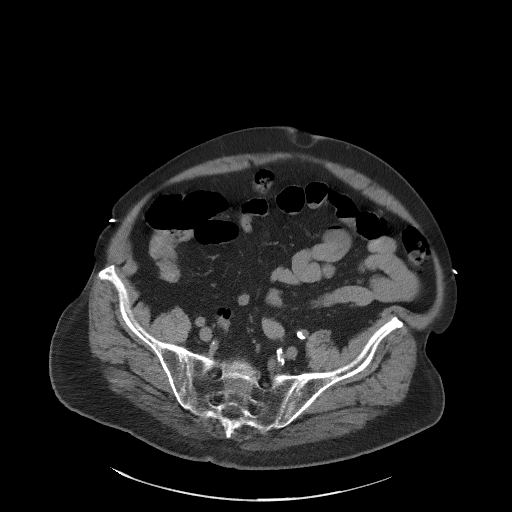
[im 48/95  soft-tissue]
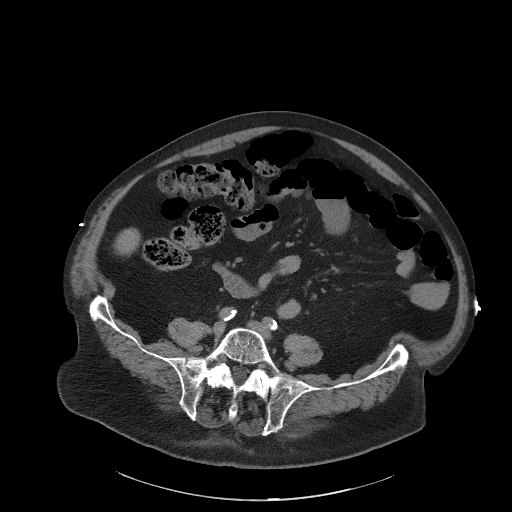
[im 55/95  soft-tissue]
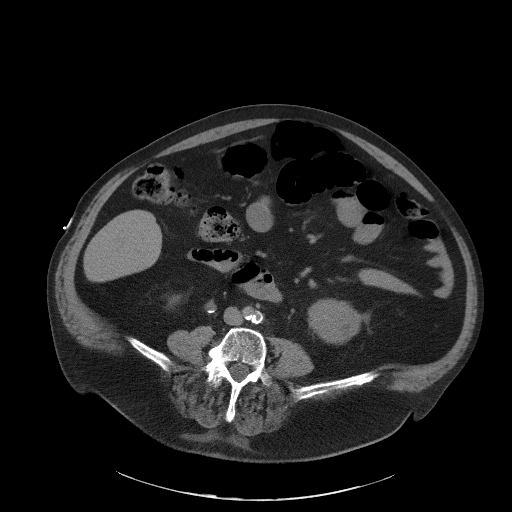
[im 62/95  soft-tissue]
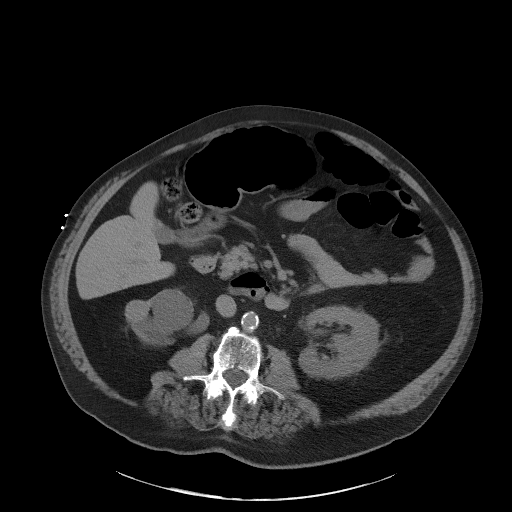
[im 62/95  bone]
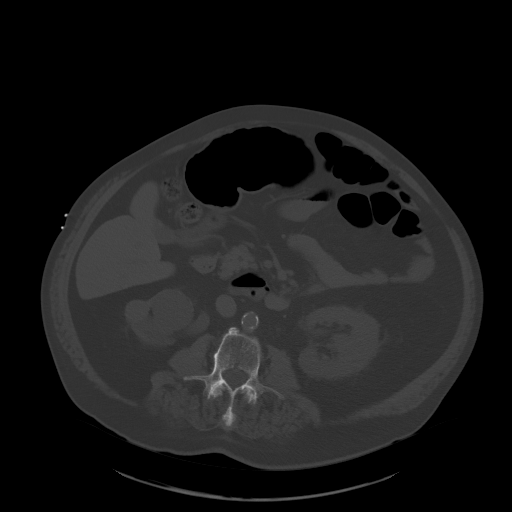
[im 69/95  soft-tissue]
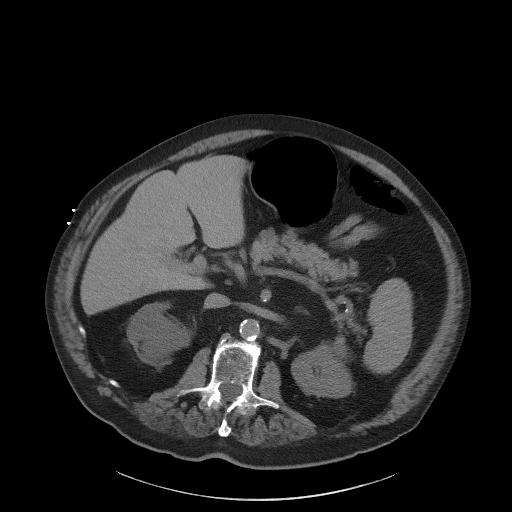
[im 76/95  soft-tissue]
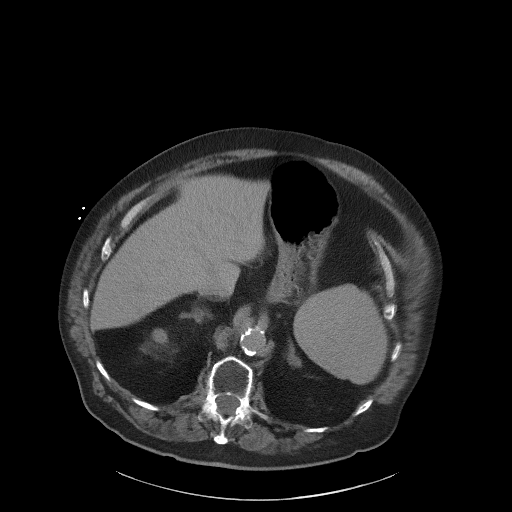
[im 84/95  soft-tissue]
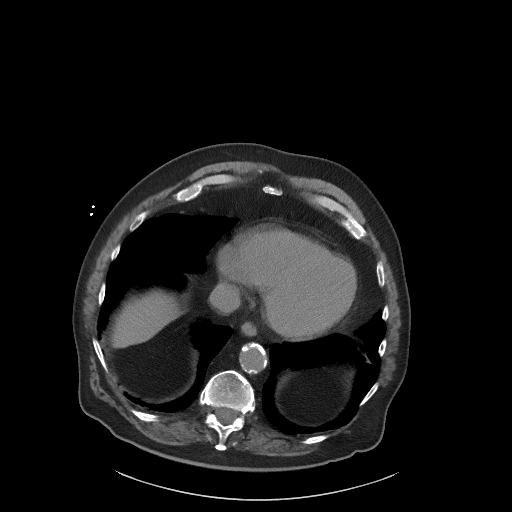
[im 91/95  soft-tissue]
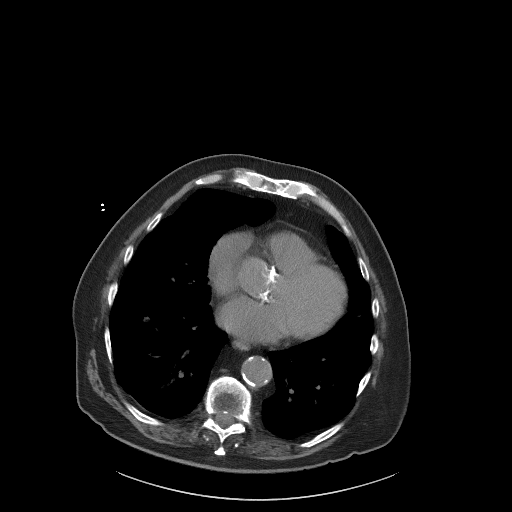

[Series 5: coronal st · coronal · 0.87mm/px · 3 of 114 slices shown]
[im 38/114  soft-tissue]
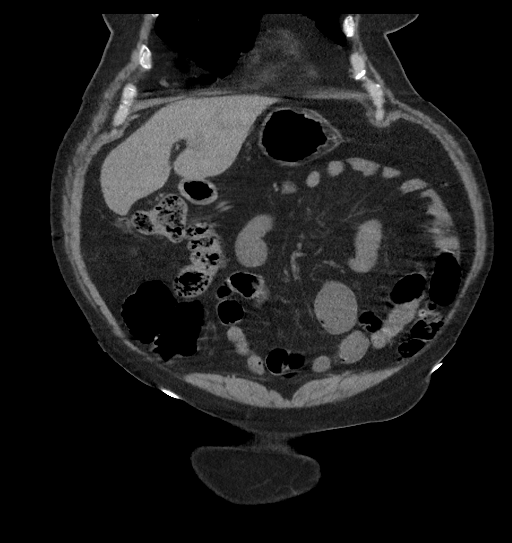
[im 51/114  soft-tissue]
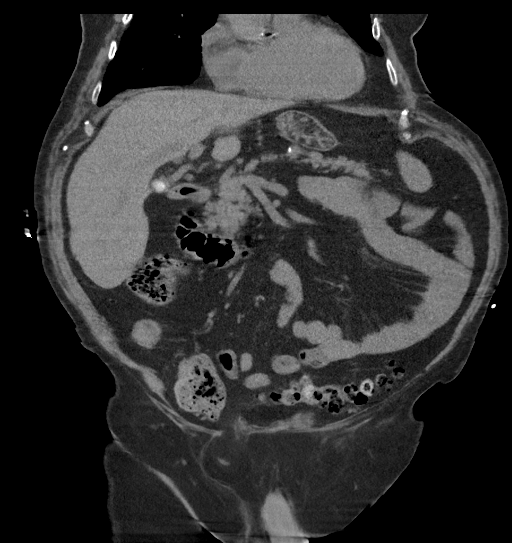
[im 63/114  soft-tissue]
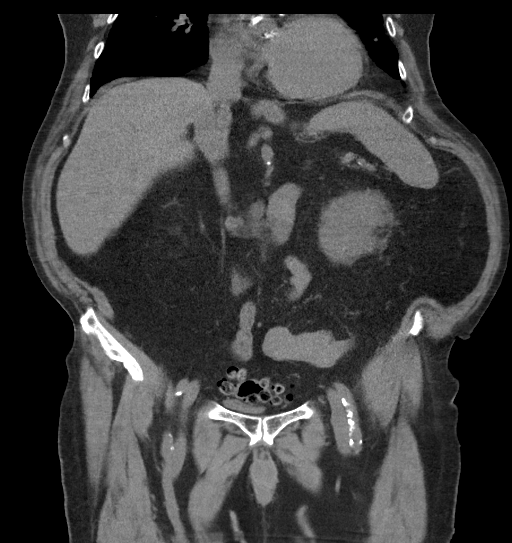

[16 of 46 positions shown; findings below may reference images not displayed]

FINDINGS: Lower chest: Mild bibasilar chronic interstitial disease. Normal
heart size.

Hepatobiliary: Normal liver. Cholelithiasis. No intrahepatic or
extrahepatic biliary ductal dilatation.

Pancreas: Normal.

Spleen: Normal.

Adrenals/Urinary Tract: Normal adrenal glands. Severe right
hydronephrosis with severe right renal cortical thinning. Normal
left kidney. There is a right ureteral calcification measuring
mm in transverse dimension and 2.8 cm in craniocaudal dimension.
Decompressed bladder.

Stomach/Bowel: No bowel wall thickening or dilatation. Fact
containing right inguinal hernia. Diverticulosis without evidence of
diverticulitis. No pneumatosis, pneumoperitoneum or portal venous
gas. Small fat containing umbilical hernia.

Vascular/Lymphatic: Normal caliber abdominal aorta. No
lymphadenopathy.

Other: No fluid collection or hematoma.

Musculoskeletal: No lytic or sclerotic osseous lesion. Ankylosis of
the posterior elements of the thoracolumbar spine and syndesmophytes
of the thoracolumbar spine as can be seen with ankylosing
spondylitis.
IMPRESSION: 1. Severe right hydronephrosis with severe right renal cortical
thinning. There is a right mid ureteral calcification measuring
mm in transverse dimension and 2.8 cm in craniocaudal dimension.
2. Ankylosis of the posterior elements of the thoracolumbar spine
and syndesmophytes of the thoracolumbar spine as can be seen with
ankylosing spondylitis.
3. Cholelithiasis.

## 2017-03-10 IMAGING — DX DG ABDOMEN ACUTE W/ 1V CHEST
3 series · 3 of 3 positions shown · non-contrast
Comparison: None.

CLINICAL DATA: Abdominal pain and bloating for several days.

EXAM:
DG ABDOMEN ACUTE W/ 1V CHEST

[chest pa]
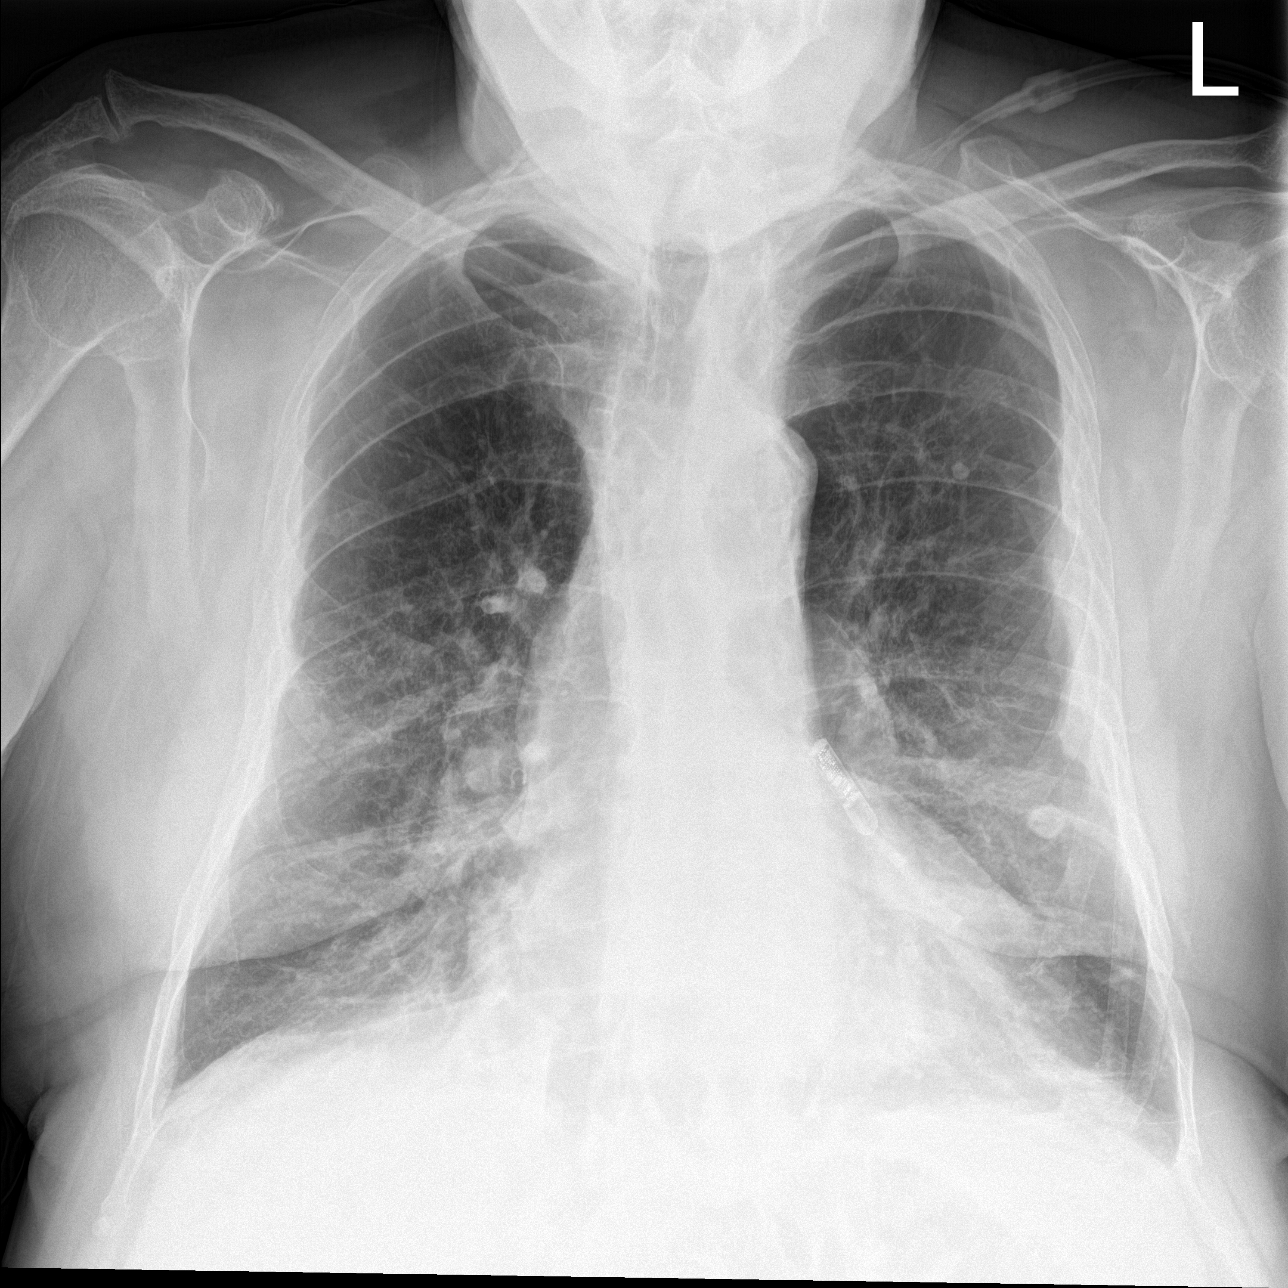

[abdomen erect]
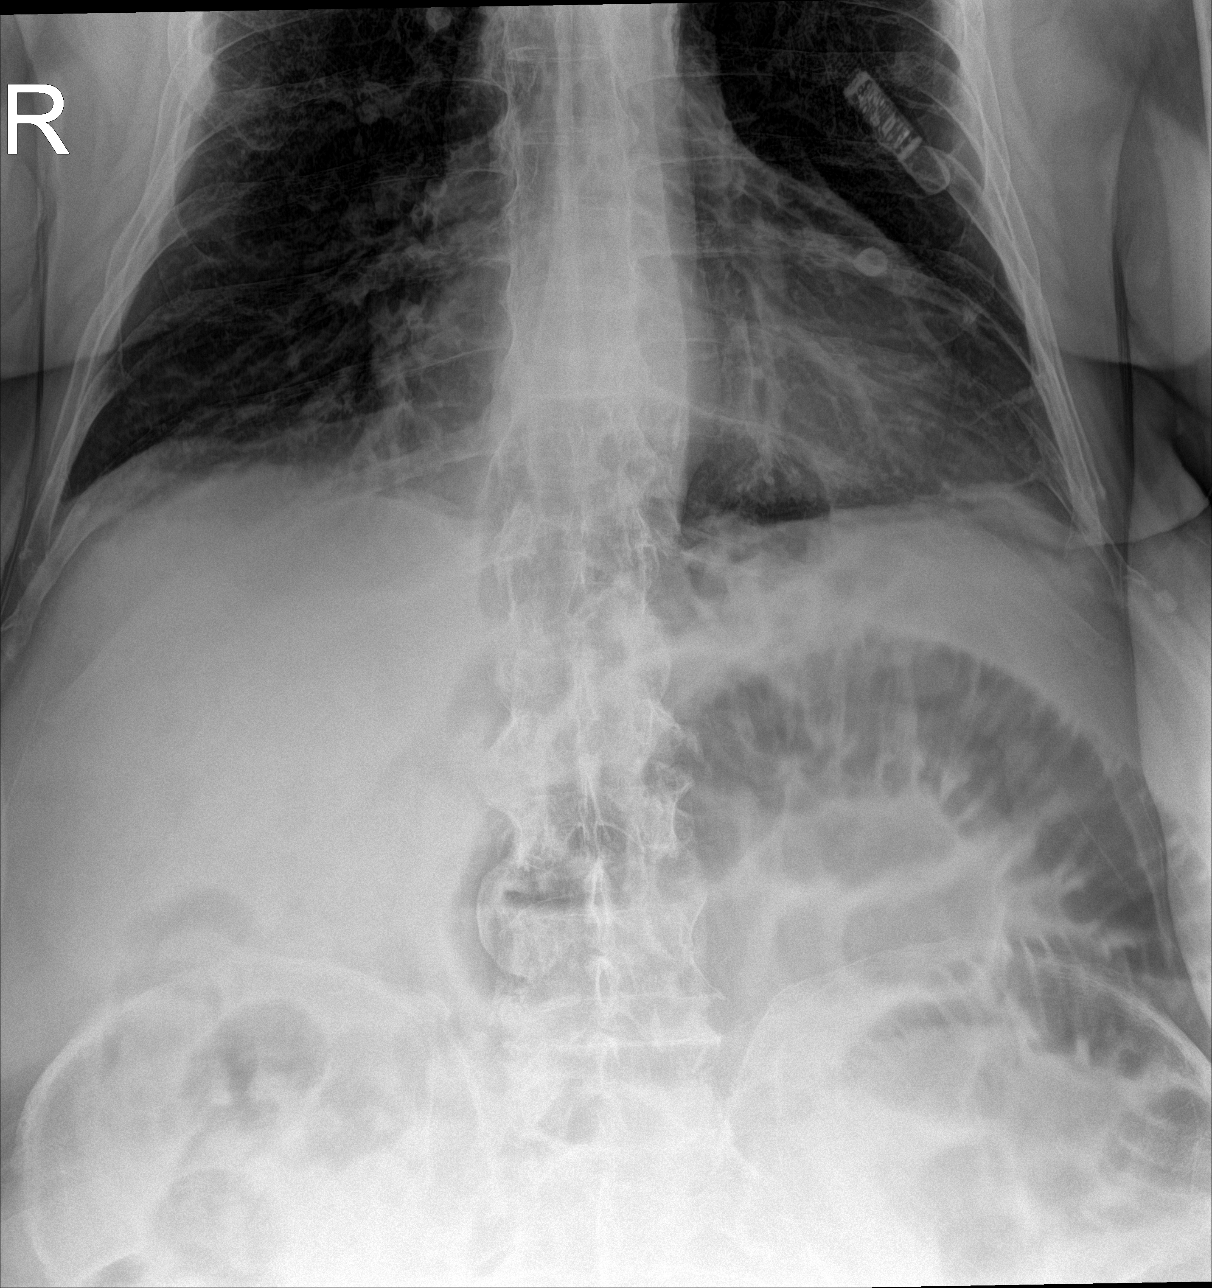

[abdomen supine]
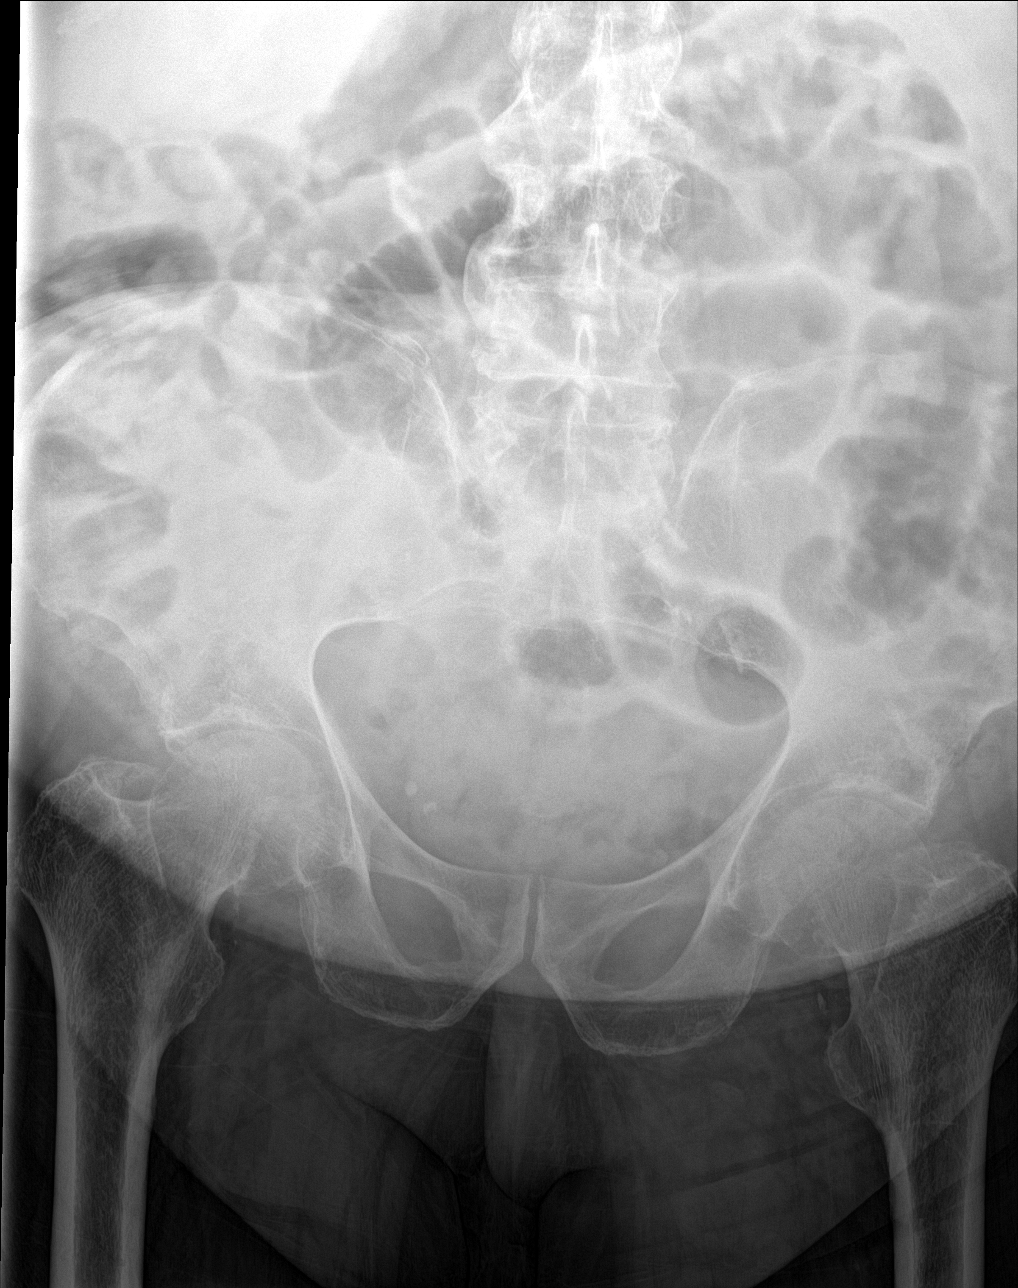

[3 of 3 positions shown; findings below may reference images not displayed]

FINDINGS: There is no evidence of free intraperitoneal air. There are dilated
loops of small bowel in the left upper quadrant measuring up to 5
cm. There is air within the colon. No radiopaque calculi or other
significant radiographic abnormality is seen. Heart size and
mediastinal contours are within normal limits. The lungs are
hyperinflated likely secondary to COPD.
IMPRESSION: Dilated loops of small bowel in the left upper quadrant measuring up
to 5 cm with a air in the colon. This may reflect an ileus versus
partial small bowel obstruction.
